# Patient Record
Sex: Male | Born: 1975
Health system: Southern US, Community
[De-identification: ages and names within clinical notes are randomized; demographics above are authoritative.]

## PROBLEM LIST (undated history)

## (undated) DIAGNOSIS — F1910 Other psychoactive substance abuse, uncomplicated: Secondary | ICD-10-CM

## (undated) DIAGNOSIS — I10 Essential (primary) hypertension: Secondary | ICD-10-CM

## (undated) DIAGNOSIS — H3320 Serous retinal detachment, unspecified eye: Secondary | ICD-10-CM

## (undated) DIAGNOSIS — Z9109 Other allergy status, other than to drugs and biological substances: Secondary | ICD-10-CM

## (undated) DIAGNOSIS — L0390 Cellulitis, unspecified: Secondary | ICD-10-CM

## (undated) DIAGNOSIS — E7800 Pure hypercholesterolemia, unspecified: Secondary | ICD-10-CM

## (undated) DIAGNOSIS — K449 Diaphragmatic hernia without obstruction or gangrene: Secondary | ICD-10-CM

## (undated) DIAGNOSIS — M7580 Other shoulder lesions, unspecified shoulder: Secondary | ICD-10-CM

## (undated) HISTORY — PX: EYE SURGERY: SHX253

## (undated) HISTORY — PX: HERNIA REPAIR: SHX51

---

## 2001-07-01 ENCOUNTER — Encounter: Admission: RE | Admit: 2001-07-01 | Discharge: 2001-07-01 | Payer: Self-pay | Admitting: *Deleted

## 2001-07-01 ENCOUNTER — Encounter: Payer: Self-pay | Admitting: *Deleted

## 2014-08-05 ENCOUNTER — Encounter (HOSPITAL_COMMUNITY): Payer: Self-pay | Admitting: *Deleted

## 2014-08-05 ENCOUNTER — Emergency Department (HOSPITAL_COMMUNITY)
Admission: EM | Admit: 2014-08-05 | Discharge: 2014-08-05 | Disposition: A | Discharge status: Home / Self Care | Payer: 59 | Attending: Emergency Medicine | Admitting: Emergency Medicine

## 2014-08-05 DIAGNOSIS — I10 Essential (primary) hypertension: Secondary | ICD-10-CM | POA: Diagnosis not present

## 2014-08-05 DIAGNOSIS — Z8739 Personal history of other diseases of the musculoskeletal system and connective tissue: Secondary | ICD-10-CM | POA: Diagnosis not present

## 2014-08-05 DIAGNOSIS — Z79899 Other long term (current) drug therapy: Secondary | ICD-10-CM | POA: Diagnosis not present

## 2014-08-05 DIAGNOSIS — R42 Dizziness and giddiness: Secondary | ICD-10-CM | POA: Diagnosis present

## 2014-08-05 DIAGNOSIS — E780 Pure hypercholesterolemia: Secondary | ICD-10-CM | POA: Diagnosis not present

## 2014-08-05 DIAGNOSIS — Z7951 Long term (current) use of inhaled steroids: Secondary | ICD-10-CM | POA: Insufficient documentation

## 2014-08-05 DIAGNOSIS — Z7982 Long term (current) use of aspirin: Secondary | ICD-10-CM | POA: Insufficient documentation

## 2014-08-05 DIAGNOSIS — Z8719 Personal history of other diseases of the digestive system: Secondary | ICD-10-CM | POA: Diagnosis not present

## 2014-08-05 HISTORY — DX: Other allergy status, other than to drugs and biological substances: Z91.09

## 2014-08-05 HISTORY — DX: Diaphragmatic hernia without obstruction or gangrene: K44.9

## 2014-08-05 HISTORY — DX: Essential (primary) hypertension: I10

## 2014-08-05 HISTORY — DX: Other shoulder lesions, unspecified shoulder: M75.80

## 2014-08-05 HISTORY — DX: Pure hypercholesterolemia, unspecified: E78.00

## 2014-08-05 LAB — BASIC METABOLIC PANEL
Anion gap: 9 (ref 5–15)
BUN: 13 mg/dL (ref 6–20)
CALCIUM: 9.4 mg/dL (ref 8.9–10.3)
CO2: 26 mmol/L (ref 22–32)
CREATININE: 0.85 mg/dL (ref 0.61–1.24)
Chloride: 104 mmol/L (ref 101–111)
GLUCOSE: 93 mg/dL (ref 65–99)
Potassium: 4 mmol/L (ref 3.5–5.1)
Sodium: 139 mmol/L (ref 135–145)

## 2014-08-05 LAB — CBC
HCT: 42.9 % (ref 39.0–52.0)
HEMOGLOBIN: 15.2 g/dL (ref 13.0–17.0)
MCH: 28.4 pg (ref 26.0–34.0)
MCHC: 35.4 g/dL (ref 30.0–36.0)
MCV: 80.2 fL (ref 78.0–100.0)
Platelets: 231 10*3/uL (ref 150–400)
RBC: 5.35 MIL/uL (ref 4.22–5.81)
RDW: 13.2 % (ref 11.5–15.5)
WBC: 7.6 10*3/uL (ref 4.0–10.5)

## 2014-08-05 LAB — I-STAT TROPONIN, ED: Troponin i, poc: 0 ng/mL (ref 0.00–0.08)

## 2014-08-05 NOTE — ED Notes (Signed)
Pt. Left with all belongings and refused wheelchair 

## 2014-08-05 NOTE — Discharge Instructions (Signed)
EKG here without any significant maladies. Patient's dizziness is improved significantly. Labs without any significant abnormalities. Recommend following up with your regular doctor for blood pressure checks. Return for any new or worse symptoms at all.

## 2014-08-05 NOTE — ED Provider Notes (Signed)
CSN: [id number]     Arrival date & time 08/05/14  2002 History   First MD Initiated Contact with Patient 08/05/14 2057     No chief complaint on file.    (Consider location/radiation/quality/duration/timing/severity/associated sxs/prior Treatment) The history is provided by the patient.   patient with history of hypertension. Strong cardiac history in the family. Patient today was experiencing some dizziness no vertigo. Currently denying your pain here. A little bit of pressure sensation in the face. Is suffering from seasonal allergies. Noted that his blood pressure was running high at home checked several times. Went to urgent care. They felt that the pressure in the face was maybe related allergies. He didn't EKG showed a flipped T-wave in lead 3 and patient was referred here for an abnormal EKG. Patient has no chest pain. Patient overall feels much better currently. Patient's blood pressure is improving. No chest or cardiac symptoms at all no shortness of breath.  Past Medical History  Diagnosis Date  . Hiatal hernia   . Hypertension   . Hypercholesteremia   . Environmental allergies   . AC (acromioclavicular) joint bone spurs    Past Surgical History  Procedure Laterality Date  . Hernia repair     No family history on file. History  Substance Use Topics  . Smoking status: Never Smoker   . Smokeless tobacco: Not on file  . Alcohol Use: Yes     Comment: once per week    Review of Systems  Constitutional: Negative for fever.  HENT: Positive for congestion.   Eyes: Negative for visual disturbance.  Respiratory: Negative for shortness of breath.   Cardiovascular: Negative for chest pain.  Gastrointestinal: Negative for nausea, vomiting, abdominal pain and diarrhea.  Genitourinary: Negative for dysuria.  Musculoskeletal: Negative for back pain and neck pain.  Skin: Negative for rash.  Neurological: Positive for dizziness. Negative for facial asymmetry, speech difficulty,  weakness, numbness and headaches.  Hematological: Does not bruise/bleed easily.  Psychiatric/Behavioral: Negative for confusion.      Allergies  Review of patient's allergies indicates no known allergies.  Home Medications   Prior to Admission medications   Medication Sig Start Date End Date Taking? Authorizing Provider  aspirin EC 81 MG tablet Take 81 mg by mouth daily.   Yes Historical Provider, MD  fexofenadine (ALLEGRA) 180 MG tablet Take 180 mg by mouth daily.   Yes Historical Provider, MD  fluticasone (FLONASE) 50 MCG/ACT nasal spray Place 1 spray into both nostrils 2 (two) times daily.   Yes Historical Provider, MD  Multiple Vitamin (MULTI-VITAMIN PO) Take 1 tablet by mouth daily.   Yes Historical Provider, MD  sertraline (ZOLOFT) 100 MG tablet Take 150 mg by mouth daily.   Yes Historical Provider, MD  simvastatin (ZOCOR) 20 MG tablet Take 20 mg by mouth daily.   Yes Historical Provider, MD  traMADol (ULTRAM-ER) 100 MG 24 hr tablet Take 50 mg by mouth 2 (two) times daily.   Yes Historical Provider, MD  valsartan (DIOVAN) 160 MG tablet Take 160 mg by mouth daily.   Yes Historical Provider, MD   BP 141/89 mmHg  Pulse 83  Temp(Src) 98.2 F (36.8 C) (Oral)  Resp 13  SpO2 99% Physical Exam  Constitutional: He is oriented to person, place, and time. He appears well-developed and well-nourished. No distress.  HENT:  Head: Normocephalic and atraumatic.  Mouth/Throat: Oropharynx is clear and moist.  Eyes: Conjunctivae and EOM are normal. Pupils are equal, round, and reactive to light.  Neck: Normal range of motion.  Cardiovascular: Normal rate, regular rhythm and normal heart sounds.   No murmur heard. Pulmonary/Chest: Effort normal and breath sounds normal. No respiratory distress.  Abdominal: Soft. Bowel sounds are normal. There is no tenderness.  Musculoskeletal: Normal range of motion. He exhibits no edema.  Neurological: He is alert and oriented to person, place, and time.  No cranial nerve deficit. He exhibits normal muscle tone. Coordination normal.  Skin: Skin is warm. No rash noted.  Nursing note and vitals reviewed.   ED Course  Procedures (including critical care time) Labs Review Labs Reviewed  CBC  BASIC METABOLIC PANEL  I-STAT TROPOININ, ED    Imaging Review No results found.   EKG Interpretation   Date/Time:  Wednesday August 05 2014 20:07:18 EDT Ventricular Rate:  79 PR Interval:  154 QRS Duration: 98 QT Interval:  380 QTC Calculation: 435 R Axis:   66 Text Interpretation:  Normal sinus rhythm Normal ECG No previous ECGs  available Confirmed by Nolawi Kanady  MD, Trevonte Ashkar [phone]([id number]) on 08/05/2014 9:10:19 PM      MDM   Final diagnoses:  Essential hypertension  Dizziness    [facility] for concerns on EKG. Patient has a known history of hypertension does have a significant family cardiac history. Patient's blood pressure was running higher today patient was checking it frequently. Most recent blood pressure here actually had a systolic below 140 and diastolic below 90. Patient without any vertigo symptoms. EKG here is normal. Troponin is normal labs are normal. Patient without any significant headache or chest pain. Earlier today patient had some dizziness without vertigo had A funny feeling in the face and had similar to when his blood pressure was high prior to being treated. Denies any chest pain. Patient can follow-up with his regular doctor. Patient given precautions and will return for any development of any new or worse symptoms including chest pain.    Vanetta MuldersScott Naif Alabi, MD 08/05/14 2158

## 2014-08-05 NOTE — ED Notes (Signed)
pt states he was at work and started experiencing dizzy, ear pain. States his BP was 133/96. Checked his BP again and it was 124/101. Pt went to UC. Was told the dizziness was allergy related and had fluid on his ears. UC did and EKG and noted some EKG changes and was sent to ED for further eval. Denies CP.pt states that he was exercising today and as his HR went up his head started hurting as well.

## 2015-01-27 ENCOUNTER — Other Ambulatory Visit: Payer: Self-pay | Admitting: Neurology

## 2015-01-27 MED ORDER — FLUTICASONE PROPIONATE 50 MCG/ACT NA SUSP: 1.0000 | Freq: Two times a day (BID) | NASAL | Status: DC

## 2015-09-24 ENCOUNTER — Other Ambulatory Visit: Payer: Self-pay | Admitting: Allergy and Immunology

## 2015-12-22 ENCOUNTER — Encounter (HOSPITAL_COMMUNITY): Payer: Self-pay | Admitting: Emergency Medicine

## 2015-12-22 DIAGNOSIS — Z7982 Long term (current) use of aspirin: Secondary | ICD-10-CM | POA: Diagnosis not present

## 2015-12-22 DIAGNOSIS — I10 Essential (primary) hypertension: Secondary | ICD-10-CM | POA: Diagnosis present

## 2015-12-22 NOTE — ED Triage Notes (Signed)
Pt states he has a history of htn and is on diavan for same. Pt states he woke up yesterday not feeling well and when he went to the doctor today he still felt bad with an elevated bp. Pt states his doctor upped his medicine and sent him home. Pt states he continued to check his bp throughout the day and his pressure is still elevated. Pt went to lay done when he rechecked his pressure and when it was elevated he called 9-1-1.

## 2015-12-23 ENCOUNTER — Emergency Department (HOSPITAL_COMMUNITY)
Admission: EM | Admit: 2015-12-23 | Discharge: 2015-12-23 | Disposition: A | Discharge status: Home / Self Care | Payer: 59 | Attending: Emergency Medicine | Admitting: Emergency Medicine

## 2015-12-23 ENCOUNTER — Emergency Department (HOSPITAL_COMMUNITY): Payer: 59

## 2015-12-23 DIAGNOSIS — I10 Essential (primary) hypertension: Secondary | ICD-10-CM

## 2015-12-23 LAB — CBC WITH DIFFERENTIAL/PLATELET
Basophils Absolute: 0.1 10*3/uL (ref 0.0–0.1)
Basophils Relative: 1 %
Eosinophils Absolute: 0.1 10*3/uL (ref 0.0–0.7)
Eosinophils Relative: 1 %
HEMATOCRIT: 44.7 % (ref 39.0–52.0)
HEMOGLOBIN: 15.4 g/dL (ref 13.0–17.0)
LYMPHS ABS: 2.2 10*3/uL (ref 0.7–4.0)
LYMPHS PCT: 29 %
MCH: 29.7 pg (ref 26.0–34.0)
MCHC: 34.5 g/dL (ref 30.0–36.0)
MCV: 86.3 fL (ref 78.0–100.0)
Monocytes Absolute: 0.9 10*3/uL (ref 0.1–1.0)
Monocytes Relative: 11 %
NEUTROS ABS: 4.4 10*3/uL (ref 1.7–7.7)
NEUTROS PCT: 58 %
Platelets: 236 10*3/uL (ref 150–400)
RBC: 5.18 MIL/uL (ref 4.22–5.81)
RDW: 14.6 % (ref 11.5–15.5)
WBC: 7.6 10*3/uL (ref 4.0–10.5)

## 2015-12-23 LAB — BASIC METABOLIC PANEL
Anion gap: 8 (ref 5–15)
BUN: 14 mg/dL (ref 6–20)
CHLORIDE: 104 mmol/L (ref 101–111)
CO2: 25 mmol/L (ref 22–32)
Calcium: 9.1 mg/dL (ref 8.9–10.3)
Creatinine, Ser: 0.87 mg/dL (ref 0.61–1.24)
GFR calc Af Amer: >60 mL/min (ref >60)
GFR calc non Af Amer: >60 mL/min (ref >60)
Glucose, Bld: 128 mg/dL — ABNORMAL HIGH (ref 65–99)
POTASSIUM: 3.4 mmol/L — AB (ref 3.5–5.1)
SODIUM: 137 mmol/L (ref 135–145)

## 2015-12-23 MED ORDER — SODIUM CHLORIDE 0.9 % IV BOLUS (SEPSIS)
1000.0000 mL | Freq: Once | INTRAVENOUS | Status: AC
  Administered 2015-12-23: 1000 mL via INTRAVENOUS

## 2015-12-23 NOTE — ED Provider Notes (Signed)
[facility] Provider Note   CSN: [id number] Arrival date & time: 12/22/15  2306  By signing my name below, I, [name], attest that this documentation has been prepared under the direction and in the presence of [name], MD . Electronically Signed: [name], Scribe. 12/23/2015. 3:54 AM.  History   Chief Complaint Chief Complaint  Patient presents with  . Hypertension    The history is provided by the patient. No language interpreter was used.     HPI Comments:  Logan Fisher is a 40 y.o. male who presents to the Emergency Department complaining of elevated BP x a few days. Pt has a h/o HTN that has been well controlled with Diovan for the last 6 years. His Diovan dose was increased recently due to this elevation but he notes he has not yet started taking the new dosage but has been compliant with the old dose. Pt  reports  2 instances of BP readings of 171/110 and several other readings over 155 systolic. Pt reports associated chest tightness when he feels anxious, lightheadedness, and nausea. He denies SOB. Pt has a FHx of MI- father age 52. He is also currently taking Adderall; denies recent change in dose.   Past Medical History:  Diagnosis Date  . AC (acromioclavicular) joint bone spurs   . Environmental allergies   . Hiatal hernia   . Hypercholesteremia   . Hypertension     There are no active problems to display for this patient.   Past Surgical History:  Procedure Laterality Date  . HERNIA REPAIR         Home Medications    Prior to Admission medications   Medication Sig Start Date End Date Taking? Authorizing Provider  aspirin EC 81 MG tablet Take 81 mg by mouth daily.    Historical Provider, MD  fexofenadine (ALLEGRA) 180 MG tablet Take 180 mg by mouth daily.    Historical Provider, MD  fluticasone (FLONASE) 50 MCG/ACT nasal spray Place 1 spray into both nostrils 2 (two) times daily. 01/27/15   Jessica Priest, MD  Multiple Vitamin  (MULTI-VITAMIN PO) Take 1 tablet by mouth daily.    Historical Provider, MD  sertraline (ZOLOFT) 100 MG tablet Take 150 mg by mouth daily.    Historical Provider, MD  simvastatin (ZOCOR) 20 MG tablet Take 20 mg by mouth daily.    Historical Provider, MD  traMADol (ULTRAM-ER) 100 MG 24 hr tablet Take 50 mg by mouth 2 (two) times daily.    Historical Provider, MD  valsartan (DIOVAN) 160 MG tablet Take 160 mg by mouth daily.    Historical Provider, MD    Family History No family history on file.  Social History Social History  Substance Use Topics  . Smoking status: Never Smoker  . Smokeless tobacco: Never Used  . Alcohol use 8.4 oz/week    14 Cans of beer per week     Allergies   Review of patient's allergies indicates no known allergies.   Review of Systems Review of Systems 10 systems reviewed and all are negative for acute change except as noted in the HPI.   Physical Exam Updated Vital Signs BP (!) 150/108 (BP Location: Left Arm)   Pulse 92   Temp 98 F (36.7 C) (Oral)   Resp 19   Ht 6\' 5"  (1.956 m)   Wt 221 lb (100.2 kg)   SpO2 96%   BMI 26.21 kg/m   Physical Exam  Constitutional: He is oriented  to person, place, and time. Vital signs are normal. He appears well-developed and well-nourished.  Non-toxic appearance. He does not appear ill. No distress.  HENT:  Head: Normocephalic and atraumatic.  Nose: Nose normal.  Mouth/Throat: Oropharynx is clear and moist. No oropharyngeal exudate.  Eyes: Conjunctivae and EOM are normal. Pupils are equal, round, and reactive to light. No scleral icterus.  Neck: Normal range of motion. Neck supple. No tracheal deviation, no edema, no erythema and normal range of motion present. No thyroid mass and no thyromegaly present.  Cardiovascular: Normal rate, regular rhythm, S1 normal, S2 normal, normal heart sounds, intact distal pulses and normal pulses.  Exam reveals no gallop and no friction rub.   No murmur heard. Pulmonary/Chest:  Effort normal and breath sounds normal. No respiratory distress. He has no wheezes. He has no rhonchi. He has no rales.  Abdominal: Soft. Normal appearance and bowel sounds are normal. He exhibits no distension, no ascites and no mass. There is no hepatosplenomegaly. There is no tenderness. There is no rebound, no guarding and no CVA tenderness.  Musculoskeletal: Normal range of motion. He exhibits no edema or tenderness.  Lymphadenopathy:    He has no cervical adenopathy.  Neurological: He is alert and oriented to person, place, and time. He has normal strength. No cranial nerve deficit or sensory deficit.  Skin: Skin is warm, dry and intact. No petechiae and no rash noted. He is not diaphoretic. No erythema. No pallor.  Nursing note and vitals reviewed.    ED Treatments / Results  DIAGNOSTIC STUDIES:  Oxygen Saturation is 96% on RA, normal by my interpretation.    COORDINATION OF CARE:  3:53 AM Discussed treatment plan with pt at bedside and pt agreed to plan.  Labs (all labs ordered are listed, but only abnormal results are displayed) Labs Reviewed - No data to display  EKG  EKG Interpretation None       Radiology No results found.  Procedures Procedures (including critical care time)  Medications Ordered in ED Medications - No data to display   Initial Impression / Assessment and Plan / ED Course  I have reviewed the triage vital signs and the nursing notes.  Pertinent labs & imaging results that were available during my care of the patient were reviewed by me and considered in my medical decision making (see chart for details).  Clinical Course    Patient presents to the ED for HTN.  He had a mild episode of CP as well, but history is not consistent with ACS.  EKG is unremarkable.  CXR normal as well.  Patient given IVF and advised to continue his increased dose of home medication.  He admits to poor diet and exercise, he also has gained weight over the years,  likely contributing to elevated BP.  He may need further medication control.  Advised to see PCP within 3 days for close follow up. He appears well and in NAD. VS remain within his normal limits and he is safe for DC.    Final Clinical Impressions(s) / ED Diagnoses   Final diagnoses:  None    New Prescriptions New Prescriptions   No medications on file     I personally performed the services described in this documentation, which was scribed in my presence. The recorded information has been reviewed and is accurate.      Tomasita CrumbleAdeleke Genesis Novosad, MD 12/23/15 [phone]

## 2015-12-23 NOTE — ED Notes (Signed)
Pt provided with d/c instructions at this time. Pt verbalizes understanding of d/c instructions as well as follow up procedure after d/c. No new RX at time of d/c.  Pt in no apparent distress at this time. Pt ambulatory at time of d/c.   

## 2015-12-23 NOTE — ED Notes (Signed)
Pt reports a hx of HTN that had been well controlled on diovan.  Pt reports that he has had elevated bp for the last several days. Pt states that his bp was 170/113 when he called EMS.

## 2016-08-05 ENCOUNTER — Emergency Department (HOSPITAL_COMMUNITY)
Admission: EM | Admit: 2016-08-05 | Discharge: 2016-08-05 | Disposition: A | Discharge status: Home / Self Care | Payer: 59 | Attending: Emergency Medicine | Admitting: Emergency Medicine

## 2016-08-05 ENCOUNTER — Encounter (HOSPITAL_COMMUNITY): Payer: Self-pay

## 2016-08-05 DIAGNOSIS — E7800 Pure hypercholesterolemia, unspecified: Secondary | ICD-10-CM | POA: Diagnosis not present

## 2016-08-05 DIAGNOSIS — Z79899 Other long term (current) drug therapy: Secondary | ICD-10-CM | POA: Insufficient documentation

## 2016-08-05 DIAGNOSIS — I10 Essential (primary) hypertension: Secondary | ICD-10-CM | POA: Insufficient documentation

## 2016-08-05 DIAGNOSIS — N481 Balanitis: Secondary | ICD-10-CM | POA: Diagnosis not present

## 2016-08-05 DIAGNOSIS — N489 Disorder of penis, unspecified: Secondary | ICD-10-CM | POA: Diagnosis present

## 2016-08-05 DIAGNOSIS — N4889 Other specified disorders of penis: Secondary | ICD-10-CM

## 2016-08-05 LAB — CBC WITH DIFFERENTIAL/PLATELET
Basophils Absolute: 0 10*3/uL (ref 0.0–0.1)
Basophils Relative: 0 %
EOS ABS: 0 10*3/uL (ref 0.0–0.7)
EOS PCT: 0 %
HCT: 41.4 % (ref 39.0–52.0)
HEMOGLOBIN: 14.1 g/dL (ref 13.0–17.0)
LYMPHS ABS: 1.2 10*3/uL (ref 0.7–4.0)
LYMPHS PCT: 7 %
MCH: 29.6 pg (ref 26.0–34.0)
MCHC: 34.1 g/dL (ref 30.0–36.0)
MCV: 86.8 fL (ref 78.0–100.0)
Monocytes Absolute: 1.5 10*3/uL — ABNORMAL HIGH (ref 0.1–1.0)
Monocytes Relative: 9 %
NEUTROS PCT: 84 %
Neutro Abs: 14.3 10*3/uL — ABNORMAL HIGH (ref 1.7–7.7)
Platelets: 237 10*3/uL (ref 150–400)
RBC: 4.77 MIL/uL (ref 4.22–5.81)
RDW: 13 % (ref 11.5–15.5)
WBC: 17.1 10*3/uL — ABNORMAL HIGH (ref 4.0–10.5)

## 2016-08-05 LAB — COMPREHENSIVE METABOLIC PANEL
ALK PHOS: 54 U/L (ref 38–126)
ALT: 33 U/L (ref 17–63)
AST: 24 U/L (ref 15–41)
Albumin: 3.6 g/dL (ref 3.5–5.0)
Anion gap: 10 (ref 5–15)
BUN: 19 mg/dL (ref 6–20)
CALCIUM: 9.3 mg/dL (ref 8.9–10.3)
CO2: 25 mmol/L (ref 22–32)
CREATININE: 0.93 mg/dL (ref 0.61–1.24)
Chloride: 100 mmol/L — ABNORMAL LOW (ref 101–111)
GFR calc Af Amer: >60 mL/min (ref >60)
GFR calc non Af Amer: >60 mL/min (ref >60)
Glucose, Bld: 147 mg/dL — ABNORMAL HIGH (ref 65–99)
Potassium: 3.5 mmol/L (ref 3.5–5.1)
Sodium: 135 mmol/L (ref 135–145)
Total Bilirubin: 0.8 mg/dL (ref 0.3–1.2)
Total Protein: 6.6 g/dL (ref 6.5–8.1)

## 2016-08-05 LAB — I-STAT CG4 LACTIC ACID, ED: Lactic Acid, Venous: 1.62 mmol/L (ref 0.5–1.9)

## 2016-08-05 MED ORDER — HYDROCODONE-ACETAMINOPHEN 5-325 MG PO TABS: 1.0000 | ORAL_TABLET | Freq: Four times a day (QID) | ORAL | 0 refills | Status: DC | PRN

## 2016-08-05 MED ORDER — SODIUM CHLORIDE 0.9 % IV BOLUS (SEPSIS)
1000.0000 mL | Freq: Once | INTRAVENOUS | Status: AC
  Administered 2016-08-05: 1000 mL via INTRAVENOUS

## 2016-08-05 MED ORDER — MORPHINE SULFATE (PF) 4 MG/ML IV SOLN
4.0000 mg | Freq: Once | INTRAVENOUS | Status: AC
  Administered 2016-08-05: 4 mg via INTRAVENOUS
  Filled 2016-08-05: qty 1

## 2016-08-05 MED ORDER — CLINDAMYCIN HCL 300 MG PO CAPS: 300.0000 mg | ORAL_CAPSULE | Freq: Four times a day (QID) | ORAL | 0 refills | Status: DC

## 2016-08-05 MED ORDER — CLINDAMYCIN PHOSPHATE 600 MG/50ML IV SOLN
600.0000 mg | Freq: Once | INTRAVENOUS | Status: AC
  Administered 2016-08-05: 600 mg via INTRAVENOUS
  Filled 2016-08-05: qty 50

## 2016-08-05 NOTE — ED Triage Notes (Signed)
Pt states she is coming to be checked for increased penile swelling; pt states he has been dx and treated but swelling is causing him not to be able to urinate; Pt states pain at 8/10 on arrival.

## 2016-08-05 NOTE — Discharge Instructions (Signed)
Take clindamycin as prescribed.   Take tylenol, motrin for pain.   Take vicodin for severe pain.   Apply ice to the penis to help with swelling.   See urologist and dermatologist  Return to ER if you are unable to urinate, severe pain, worse penile redness and swelling, fevers.

## 2016-08-05 NOTE — ED Notes (Signed)
Updated on wait for treatment room. 

## 2016-08-05 NOTE — ED Notes (Signed)
IV removed.

## 2016-08-05 NOTE — ED Provider Notes (Signed)
[facility] Provider Note   CSN: [id number] Arrival date & time: 08/05/16  0129     History   Chief Complaint Chief Complaint  Patient presents with  . Groin Swelling    HPI Logan Fisher is a 41 y.o. male history hypertension, high cholesterol, penile swelling for several months here presenting with worsening penile swelling. Patient states that he has been swelling for several months are ready and has seen urology and dermatology for this. About a week ago, he had a biopsy of part of his penis and had some bleeding and had stitches taken out. He states that since yesterday his swelling got much worse so he has to went back to see dermatology. He was then sent to his urologist who saw him yesterday and had an ultrasound in the office that showed possible orchitis. Patient was started on Levaquin but states that since then his swelling has gotten much worse. Patient states that he is able to urinate but has pain when he urinates. Denies any fevers or chills.   The history is provided by the patient.    Past Medical History:  Diagnosis Date  . AC (acromioclavicular) joint bone spurs   . Environmental allergies   . Hiatal hernia   . Hypercholesteremia   . Hypertension     There are no active problems to display for this patient.   Past Surgical History:  Procedure Laterality Date  . HERNIA REPAIR         Home Medications    Prior to Admission medications   Medication Sig Start Date End Date Taking? Authorizing Provider  amphetamine-dextroamphetamine (ADDERALL XR) 25 MG 24 hr capsule Take 25 mg by mouth 2 (two) times daily.   Yes [provider]  aspirin EC 81 MG tablet Take 81 mg by mouth daily.   Yes [provider]  fexofenadine (ALLEGRA) 180 MG tablet Take 180 mg by mouth daily.   Yes [provider]  levofloxacin (LEVAQUIN) 500 MG tablet Take 500 mg by mouth daily. 08/04/16  Yes [provider]  Multiple Vitamin  (MULTIVITAMIN WITH MINERALS) TABS tablet Take 1 tablet by mouth daily.   Yes [provider]  sertraline (ZOLOFT) 100 MG tablet Take 100 mg by mouth daily.    Yes [provider]  simvastatin (ZOCOR) 20 MG tablet Take 20 mg by mouth daily.   Yes [provider]  spironolactone (ALDACTONE) 25 MG tablet Take 25 mg by mouth daily.   Yes [provider]  valsartan (DIOVAN) 320 MG tablet Take 320 mg by mouth daily. 07/24/16  Yes [provider]  VIAGRA 100 MG tablet Take 100 mg by mouth as needed for erectile dysfunction.  06/09/16  Yes [provider]  clindamycin (CLEOCIN) 300 MG capsule Take 1 capsule (300 mg total) by mouth 4 (four) times daily. X 7 days 08/05/16   Charlynne PanderYao, David Hsienta, MD  fluticasone [facility](FLONASE) 50 MCG/ACT nasal spray Place 1 spray into both nostrils 2 (two) times daily. Patient not taking: Reported on 08/05/2016 01/27/15   Jessica PriestKozlow, Eric J, MD  HYDROcodone-acetaminophen (NORCO/VICODIN) 5-325 MG tablet Take 1-2 tablets by mouth every 6 (six) hours as needed. 08/05/16   Charlynne PanderYao, David Hsienta, MD    Family History No family history on file.  Social History Social History  Substance Use Topics  . Smoking status: Never Smoker  . Smokeless tobacco: Never Used  . Alcohol use 8.4 oz/week    14 Cans of beer per week  Allergies   Patient has no known allergies.   Review of Systems Review of Systems  Genitourinary: Positive for penile swelling.  All other systems reviewed and are negative.    Physical Exam Updated Vital Signs BP 133/84 (BP Location: Right Arm)   Pulse (!) 110   Temp 98 F (36.7 C) (Oral)   Resp 17   SpO2 98%   Physical Exam  Constitutional: He is oriented to person, place, and time.  Uncomfortable   HENT:  Head: Normocephalic.  Mouth/Throat: Oropharynx is clear and moist.  Eyes: Conjunctivae and EOM are normal. Pupils are equal, round, and reactive to light.  Neck: Normal range of motion. Neck supple.    Cardiovascular: Normal rate, regular rhythm and normal heart sounds.   Pulmonary/Chest: Effort normal and breath sounds normal. No respiratory distress. He has no wheezes. He has no rales.  Abdominal: Soft. Bowel sounds are normal. He exhibits no distension. There is no tenderness. There is no guarding.  Genitourinary:  Genitourinary Comments: Circumcised. Foreskin swollen and tenderness and slightly red. No scrotal swelling or tenderness.   Musculoskeletal: Normal range of motion.  Neurological: He is alert and oriented to person, place, and time.  Skin: Skin is warm.  Psychiatric: He has a normal mood and affect.  Nursing note and vitals reviewed.    ED Treatments / Results  Labs (all labs ordered are listed, but only abnormal results are displayed) Labs Reviewed  CBC WITH DIFFERENTIAL/PLATELET - Abnormal; Notable for the following:       Result Value   WBC 17.1 (*)    Neutro Abs 14.3 (*)    Monocytes Absolute 1.5 (*)    All other components within normal limits  COMPREHENSIVE METABOLIC PANEL - Abnormal; Notable for the following:    Chloride 100 (*)    Glucose, Bld 147 (*)    All other components within normal limits  CULTURE, BLOOD (ROUTINE X 2)  CULTURE, BLOOD (ROUTINE X 2)  URINALYSIS, ROUTINE W REFLEX MICROSCOPIC  I-STAT CG4 LACTIC ACID, ED    EKG  EKG Interpretation None       Radiology No results found.  Procedures Procedures (including critical care time)  Medications Ordered in ED Medications  morphine 4 MG/ML injection 4 mg (4 mg Intravenous Given 08/05/16 0616)  clindamycin (CLEOCIN) IVPB 600 mg (600 mg Intravenous New Bag/Given 08/05/16 0616)  sodium chloride 0.9 % bolus 1,000 mL (1,000 mLs Intravenous New Bag/Given 08/05/16 0620)     Initial Impression / Assessment and Plan / ED Course  I have reviewed the triage vital signs and the nursing notes.  Pertinent labs & imaging results that were available during my care of the patient were reviewed by me  and considered in my medical decision making (see chart for details).    Logan Fisher is a 41 y.o. male here with penile swelling. Patient circumcised and I don't think he has paraphimosis. The penile swelling is acute on chronic and he has seen urology and dermatology yesterday. I think likely bad ballanitis vs inflammation after biopsy. I doubt fournier's gangrene. He is tachycardic in the ED but not febrile. Will get labs, lactate. Will give IVF and abx.   6:40 am WBC 17. Lactate nl. Tachycardia improved. I called Dr. Sherryl Barters from urology, who knows him well. I agrees with switching to clindamycin. He states that patient is circumcised and doubt paraphimosis. He highly doubt infection as patient has seen urology multiple times in the past for this. He thinks  that it will get better and will see patient as follow up outpatient. Will dc home with clinda, vicodin, ice to penis. Gave strict return precautions.      Final Clinical Impressions(s) / ED Diagnoses   Final diagnoses:  Balanitis  Penile swelling    New Prescriptions New Prescriptions   CLINDAMYCIN (CLEOCIN) 300 MG CAPSULE    Take 1 capsule (300 mg total) by mouth 4 (four) times daily. X 7 days   HYDROCODONE-ACETAMINOPHEN (NORCO/VICODIN) 5-325 MG TABLET    Take 1-2 tablets by mouth every 6 (six) hours as needed.     Charlynne Pander, MD 08/05/16 [phone]

## 2016-08-10 LAB — CULTURE, BLOOD (ROUTINE X 2)
CULTURE: NO GROWTH
Culture: NO GROWTH
SPECIAL REQUESTS: ADEQUATE
Special Requests: ADEQUATE

## 2017-04-24 ENCOUNTER — Emergency Department (HOSPITAL_BASED_OUTPATIENT_CLINIC_OR_DEPARTMENT_OTHER)
Admission: EM | Admit: 2017-04-24 | Discharge: 2017-04-24 | Disposition: A | Discharge status: Home / Self Care | Payer: 59 | Attending: Emergency Medicine | Admitting: Emergency Medicine

## 2017-04-24 ENCOUNTER — Encounter (HOSPITAL_BASED_OUTPATIENT_CLINIC_OR_DEPARTMENT_OTHER): Payer: Self-pay | Admitting: Emergency Medicine

## 2017-04-24 ENCOUNTER — Other Ambulatory Visit: Payer: Self-pay

## 2017-04-24 DIAGNOSIS — M62838 Other muscle spasm: Secondary | ICD-10-CM | POA: Diagnosis not present

## 2017-04-24 DIAGNOSIS — Z79899 Other long term (current) drug therapy: Secondary | ICD-10-CM | POA: Insufficient documentation

## 2017-04-24 DIAGNOSIS — I10 Essential (primary) hypertension: Secondary | ICD-10-CM | POA: Diagnosis not present

## 2017-04-24 DIAGNOSIS — M25511 Pain in right shoulder: Secondary | ICD-10-CM | POA: Diagnosis not present

## 2017-04-24 DIAGNOSIS — M542 Cervicalgia: Secondary | ICD-10-CM | POA: Diagnosis present

## 2017-04-24 MED ORDER — METHOCARBAMOL 500 MG PO TABS
1000.0000 mg | ORAL_TABLET | Freq: Once | ORAL | Status: AC
  Administered 2017-04-24: 1000 mg via ORAL
  Filled 2017-04-24: qty 2

## 2017-04-24 MED ORDER — METHOCARBAMOL 500 MG PO TABS: 500.0000 mg | ORAL_TABLET | Freq: Two times a day (BID) | ORAL | 0 refills | Status: DC

## 2017-04-24 MED ORDER — IBUPROFEN 600 MG PO TABS: 600.0000 mg | ORAL_TABLET | Freq: Four times a day (QID) | ORAL | 0 refills | Status: DC | PRN

## 2017-04-24 NOTE — ED Triage Notes (Signed)
Right sided neck pain, woke up with muscle tension.  Pt relates he was in the car all day and felt stressed.  Tried massage but not improving.

## 2017-04-24 NOTE — ED Provider Notes (Signed)
[facility] EMERGENCY DEPARTMENT Provider Note   CSN: [id number] Arrival date & time: 04/24/17  [id number]     History   Chief Complaint Chief Complaint  Patient presents with  . Neck Pain    HPI Logan HomansJames O Maltos is a 42 y.o. male with history of hypertension, hypocholesterolemia who presents with a 1 day history of right shoulder and right-sided neck pain that began upon awakening yesterday.  The day prior, patient was in the car all day and stress.  He relates his symptoms to holding tension in his neck.  He has taken ibuprofen, use ice, heat, and massage without relief.  He has had some radiation of pain down his arm and intermittent tingling in his hand.  He has had some right-sided thoracic pain, but no midline back pain.  He denies any numbness or tingling in his legs or saddle anesthesia.  He denies any fever, known cancer, history of procedures to his back, history of IVDU.  He denies any other complaints.  HPI  Past Medical History:  Diagnosis Date  . AC (acromioclavicular) joint bone spurs   . Environmental allergies   . Hiatal hernia   . Hypercholesteremia   . Hypertension     There are no active problems to display for this patient.   Past Surgical History:  Procedure Laterality Date  . HERNIA REPAIR         Home Medications    Prior to Admission medications   Medication Sig Start Date End Date Taking? Authorizing Provider  ibuprofen (ADVIL,MOTRIN) 600 MG tablet Take 1 tablet (600 mg total) by mouth every 6 (six) hours as needed. 04/24/17   Yvana Samonte, Waylan BogaAlexandra M, PA-C  methocarbamol (ROBAXIN) 500 MG tablet Take 1 tablet (500 mg total) by mouth 2 (two) times daily. 04/24/17   Gazella Anglin, Waylan BogaAlexandra M, PA-C  VIAGRA 100 MG tablet Take 100 mg by mouth as needed for erectile dysfunction.  06/09/16   [provider]    Family History No family history on file.  Social History Social History   Tobacco Use  . Smoking status: Never Smoker  . Smokeless tobacco:  Never Used  Substance Use Topics  . Alcohol use: Yes    Alcohol/week: 8.4 oz    Types: 14 Cans of beer per week  . Drug use: No     Allergies   Patient has no known allergies.   Review of Systems Review of Systems  Constitutional: Negative for chills and fever.  HENT: Negative for facial swelling and sore throat.   Respiratory: Negative for shortness of breath.   Cardiovascular: Negative for chest pain.  Gastrointestinal: Negative for abdominal pain, nausea and vomiting.  Musculoskeletal: Positive for back pain and neck pain.  Skin: Negative for rash and wound.  Neurological: Positive for numbness (RUE paresthesia intermittent) and headaches (R side temporal).  Psychiatric/Behavioral: The patient is not nervous/anxious.      Physical Exam Updated Vital Signs BP (!) 156/111 (BP Location: Right Arm)   Pulse (!) 102   Temp 98.6 F (37 C) (Oral)   Resp 18   Ht 6\' 5"  (1.956 m)   Wt 91 kg (200 lb 9.9 oz)   SpO2 98%   BMI 23.79 kg/m   Physical Exam  Constitutional: He appears well-developed and well-nourished. No distress.  HENT:  Head: Normocephalic and atraumatic.  Mouth/Throat: Oropharynx is clear and moist. No oropharyngeal exudate.  Eyes: Conjunctivae are normal. Pupils are equal, round, and reactive to light. Right eye exhibits  no discharge. Left eye exhibits no discharge. No scleral icterus.  Neck: Normal range of motion. Neck supple. Muscular tenderness present. No spinous process tenderness present. No thyromegaly present.    Cardiovascular: Regular rhythm, normal heart sounds and intact distal pulses. Exam reveals no gallop and no friction rub.  No murmur heard. Pulmonary/Chest: Effort normal and breath sounds normal. No stridor. No respiratory distress. He has no wheezes. He has no rales.  Musculoskeletal: He exhibits no edema.       Cervical back: He exhibits tenderness and spasm (R upper trapezius). He exhibits no bony tenderness.       Thoracic back: He  exhibits tenderness. He exhibits no bony tenderness.       Back:  No midline cervical, thoracic, or lumbar tenderness 5/5 strength to bilateral upper extremities; normal sensation; equal bilateral grip strength  Lymphadenopathy:    He has no cervical adenopathy.  Neurological: He is alert. Coordination normal.  Skin: Skin is warm and dry. No rash noted. He is not diaphoretic. No pallor.  Psychiatric: He has a normal mood and affect.  Nursing note and vitals reviewed.    ED Treatments / Results  Labs (all labs ordered are listed, but only abnormal results are displayed) Labs Reviewed - No data to display  EKG  EKG Interpretation None       Radiology No results found.  Procedures Procedures (including critical care time)  Medications Ordered in ED Medications - No data to display   Initial Impression / Assessment and Plan / ED Course  I have reviewed the triage vital signs and the nursing notes.  Pertinent labs & imaging results that were available during my care of the patient were reviewed by me and considered in my medical decision making (see chart for details).     Patient with muscle spasm of right upper trapezius.  Patient is neurovascularly intact.  No midline spinal tenderness.  Will treat supportively with stretching, ice, heat, ibuprofen, and muscle relaxer.  Follow-up to PCP or sports medicine for further evaluation if symptoms are not improving.  Return precautions discussed.  Patient understands and agrees with plan.  Patient vitals stable and discharged in satisfactory condition.  Final Clinical Impressions(s) / ED Diagnoses   Final diagnoses:  Trapezius muscle spasm    ED Discharge Orders        Ordered    methocarbamol (ROBAXIN) 500 MG tablet  2 times daily     04/24/17 1137    ibuprofen (ADVIL,MOTRIN) 600 MG tablet  Every 6 hours PRN     04/24/17 [address] [place], PA-C 04/24/17 1138    Cathren Laine, MD 04/24/17 1237

## 2017-04-24 NOTE — Discharge Instructions (Signed)
Medications: Ibuprofen, Robaxin  Treatment: Take ibuprofen every 6 hours as prescribed for the next few days.  Take Robaxin twice daily as needed for muscle pain or spasms.  Do not drive or operate machinery while taking this medication.  Use ice and heat 3-4 times daily alternating 20 minutes on, 20 minutes off.  Attempt the stretches 3-4 times daily as tolerated.  Follow-up: Please follow-up with your doctor or the sports medicine doctor below if your symptoms are not improving over the next 1-2 weeks.  Please return to the emergency department if you develop any new or worsening symptoms.

## 2017-04-25 ENCOUNTER — Encounter (HOSPITAL_BASED_OUTPATIENT_CLINIC_OR_DEPARTMENT_OTHER): Payer: Self-pay

## 2017-04-25 ENCOUNTER — Emergency Department (HOSPITAL_BASED_OUTPATIENT_CLINIC_OR_DEPARTMENT_OTHER)
Admission: EM | Admit: 2017-04-25 | Discharge: 2017-04-25 | Disposition: A | Discharge status: Home / Self Care | Payer: 59 | Attending: Emergency Medicine | Admitting: Emergency Medicine

## 2017-04-25 ENCOUNTER — Other Ambulatory Visit: Payer: Self-pay

## 2017-04-25 DIAGNOSIS — M62838 Other muscle spasm: Secondary | ICD-10-CM | POA: Diagnosis not present

## 2017-04-25 DIAGNOSIS — Z79899 Other long term (current) drug therapy: Secondary | ICD-10-CM | POA: Insufficient documentation

## 2017-04-25 DIAGNOSIS — I10 Essential (primary) hypertension: Secondary | ICD-10-CM | POA: Insufficient documentation

## 2017-04-25 DIAGNOSIS — M25511 Pain in right shoulder: Secondary | ICD-10-CM | POA: Diagnosis present

## 2017-04-25 MED ORDER — DEXAMETHASONE SODIUM PHOSPHATE 10 MG/ML IJ SOLN
INTRAMUSCULAR | Status: AC
  Filled 2017-04-25: qty 1

## 2017-04-25 MED ORDER — DIAZEPAM 5 MG PO TABS
5.0000 mg | ORAL_TABLET | Freq: Once | ORAL | Status: AC
  Administered 2017-04-25: 5 mg via ORAL
  Filled 2017-04-25: qty 1

## 2017-04-25 MED ORDER — METHYLPREDNISOLONE ACETATE 40 MG/ML IJ SUSP
40.0000 mg | Freq: Once | INTRAMUSCULAR | Status: DC
  Filled 2017-04-25: qty 1

## 2017-04-25 MED ORDER — KETOROLAC TROMETHAMINE 60 MG/2ML IM SOLN
60.0000 mg | Freq: Once | INTRAMUSCULAR | Status: AC
  Administered 2017-04-25: 60 mg via INTRAMUSCULAR
  Filled 2017-04-25: qty 2

## 2017-04-25 MED ORDER — LIDOCAINE HCL 2 % IJ SOLN
20.0000 mL | Freq: Once | INTRAMUSCULAR | Status: DC
  Filled 2017-04-25: qty 20

## 2017-04-25 MED ORDER — DIAZEPAM 5 MG PO TABS: 5.0000 mg | ORAL_TABLET | Freq: Three times a day (TID) | ORAL | 0 refills | Status: DC | PRN

## 2017-04-25 MED ORDER — DEXAMETHASONE SODIUM PHOSPHATE 10 MG/ML IJ SOLN
10.0000 mg | Freq: Once | INTRAMUSCULAR | Status: AC
Start: 2017-04-25 — End: 2017-04-25
  Administered 2017-04-25: 02:00:00 via INTRAVENOUS

## 2017-04-25 NOTE — ED Triage Notes (Signed)
Pt was seen earlier today and states his pain has gotten worse, and his right pinky and right ring finger keep going numb

## 2017-04-25 NOTE — ED Provider Notes (Signed)
[facility] EMERGENCY DEPARTMENT Provider Note   CSN: [id number] Arrival date & time: 04/25/17  0049     History   Chief Complaint Chief Complaint  Patient presents with  . Shoulder Pain    HPI Logan Fisher is a 42 y.o. male.  Patient presents to the emergency department for evaluation of right-sided shoulder pain.  Patient reports that he had slow onset of pain in the right shoulder area couple of days ago while driving and tonight the pain became quite severe.  He was seen for this once before, prescribed a muscle relaxer but it has not helped.  Patient reports constant pain that significantly worsens if he raises his arm.  Tonight the pain started radiating down his arm and he had some tingling in his third and fourth fingers.  No weakness in the hand or upper extremity.  No injury.      Past Medical History:  Diagnosis Date  . AC (acromioclavicular) joint bone spurs   . Environmental allergies   . Hiatal hernia   . Hypercholesteremia   . Hypertension     There are no active problems to display for this patient.   Past Surgical History:  Procedure Laterality Date  . HERNIA REPAIR         Home Medications    Prior to Admission medications   Medication Sig Start Date End Date Taking? Authorizing Provider  diazepam (VALIUM) 5 MG tablet Take 1 tablet (5 mg total) by mouth every 8 (eight) hours as needed for muscle spasms. 04/25/17   Gilda Crease, MD  ibuprofen (ADVIL,MOTRIN) 600 MG tablet Take 1 tablet (600 mg total) by mouth every 6 (six) hours as needed. 04/24/17   Law, Waylan Boga, PA-C  methocarbamol (ROBAXIN) 500 MG tablet Take 1 tablet (500 mg total) by mouth 2 (two) times daily. 04/24/17   Law, Waylan Boga, PA-C  VIAGRA 100 MG tablet Take 100 mg by mouth as needed for erectile dysfunction.  06/09/16   [provider]    Family History No family history on file.  Social History Social History   Tobacco Use  . Smoking status:  Never Smoker  . Smokeless tobacco: Never Used  Substance Use Topics  . Alcohol use: Yes    Alcohol/week: 8.4 oz    Types: 14 Cans of beer per week  . Drug use: No     Allergies   Patient has no known allergies.   Review of Systems Review of Systems  Musculoskeletal: Positive for neck pain.  All other systems reviewed and are negative.    Physical Exam Updated Vital Signs BP (!) 150/111 (BP Location: Left Arm)   Pulse (!) 115   Temp 99.3 F (37.4 C) (Oral)   Resp 20   Ht 6\' 5"  (1.956 m)   Wt 90.7 kg (200 lb)   SpO2 100%   BMI 23.72 kg/m   Physical Exam  Constitutional: He is oriented to person, place, and time. He appears well-developed and well-nourished. No distress.  HENT:  Head: Normocephalic and atraumatic.  Right Ear: Hearing normal.  Left Ear: Hearing normal.  Nose: Nose normal.  Mouth/Throat: Oropharynx is clear and moist and mucous membranes are normal.  Eyes: Conjunctivae and EOM are normal. Pupils are equal, round, and reactive to light.  Neck: Normal range of motion. Neck supple.  Cardiovascular: Regular rhythm, S1 normal and S2 normal. Exam reveals no gallop and no friction rub.  No murmur heard. Pulmonary/Chest: Effort normal and  breath sounds normal. No respiratory distress. He exhibits no tenderness.  Abdominal: Soft. Normal appearance and bowel sounds are normal. There is no hepatosplenomegaly. There is no tenderness. There is no rebound, no guarding, no tenderness at McBurney's point and negative Murphy's sign. No hernia.  Musculoskeletal: Normal range of motion.       Right shoulder: He exhibits tenderness.       Arms: Significant spasm of the posterior aspect of trapezius muscle with point tenderness  Decreased range of motion of the shoulder secondary to painful inhibition  Normal strength and sensation of upper extremity  Neurological: He is alert and oriented to person, place, and time. He has normal strength. No cranial nerve deficit or  sensory deficit. Coordination normal. GCS eye subscore is 4. GCS verbal subscore is 5. GCS motor subscore is 6.  Skin: Skin is warm, dry and intact. No rash noted. No cyanosis.  Psychiatric: He has a normal mood and affect. His speech is normal and behavior is normal. Thought content normal.  Nursing note and vitals reviewed.    ED Treatments / Results  Labs (all labs ordered are listed, but only abnormal results are displayed) Labs Reviewed - No data to display  EKG  EKG Interpretation None       Radiology No results found.  Procedures (trigger point injection) ORTHOPEDIC INJURY TREATMENT Date/Time: 04/25/2017 2:11 AM Performed by: Gilda Crease, MD Authorized by: Gilda Crease, MD   Consent:    Consent obtained:  Verbal   Consent given by:  Patient   Risks discussed:  Stiffness Universal protocol:    Procedure explained and questions answered to patient or proxy's satisfaction: yes     Site/side marked: yes     Immediately prior to procedure a time out was called: yes     Patient identity confirmed:  Verbally with patientInjury location: shoulder Location details: right shoulder Injury type: soft tissue Pre-procedure neurovascular assessment: neurovascularly intact  Anesthesia: Local anesthesia used: yes (Trigger point area was identified.  10 mg of Decadron was mixed with 9 mL of 2% lidocaine.  Trigger point was injected with 2 mL.  4 spots surrounding the area of trigger point were also injected with 2 mL each.  Patient had relief.)  Patient sedated: NoPost-procedure neurovascular assessment: post-procedure neurovascularly intact    (including critical care time)  Medications Ordered in ED Medications  lidocaine (XYLOCAINE) 2 % (with pres) injection 400 mg (not administered)  dexamethasone (DECADRON) injection 10 mg (not administered)  ketorolac (TORADOL) injection 60 mg (not administered)  diazepam (VALIUM) tablet 5 mg (not administered)      Initial Impression / Assessment and Plan / ED Course  I have reviewed the triage vital signs and the nursing notes.  Pertinent labs & imaging results that were available during my care of the patient were reviewed by me and considered in my medical decision making (see chart for details).     Patient presents with persistent and worsening pain.  He now has a radicular component.  Pain, however, is maximal in the trapezius region, no midline posterior neck pain.  This does not appear to be a cervical radiculopathy, patient has significant spasm of the muscles over the brachial plexus area causing the radiculopathy.  There was some areas of point tenderness, these areas were injected with Decadron and lidocaine with improvement.  Patient will continue high-dose anti-inflammatory and was prescribed Valium.  Final Clinical Impressions(s) / ED Diagnoses   Final diagnoses:  Acute pain of  right shoulder  Muscle spasm    ED Discharge Orders        Ordered    diazepam (VALIUM) 5 MG tablet  Every 8 hours PRN     04/25/17 0213       Gilda CreasePollina, Chaselyn Nanney J, MD 04/25/17 [phone]

## 2017-12-12 ENCOUNTER — Other Ambulatory Visit: Payer: Self-pay

## 2017-12-12 ENCOUNTER — Emergency Department (HOSPITAL_BASED_OUTPATIENT_CLINIC_OR_DEPARTMENT_OTHER)
Admission: EM | Admit: 2017-12-12 | Discharge: 2017-12-12 | Disposition: A | Discharge status: Home / Self Care | Payer: 59 | Attending: Emergency Medicine | Admitting: Emergency Medicine

## 2017-12-12 ENCOUNTER — Encounter (HOSPITAL_BASED_OUTPATIENT_CLINIC_OR_DEPARTMENT_OTHER): Payer: Self-pay

## 2017-12-12 ENCOUNTER — Emergency Department (HOSPITAL_COMMUNITY): Payer: 59

## 2017-12-12 ENCOUNTER — Emergency Department (HOSPITAL_BASED_OUTPATIENT_CLINIC_OR_DEPARTMENT_OTHER): Payer: 59

## 2017-12-12 DIAGNOSIS — I10 Essential (primary) hypertension: Secondary | ICD-10-CM | POA: Insufficient documentation

## 2017-12-12 DIAGNOSIS — L0390 Cellulitis, unspecified: Secondary | ICD-10-CM

## 2017-12-12 DIAGNOSIS — L03115 Cellulitis of right lower limb: Secondary | ICD-10-CM | POA: Diagnosis not present

## 2017-12-12 DIAGNOSIS — Z79899 Other long term (current) drug therapy: Secondary | ICD-10-CM | POA: Insufficient documentation

## 2017-12-12 DIAGNOSIS — M79671 Pain in right foot: Secondary | ICD-10-CM | POA: Diagnosis present

## 2017-12-12 DIAGNOSIS — T1490XA Injury, unspecified, initial encounter: Secondary | ICD-10-CM

## 2017-12-12 LAB — CBC WITH DIFFERENTIAL/PLATELET
ABS IMMATURE GRANULOCYTES: 0.02 10*3/uL (ref 0.00–0.07)
BASOS PCT: 1 %
Basophils Absolute: 0.1 10*3/uL (ref 0.0–0.1)
EOS PCT: 1 %
Eosinophils Absolute: 0.1 10*3/uL (ref 0.0–0.5)
HCT: 40.4 % (ref 39.0–52.0)
HEMOGLOBIN: 13.4 g/dL (ref 13.0–17.0)
Immature Granulocytes: 0 %
LYMPHS PCT: 20 %
Lymphs Abs: 1.5 10*3/uL (ref 0.7–4.0)
MCH: 27.7 pg (ref 26.0–34.0)
MCHC: 33.2 g/dL (ref 30.0–36.0)
MCV: 83.5 fL (ref 80.0–100.0)
MONOS PCT: 14 %
Monocytes Absolute: 1.1 10*3/uL — ABNORMAL HIGH (ref 0.1–1.0)
NEUTROS ABS: 5.1 10*3/uL (ref 1.7–7.7)
Neutrophils Relative %: 64 %
Platelets: 284 10*3/uL (ref 150–400)
RBC: 4.84 MIL/uL (ref 4.22–5.81)
RDW: 13.4 % (ref 11.5–15.5)
WBC: 7.9 10*3/uL (ref 4.0–10.5)
nRBC: 0 % (ref 0.0–0.2)

## 2017-12-12 LAB — BASIC METABOLIC PANEL
Anion gap: 9 (ref 5–15)
BUN: 12 mg/dL (ref 6–20)
CHLORIDE: 104 mmol/L (ref 98–111)
CO2: 26 mmol/L (ref 22–32)
CREATININE: 0.95 mg/dL (ref 0.61–1.24)
Calcium: 8.8 mg/dL — ABNORMAL LOW (ref 8.9–10.3)
GFR calc Af Amer: >60 mL/min (ref >60)
GFR calc non Af Amer: >60 mL/min (ref >60)
Glucose, Bld: 128 mg/dL — ABNORMAL HIGH (ref 70–99)
POTASSIUM: 3.3 mmol/L — AB (ref 3.5–5.1)
Sodium: 139 mmol/L (ref 135–145)

## 2017-12-12 MED ORDER — CEPHALEXIN 500 MG PO CAPS: 500.0000 mg | ORAL_CAPSULE | Freq: Four times a day (QID) | ORAL | 0 refills | Status: DC

## 2017-12-12 MED ORDER — CEPHALEXIN 250 MG PO CAPS
500.0000 mg | ORAL_CAPSULE | Freq: Once | ORAL | Status: AC
  Administered 2017-12-12: 500 mg via ORAL
  Filled 2017-12-12: qty 2

## 2017-12-12 MED FILL — CEPHALEXIN 500 MG CAPSULE: 500 | 7 days supply | Qty: 28 | Fill #0

## 2017-12-12 NOTE — Discharge Instructions (Signed)
Please read and follow all provided instructions.  Your diagnoses today include:  1. Cellulitis of right foot   2. Cellulitis   3. Injury     Tests performed today include:  Vital signs. See below for your results today.   White blood cell count -was normal  Blood counts and electrolytes  X-ray of the foot -is normal  Medications prescribed:   Keflex (cephalexin) - antibiotic  You have been prescribed an antibiotic medicine: take the entire course of medicine even if you are feeling better. Stopping early can cause the antibiotic not to work.  Take any prescribed medications only as directed.   Home care instructions:  Follow any educational materials contained in this packet. Keep affected area above the level of your heart when possible. Wash area gently twice a day with warm soapy water. Do not apply alcohol or hydrogen peroxide. Cover the area if it draining or weeping.   Follow-up instructions: Return to the Emergency Department or see your doctor in 48 hours for a recheck.  Return instructions:  Return to the Emergency Department if you have:  Fever  Worsening symptoms  Worsening pain  Worsening swelling  Redness of the skin that moves away from the affected area, especially if it streaks away from the affected area   Any other emergent concerns  Your vital signs today were: BP (!) 133/95 (BP Location: Right Arm)    Pulse (!) 118    Temp 98.4 F (36.9 C) (Oral)    Resp 18    Ht 6\' 5"  (1.956 m)    Wt 86.2 kg    SpO2 97%    BMI 22.53 kg/m  If your blood pressure (BP) was elevated above 135/85 this visit, please have this repeated by your doctor within one month. --------------

## 2017-12-12 NOTE — ED Provider Notes (Signed)
[facility] EMERGENCY DEPARTMENT Provider Note   CSN: [id number] Arrival date & time: 12/12/17  1546     History   Chief Complaint Chief Complaint  Patient presents with  . Foot Pain    HPI Logan Fisher is a 42 y.o. male.  Patient presents to the emergency department today with complaint of acute onset of right foot redness and pain.  Patient states that he dropped a shampoo bottle on his foot yesterday.  When he woke up this morning he noted swelling and pain to the entire foot and into the ankle.  The skin is red as well.  It is very warm.  Patient denies any fevers, nausea or vomiting.  He has no history of diabetes or immunocompromise.  No treatments prior to arrival.  Patient is ambulatory but with some discomfort.  Pain is not made worse with bending.  No history of gout.     Past Medical History:  Diagnosis Date  . AC (acromioclavicular) joint bone spurs   . Environmental allergies   . Hiatal hernia   . Hypercholesteremia   . Hypertension     There are no active problems to display for this patient.   Past Surgical History:  Procedure Laterality Date  . HERNIA REPAIR          Home Medications    Prior to Admission medications   Medication Sig Start Date End Date Taking? Authorizing Provider  cephALEXin (KEFLEX) 500 MG capsule Take 1 capsule (500 mg total) by mouth 4 (four) times daily. 12/12/17   Renne Crigler, PA-C  diazepam (VALIUM) 5 MG tablet Take 1 tablet (5 mg total) by mouth every 8 (eight) hours as needed for muscle spasms. 04/25/17   Gilda Crease, MD  ibuprofen (ADVIL,MOTRIN) 600 MG tablet Take 1 tablet (600 mg total) by mouth every 6 (six) hours as needed. 04/24/17   Law, Waylan Boga, PA-C  methocarbamol (ROBAXIN) 500 MG tablet Take 1 tablet (500 mg total) by mouth 2 (two) times daily. 04/24/17   Law, Waylan Boga, PA-C  VIAGRA 100 MG tablet Take 100 mg by mouth as needed for erectile dysfunction.  06/09/16   [provider]    Family History No family history on file.  Social History Social History   Tobacco Use  . Smoking status: Never Smoker  . Smokeless tobacco: Never Used  Substance Use Topics  . Alcohol use: Yes    Comment: weekly  . Drug use: No     Allergies   Patient has no known allergies.   Review of Systems Review of Systems  Constitutional: Negative for activity change.  Musculoskeletal: Positive for myalgias. Negative for arthralgias, back pain, gait problem, joint swelling and neck pain.  Skin: Positive for color change. Negative for wound.  Neurological: Negative for weakness and numbness.     Physical Exam Updated Vital Signs BP (!) 133/95 (BP Location: Right Arm)   Pulse (!) 118   Temp 98.4 F (36.9 C) (Oral)   Resp 18   Ht 6\' 5"  (1.956 m)   Wt 86.2 kg   SpO2 97%   BMI 22.53 kg/m   Physical Exam  Constitutional: He appears well-developed and well-nourished.  HENT:  Head: Normocephalic and atraumatic.  Eyes: Conjunctivae are normal.  Neck: Normal range of motion. Neck supple.  Cardiovascular: Normal pulses. Exam reveals no decreased pulses.  Musculoskeletal: He exhibits tenderness. He exhibits no edema.       Right knee: Normal. He exhibits  normal range of motion and no swelling.       Right ankle: He exhibits swelling. He exhibits normal range of motion. No tenderness.       Right lower leg: He exhibits no tenderness, no bony tenderness and no swelling.       Legs: Neurological: He is alert. No sensory deficit.  Motor, sensation, and vascular distal to the injury is fully intact.   Skin: Skin is warm and dry.  Psychiatric: He has a normal mood and affect.  Nursing note and vitals reviewed.    ED Treatments / Results  Labs (all labs ordered are listed, but only abnormal results are displayed) Labs Reviewed  CBC WITH DIFFERENTIAL/PLATELET - Abnormal; Notable for the following components:      Result Value   Monocytes Absolute 1.1 (*)    All other  components within normal limits  BASIC METABOLIC PANEL - Abnormal; Notable for the following components:   Potassium 3.3 (*)    Glucose, Bld 128 (*)    Calcium 8.8 (*)    All other components within normal limits    EKG None  Radiology Dg Foot Complete Right  Result Date: 12/12/2017 CLINICAL DATA:  Patient dropped shampoo bottle on the right foot today. EXAM: RIGHT FOOT COMPLETE - 3+ VIEW COMPARISON:  None. FINDINGS: There is no evidence of fracture or dislocation. There is no evidence of arthropathy or other focal bone abnormality. Soft tissues are unremarkable. IMPRESSION: Negative. Electronically Signed   By: Sherian Rein M.D.   On: 12/12/2017 17:00    Procedures Procedures (including critical care time)  Medications Ordered in ED Medications  cephALEXin (KEFLEX) capsule 500 mg (500 mg Oral Given 12/12/17 1710)     Initial Impression / Assessment and Plan / ED Course  I have reviewed the triage vital signs and the nursing notes.  Pertinent labs & imaging results that were available during my care of the patient were reviewed by me and considered in my medical decision making (see chart for details).     Patient seen and examined. Work-up initiated. Medications ordered.   Vital signs reviewed and are as follows: BP (!) 133/95 (BP Location: Right Arm)   Pulse (!) 118   Temp 98.4 F (36.9 C) (Oral)   Resp 18   Ht 6\' 5"  (1.956 m)   Wt 86.2 kg   SpO2 97%   BMI 22.53 kg/m   Lab work-up and x-ray are reassuring.  Patient stable.  Heart rate improved.  He has received first dose of Keflex here in emergency department.  Patient is to follow-up with his primary care doctor or return in the next 48 hours for recheck.  We discussed signs and symptoms to return sooner including fever, worsening pain, streaking redness up the leg.  Patient seems reliable to return with worsening.  Final Clinical Impressions(s) / ED Diagnoses   Final diagnoses:  Cellulitis of right foot    Patient presents with findings suggestive of cellulitis of the right foot with mild extension onto the right ankle.  This does not appear to be gouty arthritis.  He has good range of motion I have low concern for joint infection.  Patient does not have signs of DVT.  He is low risk for more severe infection and does not have a history of diabetes or immunocompromise.  No leukocytosis, normal appearing imaging.  No signs of necrotizing fasciitis on exam or by x-ray.  Feel comfortable with discharged home with close follow-up.  Patient  started on antibiotics.  ED Discharge Orders         Ordered    cephALEXin (KEFLEX) 500 MG capsule  4 times daily     12/12/17 1733           Renne Crigler, PA-C 12/12/17 1746    Tilden Fossa, MD 12/13/17 [phone]

## 2017-12-12 NOTE — ED Triage Notes (Signed)
Pt states he droped shampoo bottle on right great toe yesterday-now having pain/redness to right foot/ankle-pt to triage in w/c-drove self to ED-NAD

## 2017-12-12 NOTE — ED Notes (Signed)
Pt family at bedside. No concerns at this time. Updated with plan of care.

## 2017-12-28 ENCOUNTER — Emergency Department (HOSPITAL_COMMUNITY)
Admission: EM | Admit: 2017-12-28 | Discharge: 2017-12-28 | Disposition: A | Discharge status: Home / Self Care | Payer: 59 | Attending: Emergency Medicine | Admitting: Emergency Medicine

## 2017-12-28 ENCOUNTER — Emergency Department (HOSPITAL_COMMUNITY): Payer: 59

## 2017-12-28 DIAGNOSIS — J181 Lobar pneumonia, unspecified organism: Secondary | ICD-10-CM | POA: Diagnosis not present

## 2017-12-28 DIAGNOSIS — Z79899 Other long term (current) drug therapy: Secondary | ICD-10-CM | POA: Insufficient documentation

## 2017-12-28 DIAGNOSIS — T401X1A Poisoning by heroin, accidental (unintentional), initial encounter: Secondary | ICD-10-CM | POA: Diagnosis not present

## 2017-12-28 DIAGNOSIS — I10 Essential (primary) hypertension: Secondary | ICD-10-CM | POA: Diagnosis not present

## 2017-12-28 DIAGNOSIS — J189 Pneumonia, unspecified organism: Secondary | ICD-10-CM

## 2017-12-28 DIAGNOSIS — R079 Chest pain, unspecified: Secondary | ICD-10-CM | POA: Insufficient documentation

## 2017-12-28 LAB — I-STAT TROPONIN, ED
TROPONIN I, POC: 0.01 ng/mL (ref 0.00–0.08)
TROPONIN I, POC: 0.02 ng/mL (ref 0.00–0.08)

## 2017-12-28 LAB — BASIC METABOLIC PANEL
Anion gap: 7 (ref 5–15)
BUN: 14 mg/dL (ref 6–20)
CHLORIDE: 103 mmol/L (ref 98–111)
CO2: 27 mmol/L (ref 22–32)
CREATININE: 1.08 mg/dL (ref 0.61–1.24)
Calcium: 9 mg/dL (ref 8.9–10.3)
Glucose, Bld: 135 mg/dL — ABNORMAL HIGH (ref 70–99)
Potassium: 3.8 mmol/L (ref 3.5–5.1)
SODIUM: 137 mmol/L (ref 135–145)

## 2017-12-28 LAB — CBC
HEMATOCRIT: 44.2 % (ref 39.0–52.0)
Hemoglobin: 14.5 g/dL (ref 13.0–17.0)
MCH: 27.1 pg (ref 26.0–34.0)
MCHC: 32.8 g/dL (ref 30.0–36.0)
MCV: 82.6 fL (ref 80.0–100.0)
NRBC: 0 % (ref 0.0–0.2)
PLATELETS: 286 10*3/uL (ref 150–400)
RBC: 5.35 MIL/uL (ref 4.22–5.81)
RDW: 13.2 % (ref 11.5–15.5)
WBC: 6.3 10*3/uL (ref 4.0–10.5)

## 2017-12-28 MED ORDER — AZITHROMYCIN 250 MG PO TABS
500.0000 mg | ORAL_TABLET | Freq: Once | ORAL | Status: AC
  Administered 2017-12-28: 500 mg via ORAL
  Filled 2017-12-28: qty 2

## 2017-12-28 MED ORDER — SODIUM CHLORIDE 0.9 % IV SOLN
1.0000 g | Freq: Once | INTRAVENOUS | Status: AC
  Administered 2017-12-28: 1 g via INTRAVENOUS
  Filled 2017-12-28: qty 10

## 2017-12-28 MED ORDER — AZITHROMYCIN 250 MG PO TABS: 250.0000 mg | ORAL_TABLET | Freq: Every day | ORAL | 0 refills | Status: DC

## 2017-12-28 NOTE — Discharge Instructions (Addendum)
Substance Abuse Treatment Programs ° °Intensive Outpatient Programs °[facility]     °[address]      °[place], [place]                   °[phone]      ° °[facility] °[address] °[place], [place] °[phone] ° °[facility]     °(Inpatient and outpatient)     °[address]           °[phone]   ° °[facility] °[phone] (Suboxone and Methadone) ° °[address]      °[place], [place] [address]      °[phone]      ° °[address] °[place], [place] °852-3033 ° °[facility] (Outpatient/Inpatient, Chemical)    °(insurance only) [phone]      °       °[facility] (Groups & Residential) °[place], [place] °[phone] ° °   °[facility]     °[address]     °[place], [place]      °[phone]      ° °[facility] (for caregivers and family) °[address] °[place], [place] °336-299-4655 ° ° ° ° ° °Residential Treatment Programs °[facility]      °[address], [place], [place] [address]  °[phone]      ° °[facility] °[address] [place], [place] [address] °[phone] ° °[facility]        °[phone]      ° °[facility] °[phone] ° °[facility]             °[address]                                         °[place], [place]                                                °[phone] or [phone]                              ° °[facility] [place] °[address] °[place], [address] °[phone] ° °[facility]    °[address]      °, [place]     °[phone]      ° °[facility] °[address] °, [place] °[phone] ° °[facility]   °[address]     °[place], [place] [address]     °[phone]      °Admissions: 8am-3pm M-F ° °[facility] °[address] °[place],  [place] °[phone] ° °[facility]: Residential Program (90 Days)   °[place], [place]      °[phone] or [phone]    ° °[facility] °[place], [place] °(Walk in Hours over the weekend or by referral) ° °[facility] °[address], [place], [place] [address] °([phone]) [phone] ° °Crisis Mobile: [facility]:  1-877-626-1772 (for crisis response 24 hours a day) °[facility] Hotline:      1-800-256-2452 °Outpatient Psychiatry and Counseling ° °[facility]: Mobile Crisis   Management 24 hours:  1-579-867-5796  [facility] of the [employer] sliding scale fee and walk in schedule: M-F 8am-12pm/1pm-3pm [address]  [place], [place] [address] [place], [place] [address] [phone]  [facility] ([facility])- new patient walk-in appointments available Monday - Friday 8am -3pm.          [address] [place], [place] [address] [phone] or crisis line- [facility]/ Intensive Outpatient Therapy Program [place], [place] [address] [place]      [phone] [place], [place] [address]                 [place]   [facility] [phone] [place], [place] [address]   [employer]          [address] Johnette Abraham  [place], [place] [address]       [phone]  [facility] [address] [place], [place] [address] [phone]  [facility]    [address] [place], [place] [address]     [place], [place] Joycelyn Man     [place] [address]     [phone]       [facility] [place] [address]  [facility]     [address], [place], [place], [place] [address] [place]     [address]     [place], [place] [address]     [phone]       [facility] [address] [place], [place] [address] [phone] Resources for Temporary Residential Assistance/Crisis [facility]) M-F 8am-3pm   [address] [place], [place] [address]   [phone] Services include: laundry, barbering, support groups, case management, phone  & computer access, showers, AA/NA mtgs, mental health/substance abuse nurse, job skills class, disability information, VA assistance, spiritual classes, etc.   [facility]   [address], [place]     [place]              [employer] (women and children)       [place]. [place], [place] [address] [phone] [email or web address] for application and process Application Required  [facility]   [address]    [place] [address]     [phone]                    [place],  [address] [phone](OILNZVJK application appt.) Application Required  [facility] (women only)    [address]     [place],  [address]     [phone]  Intake starts 6pm daily Need valid ID, SSC, & Police report [employer] [address] [place], [place] [phone] Application Required  [facility] (men only)     [address]      [place], [place]     [phone]       [facility] [place] (Pregnant women only) [address] [place], [place] [phone]  [facility]      [address]      [place], [place] [address]     [phone]             [facility] [address] [place], [place] [phone] 90 day commitment/SA/Application process  [facility](men only)     [address]     [place], [place]     [phone]       Check-in at [facility] [address] [place], [place] [address] [phone] Men/Women/Women and Children must be there by 7 pm  [facility] [place], [place] [phone]                Your testing has been normal except for the chest xray Avoid using heroin See the below information for substance abuse counseling ER for worsening symptoms. Your xray did show that you have a small pneumonia. Zithromax daily for 5 days

## 2017-12-28 NOTE — ED Notes (Signed)
MD notified  Of COWS score

## 2017-12-28 NOTE — ED Notes (Signed)
Patient verbalizes understanding of discharge instructions. Opportunity for questioning and answers were provided. Pt discharged from ED. 

## 2017-12-28 NOTE — ED Provider Notes (Signed)
[facility] EMERGENCY DEPARTMENT Provider Note   CSN: [id number] Arrival date & time: 12/28/17  0803   History   Chief Complaint Chief Complaint  Patient presents with  . Drug Overdose  . Chest Pain    HPI Logan Fisher is a 42 y.o. male.  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  This is caused him to lose weight.  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  The patient states "we have a ton of Narcan at home".  He reports that he is now back to his usual self, he had a very quick return of breathing after the Narcan was given, paramedics found the patient awake.  It was reported that he did get some brief CPR while paramedics were on the way.  He was having no chest pain prior to overdosing, when he came around he was having some chest discomfort which is worse with breathing, bilateral, no radiation, no associated shortness of breath coughing fever swelling of the legs or any other symptoms.  This does get worse with deep breathing.  He has no history of cardiac disease.  Past Medical History:  Diagnosis Date  . AC (acromioclavicular) joint bone spurs   . Environmental allergies   . Hiatal hernia   . Hypercholesteremia   . Hypertension     There are no active problems to display for this patient.   Past Surgical History:  Procedure Laterality Date  . HERNIA REPAIR          Home Medications    Prior to Admission medications   Medication Sig Start Date End Date Taking? Authorizing Provider  ibuprofen (ADVIL,MOTRIN) 600 MG tablet Take 1 tablet (600 mg total) by mouth every 6  (six) hours as needed. Patient taking differently: Take 400 mg by mouth every 6 (six) hours as needed for headache.  04/24/17  Yes Law, Waylan Boga, PA-C  azithromycin (ZITHROMAX Z-PAK) 250 MG tablet Take 1 tablet (250 mg total) by mouth daily. 500mg  PO day 1, then 250mg  PO days 205 12/28/17   Eber Hong, MD    Family History No family history on file.  Social History Social History   Tobacco Use  . Smoking status: Never Smoker  . Smokeless tobacco: Never Used  Substance Use Topics  . Alcohol use: Yes    Comment: weekly  . Drug use: No     Allergies   Patient has no known allergies.   Review of Systems Review of Systems  All other systems reviewed and are negative.    Physical Exam Updated Vital Signs BP (!) 141/104 (BP Location: Right Arm)   Pulse 84   Temp 98.6 F (37 C) (Oral)   Resp 16   SpO2 99%   Physical Exam  Constitutional: He appears well-developed and well-nourished. No distress.  HENT:  Head: Normocephalic and atraumatic.  Mouth/Throat: Oropharynx is clear and moist. No oropharyngeal exudate.  Eyes: Pupils are equal, round, and reactive to light. Conjunctivae and EOM are normal. Right eye exhibits no discharge. Left eye exhibits no discharge. No scleral icterus.  Neck: Normal range of motion. Neck supple. No JVD present. No thyromegaly  present.  Cardiovascular: Regular rhythm, normal heart sounds and intact distal pulses. Tachycardia present. Exam reveals no gallop and no friction rub.  No murmur heard. Pulmonary/Chest: Effort normal and breath sounds normal. No respiratory distress. He has no wheezes. He has no rales.  Abdominal: Soft. Bowel sounds are normal. He exhibits no distension and no mass. There is no tenderness.  Musculoskeletal: Normal range of motion. He exhibits no edema or tenderness.  Lymphadenopathy:    He has no cervical adenopathy.  Neurological: He is alert. Coordination normal.  Skin: Skin is warm and dry. No rash noted. No  erythema.  Psychiatric: He has a normal mood and affect. His behavior is normal.  Nursing note and vitals reviewed.   ED Treatments / Results  Labs (all labs ordered are listed, but only abnormal results are displayed) Labs Reviewed  BASIC METABOLIC PANEL - Abnormal; Notable for the following components:      Result Value   Glucose, Bld 135 (*)    All other components within normal limits  CBC  I-STAT TROPONIN, ED  I-STAT TROPONIN, ED    EKG EKG Interpretation  Date/Time:  Friday December 28 2017 08:05:31 EDT Ventricular Rate:  105 PR Interval:    QRS Duration: 97 QT Interval:  347 QTC Calculation: 459 R Axis:   87 Text Interpretation:  Sinus tachycardia Consider right atrial enlargement Since last tracing rate faster Confirmed by Eber Hong ([id number]) on 12/28/2017 8:36:59 AM   EKG Interpretation  Date/Time:  Friday December 28 2017 11:09:19 EDT Ventricular Rate:  92 PR Interval:    QRS Duration: 97 QT Interval:  374 QTC Calculation: 463 R Axis:   77 Text Interpretation:  Sinus rhythm Normal ECG Since last tracing rate slower Confirmed by Eber Hong ([id number]) on 12/28/2017 11:22:26 AM        Radiology Dg Chest 2 View  Result Date: 12/28/2017 CLINICAL DATA:  Chest pain.  Heroin overdose. EXAM: CHEST - 2 VIEW COMPARISON:  Chest x-ray dated March 23, 2016. FINDINGS: The heart size and mediastinal contours are within normal limits. Normal pulmonary vascularity. New patchy opacities in the right upper lobe. No pleural effusion or pneumothorax. No acute osseous abnormality. IMPRESSION: 1. Right upper lobe airspace disease which could reflect aspiration pneumonia given clinical history. Electronically Signed   By: Obie Dredge M.D.   On: 12/28/2017 09:31    Procedures Procedures (including critical care time)  Medications Ordered in ED Medications  cefTRIAXone (ROCEPHIN) 1 g in sodium chloride 0.9 % 100 mL IVPB (0 g Intravenous Stopped 12/28/17 1137)    azithromycin (ZITHROMAX) tablet 500 mg (500 mg Oral Given 12/28/17 1105)     Initial Impression / Assessment and Plan / ED Course  I have reviewed the triage vital signs and the nursing notes.  Pertinent labs & imaging results that were available during my care of the patient were reviewed by me and considered in my medical decision making (see chart for details).  Clinical Course as of Dec 29 1311  Fri Dec 28, 2017  1004 I personally looked at the two-view chest x-ray, my interpretation is that there is a right upper lobe infiltrate which could be consistent with pneumonia, thing for the patient has no leukocytosis, no fever, no tachycardia.  There may have been a slight aspiration event, otherwise the patient has no other symptoms.  Will treat as community acquired pneumonia.  Initial troponin negative, second troponin pending   [BM]  1006 Rocephin, Zithromax, second troponin pending   [  BM]  1307 Second troponin is negative, stable for discharge   [BM]    Clinical Course User Index [BM] Eber Hong, MD   Has cP after OD / CPR - he is well appearing but mildly tachycardic He is awake and alert and oxygenating well, he does not appear to have any sequela from the overdose at this time.  The chest pain may be related to chest wall injury from the CPR, he may be related to anxiety but also may be related to his increased risk of underlying cardiovascular disease secondary to his history of hypertension hypercholesterolemia and his use of stimulants.  Chest x-ray is unremarkable other than a mild tachycardia, troponin pending, we will keep him on a cardiac monitor and monitor for decline in his respiratory status though he does not have any long-acting opiates in his system according to his report.  In the past he had been through [facility] but left AGAINST MEDICAL ADVICE, he does not state that he wants help with that at this time.  The patient was informed of his results, 2  troponins are negative, 2 EKGs are normal, doubt cardiac cause of chest pain, will treat for community acquired pneumonia, patient agreeable  Final Clinical Impressions(s) / ED Diagnoses   Final diagnoses:  Chest pain at rest  Accidental overdose of heroin, initial encounter [facility])  Community acquired pneumonia of right lung, unspecified part of lung    ED Discharge Orders         Ordered    azithromycin (ZITHROMAX Z-PAK) 250 MG tablet  Daily     12/28/17 1312           Eber Hong, MD 12/28/17 1313

## 2017-12-28 NOTE — ED Triage Notes (Signed)
To ED via [facility] from home- called to house for heroin overdose- pt had injected "normal amount" of heroin and girlfriend found him "convulsing with eyes rolled back in head"  Pt received Narcan 4mg  intranasally per girlfriend - alert/oriented x 4 on EMS arrival--  C/o difficulty breathing at present. Chest tightness "I can't get a good breath"

## 2018-03-06 DIAGNOSIS — M869 Osteomyelitis, unspecified: Secondary | ICD-10-CM

## 2018-03-06 HISTORY — DX: Osteomyelitis, unspecified: M86.9

## 2018-03-19 ENCOUNTER — Emergency Department (HOSPITAL_COMMUNITY)
Admission: EM | Admit: 2018-03-19 | Discharge: 2018-03-20 | Disposition: A | Discharge status: Home / Self Care | Payer: 59 | Attending: Emergency Medicine | Admitting: Emergency Medicine

## 2018-03-19 ENCOUNTER — Encounter (HOSPITAL_COMMUNITY): Payer: Self-pay

## 2018-03-19 DIAGNOSIS — L03116 Cellulitis of left lower limb: Secondary | ICD-10-CM | POA: Insufficient documentation

## 2018-03-19 DIAGNOSIS — I10 Essential (primary) hypertension: Secondary | ICD-10-CM | POA: Insufficient documentation

## 2018-03-19 HISTORY — DX: Cellulitis, unspecified: L03.90

## 2018-03-19 LAB — CBC WITH DIFFERENTIAL/PLATELET
Abs Immature Granulocytes: 0 10*3/uL (ref 0.00–0.07)
BASOS ABS: 0.1 10*3/uL (ref 0.0–0.1)
Basophils Relative: 1 %
Eosinophils Absolute: 0.1 10*3/uL (ref 0.0–0.5)
Eosinophils Relative: 1 %
HCT: 43.8 % (ref 39.0–52.0)
Hemoglobin: 14.2 g/dL (ref 13.0–17.0)
IMMATURE GRANULOCYTES: 0 %
LYMPHS ABS: 2.2 10*3/uL (ref 0.7–4.0)
Lymphocytes Relative: 39 %
MCH: 26.4 pg (ref 26.0–34.0)
MCHC: 32.4 g/dL (ref 30.0–36.0)
MCV: 81.4 fL (ref 80.0–100.0)
Monocytes Absolute: 1.3 10*3/uL — ABNORMAL HIGH (ref 0.1–1.0)
Monocytes Relative: 23 %
NEUTROS PCT: 36 %
NRBC: 0 % (ref 0.0–0.2)
Neutro Abs: 2.1 10*3/uL (ref 1.7–7.7)
PLATELETS: 308 10*3/uL (ref 150–400)
RBC: 5.38 MIL/uL (ref 4.22–5.81)
RDW: 13 % (ref 11.5–15.5)
WBC: 5.7 10*3/uL (ref 4.0–10.5)

## 2018-03-19 LAB — COMPREHENSIVE METABOLIC PANEL
ALK PHOS: 78 U/L (ref 38–126)
ALT: 22 U/L (ref 0–44)
ANION GAP: 9 (ref 5–15)
AST: 25 U/L (ref 15–41)
Albumin: 3.6 g/dL (ref 3.5–5.0)
BUN: 11 mg/dL (ref 6–20)
CALCIUM: 8.9 mg/dL (ref 8.9–10.3)
CHLORIDE: 101 mmol/L (ref 98–111)
CO2: 28 mmol/L (ref 22–32)
Creatinine, Ser: 0.82 mg/dL (ref 0.61–1.24)
GFR calc non Af Amer: >60 mL/min (ref >60)
Glucose, Bld: 120 mg/dL — ABNORMAL HIGH (ref 70–99)
Potassium: 3.8 mmol/L (ref 3.5–5.1)
Sodium: 138 mmol/L (ref 135–145)
Total Bilirubin: 0.7 mg/dL (ref 0.3–1.2)
Total Protein: 7.4 g/dL (ref 6.5–8.1)

## 2018-03-19 LAB — I-STAT CG4 LACTIC ACID, ED: LACTIC ACID, VENOUS: 0.79 mmol/L (ref 0.5–1.9)

## 2018-03-19 MED ORDER — CEPHALEXIN 250 MG PO CAPS
500.0000 mg | ORAL_CAPSULE | Freq: Once | ORAL | Status: AC
  Administered 2018-03-20: 500 mg via ORAL
  Filled 2018-03-19: qty 2

## 2018-03-19 NOTE — ED Provider Notes (Signed)
[facility]  CSN: [id number] Arrival date & time: 03/19/18 2014  Chief Complaint(s) Cellulitis  HPI Logan HomansJames O Plunk is a 43 y.o. male with a history of IV drug use who presents to the emergency department with 3 days of gradually worsening left leg swelling and redness which has now become painful that is worse with touch.  Patient reports similar presentation several months ago due to cellulitis of the right foot.  Denies any fevers or chills.  Denies any trauma.  No prior history of DVT/PEs.  No personal history of cancer.  No clotting disorders.  He does have a reported history of psoriasis.   HPI  Past Medical History Past Medical History:  Diagnosis Date  . AC (acromioclavicular) joint bone spurs   . Cellulitis   . Environmental allergies   . Hiatal hernia   . Hypercholesteremia   . Hypertension    There are no active problems to display for this patient.  Home Medication(s) Prior to Admission medications   Medication Sig Start Date End Date Taking? Authorizing Provider  ibuprofen (ADVIL,MOTRIN) 200 MG tablet Take 400 mg by mouth every 6 (six) hours as needed (for pain or headaches).    Yes [provider]  azithromycin (ZITHROMAX Z-PAK) 250 MG tablet Take 1 tablet (250 mg total) by mouth daily. 500mg  PO day 1, then 250mg  PO days 205 Patient not taking: Reported on 03/19/2018 12/28/17   Eber HongMiller, Brian, MD  cephALEXin (KEFLEX) 500 MG capsule Take 1 capsule (500 mg total) by mouth 2 (two) times daily for 10 days. 03/20/18 03/30/18  Nira Connardama, Pedro Eduardo, MD  ibuprofen (ADVIL,MOTRIN) 600 MG tablet Take 1 tablet (600 mg total) by mouth every 6 (six) hours as needed. Patient not taking: Reported on 03/19/2018 04/24/17   Emi HolesLaw, Alexandra M, PA-C                                                                                                                                    Past Surgical History Past Surgical History:    Procedure Laterality Date  . HERNIA REPAIR     Family History No family history on file.  Social History Social History   Tobacco Use  . Smoking status: Never Smoker  . Smokeless tobacco: Never Used  Substance Use Topics  . Alcohol use: Yes    Comment: weekly  . Drug use: No   Allergies Patient has no known allergies.  Review of Systems Review of Systems All other systems are reviewed and are negative for acute change except as noted in the HPI  Physical Exam Vital Signs  I have reviewed the triage vital signs BP (!) 158/102   Pulse (!) 105   Temp 98.9 F (37.2 C) (Oral)   Resp 20   SpO2 96%   Physical Exam Vitals signs reviewed.  Constitutional:      General: He is not in  acute distress.    Appearance: He is well-developed. He is not diaphoretic.  HENT:     Head: Normocephalic and atraumatic.      Nose: Nose normal.  Eyes:     General: No scleral icterus.       Right eye: No discharge.        Left eye: No discharge.     Conjunctiva/sclera: Conjunctivae normal.     Pupils: Pupils are equal, round, and reactive to light.  Neck:     Musculoskeletal: Normal range of motion and neck supple.  Cardiovascular:     Rate and Rhythm: Normal rate and regular rhythm.     Pulses:          Posterior tibial pulses are 2+ on the right side and 2+ on the left side.     Heart sounds: No murmur. No friction rub. No gallop.   Pulmonary:     Effort: Pulmonary effort is normal. No respiratory distress.     Breath sounds: Normal breath sounds. No stridor. No rales.  Abdominal:     General: There is no distension.     Palpations: Abdomen is soft.     Tenderness: There is no abdominal tenderness.  Musculoskeletal:     Left lower leg: He exhibits tenderness and swelling.       Legs:  Skin:    General: Skin is warm and dry.     Findings: No erythema or rash.     Comments: No Janeway lesion or osler's nodes  Neurological:     Mental Status: He is alert and oriented to  person, place, and time.     ED Results and Treatments Labs (all labs ordered are listed, but only abnormal results are displayed) Labs Reviewed  COMPREHENSIVE METABOLIC PANEL - Abnormal; Notable for the following components:      Result Value   Glucose, Bld 120 (*)    All other components within normal limits  CBC WITH DIFFERENTIAL/PLATELET - Abnormal; Notable for the following components:   Monocytes Absolute 1.3 (*)    All other components within normal limits  I-STAT CG4 LACTIC ACID, ED  I-STAT CG4 LACTIC ACID, ED                                                                                                                         EKG  EKG Interpretation  Date/Time:    Ventricular Rate:    PR Interval:    QRS Duration:   QT Interval:    QTC Calculation:   R Axis:     Text Interpretation:        Radiology No results found. Pertinent labs & imaging results that were available during my care of the patient were reviewed by me and considered in my medical decision making (see chart for details).  Medications Ordered in ED Medications  cephALEXin (KEFLEX) capsule 500 mg (500 mg Oral Given 03/20/18 0008)  Procedures Procedures  (including critical care time)  Medical Decision Making / ED Course I have reviewed the nursing notes for this encounter and the patient's prior records (if available in EHR or on provided paperwork).    Left leg swelling and redness most suspicious for cellulitis.  Patient admitted to IV drug use and has a history of reported psoriasis putting him at risk for possible DVT though I feel this is less likely.  Will treat with oral antibiotics given the lack of evidence of severe infection with no leukocytosis or elevated lactic acid.  We will have patient return in the morning for ultrasound to rule out DVT.  Patient  was provided with outpatient resources for substance abuse rehab.  Instructed to follow-up with primary care provider.  Final Clinical Impression(s) / ED Diagnoses Final diagnoses:  Left leg cellulitis   Disposition: Discharge  Condition: Good  I have discussed the results, Dx and Tx plan with the patient who expressed understanding and agree(s) with the plan. Discharge instructions discussed at great length. The patient was given strict return precautions who verbalized understanding of the instructions. No further questions at time of discharge.    ED Discharge Orders         Ordered    cephALEXin (KEFLEX) 500 MG capsule  2 times daily     03/20/18 0012    [place] VENOUS     03/19/18 2345           Follow Up: Roger Kill, PA-C [address] [place] [address] [phone]  Schedule an appointment as soon as possible for a visit  in 5-7 days, For close follow up to assess for likely skin infection and to assist with rehab.      This chart was dictated using voice recognition software.  Despite best efforts to proofread,  errors can occur which can change the documentation meaning.   Nira Conn, MD 03/20/18 [phone]

## 2018-03-19 NOTE — ED Triage Notes (Signed)
Pt states that for the past two days he has been having redness and swelling to the L lower leg, no fevers.

## 2018-03-20 ENCOUNTER — Ambulatory Visit (HOSPITAL_COMMUNITY): Admission: RE | Admit: 2018-03-20 | Payer: Self-pay | Source: Ambulatory Visit

## 2018-03-20 MED ORDER — CEPHALEXIN 500 MG PO CAPS: 500.0000 mg | ORAL_CAPSULE | Freq: Two times a day (BID) | ORAL | 0 refills | Status: AC

## 2018-03-22 ENCOUNTER — Ambulatory Visit (HOSPITAL_COMMUNITY)
Admission: RE | Admit: 2018-03-22 | Discharge: 2018-03-22 | Disposition: A | Discharge status: Home / Self Care | Payer: Self-pay | Source: Ambulatory Visit | Attending: Emergency Medicine | Admitting: Emergency Medicine

## 2018-03-22 DIAGNOSIS — L0390 Cellulitis, unspecified: Secondary | ICD-10-CM

## 2018-03-22 DIAGNOSIS — R609 Edema, unspecified: Secondary | ICD-10-CM

## 2018-03-22 DIAGNOSIS — M7989 Other specified soft tissue disorders: Secondary | ICD-10-CM | POA: Insufficient documentation

## 2018-03-22 NOTE — Progress Notes (Signed)
Patient from ED 03/19/18 for LLE venous duplex. States could not make it in on the 15th for scheduled test. Preliminary results can be found under CV proc through chart review. Jeb Levering, BS, RDMS, RVT

## 2018-08-24 ENCOUNTER — Encounter (HOSPITAL_COMMUNITY): Payer: Self-pay | Admitting: Emergency Medicine

## 2018-08-24 ENCOUNTER — Other Ambulatory Visit: Payer: Self-pay

## 2018-08-24 ENCOUNTER — Emergency Department (HOSPITAL_COMMUNITY): Payer: Self-pay

## 2018-08-24 ENCOUNTER — Emergency Department (HOSPITAL_COMMUNITY)
Admission: EM | Admit: 2018-08-24 | Discharge: 2018-08-24 | Disposition: A | Discharge status: Home / Self Care | Payer: Self-pay | Attending: Emergency Medicine | Admitting: Emergency Medicine

## 2018-08-24 DIAGNOSIS — G5631 Lesion of radial nerve, right upper limb: Secondary | ICD-10-CM | POA: Insufficient documentation

## 2018-08-24 DIAGNOSIS — I10 Essential (primary) hypertension: Secondary | ICD-10-CM | POA: Insufficient documentation

## 2018-08-24 MED ORDER — PREDNISONE 10 MG PO TABS: 40.0000 mg | ORAL_TABLET | Freq: Every day | ORAL | 0 refills | Status: DC

## 2018-08-24 NOTE — Discharge Instructions (Signed)
Call hand surgery for follow-up on Monday.  Take the prednisone as directed.  Wear the splint is much as possible.

## 2018-08-24 NOTE — ED Provider Notes (Signed)
[facility] EMERGENCY DEPARTMENT Provider Note   CSN: [id number] Arrival date & time: 08/24/18  1324     History   Chief Complaint Chief Complaint  Patient presents with  . Wrist Pain    HPI Logan Fisher is a 43 y.o. male.     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  States that he is slept in his car last night.  He woke this morning with a right wrist drop.  Denies any pain in the arm does have some numbness to the posterior aspect of the fingertips.  States he can make a fist and sensation on his palm feels fine.  Patient is right-hand dominant.  Patient denies any fevers patient denies otherwise feeling bad.  Patient without any respiratory symptoms.     Past Medical History:  Diagnosis Date  . AC (acromioclavicular) joint bone spurs   . Cellulitis   . Environmental allergies   . Hiatal hernia   . Hypercholesteremia   . Hypertension     There are no active problems to display for this patient.   Past Surgical History:  Procedure Laterality Date  . HERNIA REPAIR          Home Medications    Prior to Admission medications   Medication Sig Start Date End Date Taking? Authorizing Provider  predniSONE (DELTASONE) 10 MG tablet Take 4 tablets (40 mg total) by mouth daily. 08/24/18   Vanetta MuldersZackowski, Artemis Loyal, MD    Family History No family history on file.  Social History Social History   Tobacco Use  . Smoking status: Never Smoker  . Smokeless tobacco: Never Used  Substance Use Topics  . Alcohol use: Yes    Comment: weekly  . Drug use: Yes    Types: Methamphetamines, IV    Comment: heroin and meth use 4 hours ago     Allergies   Patient has no known allergies.   Review of Systems Review of Systems  Constitutional: Negative for chills and fever.  HENT: Negative for rhinorrhea and sore throat.   Eyes: Negative for visual disturbance.  Respiratory: Negative for cough and shortness of breath.   Cardiovascular: Negative for chest pain and leg swelling.   Gastrointestinal: Negative for abdominal pain, diarrhea, nausea and vomiting.  Genitourinary: Negative for dysuria.  Musculoskeletal: Negative for back pain and neck pain.  Skin: Negative for rash.  Neurological: Positive for weakness and numbness. Negative for dizziness, light-headedness and headaches.  Hematological: Does not bruise/bleed easily.  Psychiatric/Behavioral: Negative for confusion.     Physical Exam Updated Vital Signs BP 134/76 (BP Location: Left Arm)   Pulse 99   Temp 98.2 F (36.8 C) (Oral)   Resp 14   Ht 1.956 m (6\' 5" )   Wt 86.2 kg   SpO2 98%   BMI 22.53 kg/m   Physical Exam Vitals signs and nursing note reviewed.  Constitutional:      Appearance: He is well-developed.  HENT:     Head: Normocephalic and atraumatic.  Eyes:     Extraocular Movements: Extraocular movements intact.     Conjunctiva/sclera: Conjunctivae normal.     Pupils: Pupils are equal, round, and reactive to light.  Neck:     Musculoskeletal: Neck supple. No neck rigidity.  Cardiovascular:     Rate and Rhythm: Normal rate and regular rhythm.     Heart sounds: No murmur.  Pulmonary:     Effort: Pulmonary effort is normal. No respiratory distress.     Breath sounds: Normal breath  sounds.  Abdominal:     Palpations: Abdomen is soft.     Tenderness: There is no abdominal tenderness.  Musculoskeletal:        General: No swelling, tenderness or deformity.     Comments: Patient's radial pulses are 2+ bilaterally.  Patient has right wrist drop.  Consistent with radial nerve palsy.  Has good grip strength.  No extension at the wrist.  Weakness extension of the fingers but they do go back to neutral position on their own.  Needle track marks in the right upper arm.  No evidence of infection.  No evidence of compartment syndrome.  No swelling or tightness to the muscle groups in the arm or forearm.  Good cap refill of the fingers.  Skin:    General: Skin is warm and dry.     Capillary Refill:  Capillary refill takes less than 2 seconds.  Neurological:     Mental Status: He is alert and oriented to person, place, and time.     Motor: Weakness present.      ED Treatments / Results  Labs (all labs ordered are listed, but only abnormal results are displayed) Labs Reviewed - No data to display  EKG    Radiology Dg Wrist Complete Right  Result Date: 08/24/2018 CLINICAL DATA:  Right wrist muscle weakness. EXAM: RIGHT WRIST - COMPLETE 3+ VIEW COMPARISON:  None. FINDINGS: There is no evidence of fracture or dislocation. There is no evidence of arthropathy or other focal bone abnormality. Soft tissues are unremarkable. IMPRESSION: Negative. Electronically Signed   By: Fidela Salisbury M.D.   On: 08/24/2018 15:06    Procedures Procedures (including critical care time)  Medications Ordered in ED Medications - No data to display   Initial Impression / Assessment and Plan / ED Course  I have reviewed the triage vital signs and the nursing notes.  Pertinent labs & imaging results that were available during my care of the patient were reviewed by me and considered in my medical decision making (see chart for details).      And is consistent with a right radial nerve palsy.  Probably secondary to compression.  We will have follow-up with hand.  We will put him in a Velcro splint.  And a short course of steroids.  No complicating factors at this time.  No evidence of compartment syndrome.  No evidence of secondary infection.    Final Clinical Impressions(s) / ED Diagnoses   Final diagnoses:  Acute radial nerve palsy of right upper extremity    ED Discharge Orders         Ordered    predniSONE (DELTASONE) 10 MG tablet  Daily     08/24/18 1557           Fredia Sorrow, MD 08/24/18 1727

## 2018-08-24 NOTE — ED Notes (Signed)
PCXR done

## 2018-08-24 NOTE — ED Triage Notes (Addendum)
Pt c/o LT pain and weakness that began this morning when he woke up. Denies injury. States that muscles around wrist feel flacid. States sometimes he will have shooting pains down hand. Pt admits to using meth and heroin about 4 hours ago.

## 2018-09-08 ENCOUNTER — Encounter (HOSPITAL_COMMUNITY): Payer: Self-pay | Admitting: Emergency Medicine

## 2018-09-08 ENCOUNTER — Emergency Department (HOSPITAL_COMMUNITY)
Admission: EM | Admit: 2018-09-08 | Discharge: 2018-09-08 | Disposition: A | Discharge status: Home / Self Care | Payer: Self-pay | Attending: Emergency Medicine | Admitting: Emergency Medicine

## 2018-09-08 ENCOUNTER — Other Ambulatory Visit: Payer: Self-pay

## 2018-09-08 DIAGNOSIS — I10 Essential (primary) hypertension: Secondary | ICD-10-CM | POA: Insufficient documentation

## 2018-09-08 DIAGNOSIS — F1910 Other psychoactive substance abuse, uncomplicated: Secondary | ICD-10-CM | POA: Insufficient documentation

## 2018-09-08 DIAGNOSIS — T8029XA Infection following other infusion, transfusion and therapeutic injection, initial encounter: Secondary | ICD-10-CM | POA: Insufficient documentation

## 2018-09-08 DIAGNOSIS — L0291 Cutaneous abscess, unspecified: Secondary | ICD-10-CM

## 2018-09-08 DIAGNOSIS — Y69 Unspecified misadventure during surgical and medical care: Secondary | ICD-10-CM | POA: Insufficient documentation

## 2018-09-08 DIAGNOSIS — L02413 Cutaneous abscess of right upper limb: Secondary | ICD-10-CM | POA: Insufficient documentation

## 2018-09-08 DIAGNOSIS — F1510 Other stimulant abuse, uncomplicated: Secondary | ICD-10-CM | POA: Insufficient documentation

## 2018-09-08 MED ORDER — LIDOCAINE-EPINEPHRINE (PF) 2 %-1:200000 IJ SOLN
10.0000 mL | Freq: Once | INTRAMUSCULAR | Status: AC
  Administered 2018-09-08: 10 mL via INTRADERMAL
  Filled 2018-09-08: qty 20

## 2018-09-08 MED ORDER — CLINDAMYCIN HCL 150 MG PO CAPS: 150.0000 mg | ORAL_CAPSULE | Freq: Three times a day (TID) | ORAL | 0 refills | Status: AC

## 2018-09-08 NOTE — ED Notes (Signed)
Have pt call friend, jeff, when possible

## 2018-09-08 NOTE — ED Triage Notes (Signed)
Pt presents with abscess to RUE from shooting up x 4 days.  Pt states he has taken small amount of various antibiotics he had at home, pt also reports he punctured abscess and injected peroxide into abscess. Pt denies fever, area to R bicep swollen and red.  Pt also would like to be seen for ?radial compression injury x 2 weeks, pt seen at [facility] for same but is not happy with plan of care.

## 2018-09-08 NOTE — ED Provider Notes (Signed)
I have personally seen and examined the patient. I have reviewed the documentation on PMH/FH/Soc Hx. I have discussed the plan of care with the resident and patient.  I have reviewed and agree with the resident's documentation. Please see associated encounter note. Briefly, the patient is a 43 y.o. male here with abscess to right upper arm.  Patient is IV drug user.  No signs to suggest sepsis.  Ultrasound was performed by my resident that confirmed abscess.  I&D was performed.  Patient to get IV antibiotics.  Patient had good pulses.  Had radial nerve palsy that appears to be improved.  This is chronic and he hasfollowed up with hand already.  Educated about drug abuse and given resources.  Discharged in good condition.  Given return precautions.  This chart was dictated using voice recognition software.  Despite best efforts to proofread,  errors can occur which can change the documentation meaning.     EKG Interpretation None        Lennice Sites, DO 09/08/18 2317

## 2018-09-08 NOTE — ED Notes (Signed)
Abscess to right arm. Arm red and swollen

## 2018-09-08 NOTE — ED Provider Notes (Signed)
[facility] EMERGENCY DEPARTMENT Provider Note   CSN: [id number] Arrival date & time: 09/08/18  1902    History   Chief Complaint Chief Complaint  Patient presents with  . Abscess    HPI Logan Fisher is a 43 y.o. male.     The history is provided by the patient and medical records.  Abscess Location:  Shoulder/arm Shoulder/arm abscess location:  R arm Size:  5x5cm Abscess quality: fluctuance, induration and painful   Red streaking: yes   Duration:  5 days Progression:  Worsening Pain details:    Quality:  Throbbing and sharp   Severity:  Moderate   Timing:  Constant   Progression:  Worsening Chronicity:  New Context: injected drug use   Relieved by:  Nothing Ineffective treatments: Pt and his gf have been trying to drain it and washing with peroxide. Associated symptoms: no fever and no vomiting     Past Medical History:  Diagnosis Date  . AC (acromioclavicular) joint bone spurs   . Cellulitis   . Environmental allergies   . Hiatal hernia   . Hypercholesteremia   . Hypertension     There are no active problems to display for this patient.   Past Surgical History:  Procedure Laterality Date  . HERNIA REPAIR          Home Medications    Prior to Admission medications   Medication Sig Start Date End Date Taking? Authorizing Provider  clindamycin (CLEOCIN) 150 MG capsule Take 1 capsule (150 mg total) by mouth 3 (three) times daily for 10 days. 09/08/18 09/18/18  Jefm Petty, MD  predniSONE (DELTASONE) 10 MG tablet Take 4 tablets (40 mg total) by mouth daily. 08/24/18   Fredia Sorrow, MD    Family History No family history on file.  Social History Social History   Tobacco Use  . Smoking status: Never Smoker  . Smokeless tobacco: Never Used  Substance Use Topics  . Alcohol use: Yes    Comment: weekly  . Drug use: Yes    Types: Methamphetamines, IV    Comment: heroin and meth      Allergies   Patient has no known  allergies.   Review of Systems Review of Systems  Constitutional: Negative for chills and fever.  HENT: Negative for ear pain and sore throat.   Eyes: Negative for pain and visual disturbance.  Respiratory: Negative for cough and shortness of breath.   Cardiovascular: Negative for chest pain and palpitations.  Gastrointestinal: Negative for abdominal pain and vomiting.  Genitourinary: Negative for dysuria and hematuria.  Musculoskeletal: Negative for back pain and neck pain.  Skin: Positive for wound. Negative for color change and rash.  Neurological: Negative for seizures and syncope.  All other systems reviewed and are negative.    Physical Exam Updated Vital Signs BP (!) 134/91 (BP Location: Left Arm)   Pulse 89   Temp 98.6 F (37 C) (Oral)   Resp 18   Ht 6\' 5"  (1.956 m)   Wt 86 kg   SpO2 98%   BMI 22.48 kg/m   Physical Exam Vitals signs and nursing note reviewed.  Constitutional:      Appearance: He is well-developed. He is not ill-appearing.  HENT:     Head: Normocephalic and atraumatic.     Right Ear: External ear normal.     Left Ear: External ear normal.     Nose: Nose normal.     Mouth/Throat:  Mouth: Mucous membranes are moist.  Eyes:     Conjunctiva/sclera: Conjunctivae normal.     Pupils: Pupils are equal, round, and reactive to light.  Neck:     Musculoskeletal: Neck supple.  Cardiovascular:     Rate and Rhythm: Normal rate and regular rhythm.     Pulses: Normal pulses.     Heart sounds: No murmur.  Pulmonary:     Effort: Pulmonary effort is normal. No respiratory distress.     Breath sounds: Normal breath sounds.  Abdominal:     General: Abdomen is flat.     Palpations: Abdomen is soft.     Tenderness: There is no abdominal tenderness.  Skin:    General: Skin is warm and dry.     Findings: Abscess present.          Comments: 4x4 cm indurated, fluctuant abscess with surrounding erythema  Neurological:     Mental Status: He is alert.       ED Treatments / Results  Labs (all labs ordered are listed, but only abnormal results are displayed) Labs Reviewed - No data to display  EKG None  Radiology No results found.  Procedures .Marland Kitchen.Incision and Drainage  Date/Time: 09/08/2018 10:30 PM Performed by: Saverio DankerWilliams, Eddy Liszewski A, MD Authorized by: Virgina Norfolkuratolo, Adam, DO   Consent:    Consent obtained:  Verbal   Consent given by:  Patient   Risks discussed:  Bleeding, incomplete drainage, pain, damage to other organs and infection   Alternatives discussed:  No treatment, delayed treatment, alternative treatment and observation Location:    Type:  Abscess   Size:  4x4 cm   Location:  Upper extremity Pre-procedure details:    Skin preparation:  Betadine and Chloraprep Anesthesia (see MAR for exact dosages):    Anesthesia method:  Local infiltration   Local anesthetic:  Lidocaine 2% WITH epi Procedure type:    Complexity:  Complex Procedure details:    Needle aspiration: no     Incision types:  Single straight   Incision depth:  Submucosal   Scalpel blade:  11   Wound management:  Probed and deloculated, irrigated with saline and extensive cleaning   Drainage:  Purulent   Drainage amount:  Copious   Wound treatment:  Wound left open   Packing materials:  1/4 in iodoform gauze   Amount 1/4" iodoform:  18 inches Post-procedure details:    Patient tolerance of procedure:  Tolerated well, no immediate complications   (including critical care time)  Medications Ordered in ED Medications  lidocaine-EPINEPHrine (XYLOCAINE W/EPI) 2 %-1:200000 (PF) injection 10 mL (10 mLs Intradermal Given by Other 09/08/18 2226)   EMERGENCY DEPARTMENT US SOFT TISSUE INTERPRETATION "Study: Limited Soft Tissue Ultrasound"  INDICATIONS: Soft tissue infection Multiple views of the body part were obtained in real-time with a multi-frequency linear probe  PERFORMED BY: Myself IMAGES ARCHIVED?: Yes SIDE:Right  BODY PART:Upper extremity  INTERPRETATION:  Abcess present and Cellulitis present    Initial Impression / Assessment and Plan / ED Course  I have reviewed the triage vital signs and the nursing notes.  Pertinent labs & imaging results that were available during my care of the patient were reviewed by me and considered in my medical decision making (see chart for details).  Penni HomansJames O Bogden 43 y.o. male  with significant PMHx of polysubstance abuse including IVDU who presented to ED with concern for an abscess to his RUE.  Incision and drainage performed. Procedure note above.   Patient discharged with  antibiotic therapy. Strict return precautions given. Patient to follow-up with PCP in 2 days. Patient given appropriate wound care instructions.   The plan for this patient was discussed with Dr. Virgina NorfolkAdam Curatolo, who voiced agreement and who oversaw evaluation and treatment of this patient.    Final Clinical Impressions(s) / ED Diagnoses   Final diagnoses:  Abscess  Injection site abscess, initial encounter  Polysubstance abuse [facility](HCC)    ED Discharge Orders         Ordered    clindamycin (CLEOCIN) 150 MG capsule  3 times daily     09/08/18 2307           Saverio DankerWilliams, Roben Schliep A, MD 09/09/18 [id number]    Virgina Norfolkuratolo, Adam, DO 09/09/18 1239

## 2018-09-08 NOTE — ED Notes (Signed)
Blood was drawn but not needed per NT Bryson Ha

## 2018-09-08 NOTE — ED Notes (Signed)
ED Provider at bedside. 

## 2018-09-08 NOTE — Discharge Instructions (Signed)
Substance Abuse Treatment Programs ° °Intensive Outpatient Programs °[facility]     °[address]      °[place], [place]                   °[phone]      ° °[facility] °[address] °[place], [place] °[phone] ° °[facility]     °(Inpatient and outpatient)     °[address]           °[phone]   ° °[facility] °[phone] (Suboxone and Methadone) ° °[address]      °[place], [place] [address]      °[phone]      ° °[address] °[place], [place] °[phone] ° °[facility] (Outpatient/Inpatient, Chemical)    °(insurance only) [phone]      °       °[facility] (Groups & Residential) °[place], [place] °[phone] ° °   °[facility]     °[address]     °[place], [place]      °[phone]      ° °[facility] (for caregivers and family) °[address] °[place], [place] °336-299-4655 ° ° ° ° ° °Residential Treatment Programs °[facility]      °[address], [place], [place] [address]  °[phone]      ° °[facility] °[address] [place], [place] [address] °[phone] ° °[facility]        °[phone]      ° °[facility] °[phone] ° °[facility]             °[address]                                         °[place], [place]                                                °[phone] or 336-784-9470                              ° °[facility] [place] °[address] °[place], [address] °[phone] ° °[facility]    °[address]      °[place], [place]     °[phone]      ° °[facility] °[address] °[place], [place] °336-285-9073 ° °[facility]   °[address]     °[place], [place] [address]     °[phone]      °Admissions: 8am-3pm M-F ° °[facility] °[address] °[place] °336-227-7417 ° °[facility]: Residential Program (90 Days)   °[place], [place]      °336-725-8389 or 800-758-6077    ° °[facility] °[place], [place] °(Walk in Hours over the weekend or by referral) ° °[facility] °[address], [place], [place] [address] °([phone] ° °Crisis Mobile: [facility]:  1-877-626-1772 (for crisis response 24 hours a day) °[facility] Hotline:      1-800-256-2452 °Outpatient Psychiatry and Counseling ° °[facility]: Mobile Crisis   Management 24 hours:  1-877-626-1772 ° °[facility] sliding scale fee and walk in schedule: M-F 8am-12pm/1pm-3pm °[address]  °[place], [place] [address] °[phone] ° °[facility] °[address] °[place], [place] [address] °336-703-9650 ° °[facility] ([facility])- new patient walk-in appointments available Monday - Friday 8am -3pm.          °[address] °[place], [place] [address] °336-676-6840 or crisis line- 336-676-6905 ° °[facility] Intensive Outpatient Therapy Program °[address] °[place], [place] [address] °336-832-9804 ° °[facility]                  °Crisis Services      °[phone]      °[address]     °[place], [place] [address]                ° °[facility]   °[facility] °[phone] °[address] °[place], [place] [address] ° ° °[facility]          °[address]  °[place], [place] [address]       °([phone] ° °[facility] °[address] °[place], [place] [address] °336-292-1510 ° °[facility]    °[address]    °[place], [place] [address]     °[phone]      ° °Parish McKinney, MD     °[address]     °[place] [address]     °336-282-1251     °  °[facility] °[address] °[place] [address] ° °[facility]     °[address]     °[place], [place]      °336-542-2076      ° °[facility] °Shamsher Ahluwalia, MD °[address] °[place], [place] [address] °336-420-9558 ° °[facility]     °[address]     °[place], [place] [address]     °336-274-1237      ° °[facility] °[address] °[place], [place] [address] °336-854-4450 °Resources for Temporary Residential Assistance/Crisis Centers ° °DAY CENTERS °[facility] °M-F 8am-3pm   °[address] [place], [place] [address]   336-332-0824 °Services include: laundry, barbering, support groups, case management, phone  & computer access, showers, AA/NA mtgs, mental health/substance abuse nurse, job skills class, disability information, VA assistance, spiritual classes, etc.  ° °HOMELESS SHELTERS ° °[facility]     °[facility]   °[address], [place]     °336.271.5959       °       °[facility] (women and children)       °[address] °[place], [place] [address] °336-275-0820 °[email or web address] for application and process °Application Required ° °[facility]   °[address]    °[place] [address]     °[phone]       °             °[facility] °[address] °, [place] [address] °336.273.5572 °336-235-0363(schedule application appt.) °Application Required ° °[facility] (women only)    °[address]     °[place], [place] [address]     °336-884-1039      °  Intake starts 6pm daily °Need valid ID, SSC, & Police report °[facility] [place] °[address] °[place], [place] °336-881-5420 °Application Required ° °[facility] (men only)     °[address]      °[place], [place]     °[phone]      ° °[facility] °(Pregnant women only) °[address] °[place], [place] °336-275-0206 ° °[facility]      °[address]      °[place], [place] [address]     °[phone]      °       °[facility] °[address] °[place], [place] °336-723-1848 °90 day commitment/SA/Application process ° °[facility](men only)     °[address]     °[place], [place]     °[phone]       °Check-in at 7pm     °       °[facility] °[address] °[place], [place] [address] °336-248-6684 °Men/Women/Women and Children must be there by 7 pm ° °[facility] °[place], [place] °336-722-8721                ° °

## 2018-11-07 ENCOUNTER — Emergency Department (HOSPITAL_COMMUNITY)
Admission: EM | Admit: 2018-11-07 | Discharge: 2018-11-07 | Disposition: A | Discharge status: Home / Self Care | Payer: Self-pay | Attending: Emergency Medicine | Admitting: Emergency Medicine

## 2018-11-07 ENCOUNTER — Encounter (HOSPITAL_COMMUNITY): Payer: Self-pay | Admitting: Emergency Medicine

## 2018-11-07 ENCOUNTER — Other Ambulatory Visit: Payer: Self-pay

## 2018-11-07 DIAGNOSIS — T401X1A Poisoning by heroin, accidental (unintentional), initial encounter: Secondary | ICD-10-CM | POA: Insufficient documentation

## 2018-11-07 DIAGNOSIS — I10 Essential (primary) hypertension: Secondary | ICD-10-CM | POA: Insufficient documentation

## 2018-11-07 DIAGNOSIS — R110 Nausea: Secondary | ICD-10-CM | POA: Insufficient documentation

## 2018-11-07 DIAGNOSIS — R002 Palpitations: Secondary | ICD-10-CM | POA: Insufficient documentation

## 2018-11-07 LAB — CBC WITH DIFFERENTIAL/PLATELET
Abs Immature Granulocytes: 0.02 10*3/uL (ref 0.00–0.07)
Basophils Absolute: 0.1 10*3/uL (ref 0.0–0.1)
Basophils Relative: 1 %
Eosinophils Absolute: 0.1 10*3/uL (ref 0.0–0.5)
Eosinophils Relative: 1 %
HCT: 43.5 % (ref 39.0–52.0)
Hemoglobin: 14.5 g/dL (ref 13.0–17.0)
Immature Granulocytes: 0 %
Lymphocytes Relative: 28 %
Lymphs Abs: 1.9 10*3/uL (ref 0.7–4.0)
MCH: 27.5 pg (ref 26.0–34.0)
MCHC: 33.3 g/dL (ref 30.0–36.0)
MCV: 82.4 fL (ref 80.0–100.0)
Monocytes Absolute: 0.9 10*3/uL (ref 0.1–1.0)
Monocytes Relative: 13 %
Neutro Abs: 4 10*3/uL (ref 1.7–7.7)
Neutrophils Relative %: 57 %
Platelets: 314 10*3/uL (ref 150–400)
RBC: 5.28 MIL/uL (ref 4.22–5.81)
RDW: 13.3 % (ref 11.5–15.5)
WBC: 7 10*3/uL (ref 4.0–10.5)
nRBC: 0 % (ref 0.0–0.2)

## 2018-11-07 LAB — COMPREHENSIVE METABOLIC PANEL
ALT: 14 U/L (ref 0–44)
AST: 15 U/L (ref 15–41)
Albumin: 3.8 g/dL (ref 3.5–5.0)
Alkaline Phosphatase: 71 U/L (ref 38–126)
Anion gap: 12 (ref 5–15)
BUN: 12 mg/dL (ref 6–20)
CO2: 24 mmol/L (ref 22–32)
Calcium: 9 mg/dL (ref 8.9–10.3)
Chloride: 103 mmol/L (ref 98–111)
Creatinine, Ser: 0.89 mg/dL (ref 0.61–1.24)
GFR calc Af Amer: >60 mL/min (ref >60)
GFR calc non Af Amer: >60 mL/min (ref >60)
Glucose, Bld: 114 mg/dL — ABNORMAL HIGH (ref 70–99)
Potassium: 3.5 mmol/L (ref 3.5–5.1)
Sodium: 139 mmol/L (ref 135–145)
Total Bilirubin: 0.6 mg/dL (ref 0.3–1.2)
Total Protein: 7.3 g/dL (ref 6.5–8.1)

## 2018-11-07 LAB — SALICYLATE LEVEL: Salicylate Lvl: <7 mg/dL (ref 2.8–30.0)

## 2018-11-07 LAB — ETHANOL: Alcohol, Ethyl (B): <10 mg/dL (ref <10)

## 2018-11-07 LAB — ACETAMINOPHEN LEVEL: Acetaminophen (Tylenol), Serum: <10 ug/mL — ABNORMAL LOW (ref 10–30)

## 2018-11-07 MED ORDER — DICYCLOMINE HCL 20 MG PO TABS: 20.0000 mg | ORAL_TABLET | Freq: Two times a day (BID) | ORAL | 0 refills | Status: DC | PRN

## 2018-11-07 MED ORDER — SODIUM CHLORIDE 0.9 % IV BOLUS
1000.0000 mL | Freq: Once | INTRAVENOUS | Status: AC
  Administered 2018-11-07: 04:00:00 1000 mL via INTRAVENOUS

## 2018-11-07 MED ORDER — ONDANSETRON HCL 4 MG/2ML IJ SOLN
4.0000 mg | Freq: Once | INTRAMUSCULAR | Status: AC
  Administered 2018-11-07: 4 mg via INTRAVENOUS
  Filled 2018-11-07: qty 2

## 2018-11-07 MED ORDER — ONDANSETRON 4 MG PO TBDP: 4.0000 mg | ORAL_TABLET | Freq: Three times a day (TID) | ORAL | 0 refills | Status: DC | PRN

## 2018-11-07 NOTE — ED Notes (Signed)
The pt reports that he has been using iv heroin for a long time

## 2018-11-07 NOTE — ED Notes (Addendum)
The pt is alert and oriented skin warm and dry  No distress  The pt shot up heroine  Earlier  He has track marks on both arms and hands

## 2018-11-07 NOTE — ED Provider Notes (Signed)
[facility] Provider Note   CSN: [id number] Arrival date & time: 11/07/18  0335     History   Chief Complaint Chief Complaint  Patient presents with  . Drug Overdose    HPI Penni HomansJames O Devine is a 43 y.o. male.     The history is provided by the patient and medical records. No language interpreter was used.  Drug Overdose Pertinent negatives include no chest pain and no abdominal pain.   Penni HomansJames O Geiman is a 43 y.o. male  with a PMH of IV drug use who presents to the Emergency Department for evaluation after accidental heroin overdose just prior to arrival.  Patient states that he did more heroin than he intended on accident and overdose.  He was with friends who gave him Narcan.  He states that they gave him 6 sprays of Narcan intranasally.  Upon EMS arrival, he was A&O.  Patient states that he currently feels nauseous and as if his heart is racing very fast.  Denies any chest pain or shortness of breath.  No abdominal pain.  No vomiting.  He is requesting information about rehab and would like medical clearance to go to rehab that is something that we can do in the ER tonight.  Denies SI.  Denies alcohol use today.  States that he did meth earlier in the day.  No other drug use.  Past Medical History:  Diagnosis Date  . AC (acromioclavicular) joint bone spurs   . Cellulitis   . Environmental allergies   . Hiatal hernia   . Hypercholesteremia   . Hypertension     There are no active problems to display for this patient.   Past Surgical History:  Procedure Laterality Date  . HERNIA REPAIR          Home Medications    Prior to Admission medications   Medication Sig Start Date End Date Taking? Authorizing Provider  dicyclomine (BENTYL) 20 MG tablet Take 1 tablet (20 mg total) by mouth 2 (two) times daily as needed (Abdominal cramping). 11/07/18   Ward, Chase PicketJaime Pilcher, PA-C  ondansetron (ZOFRAN ODT) 4 MG disintegrating tablet Take 1  tablet (4 mg total) by mouth every 8 (eight) hours as needed for nausea or vomiting. 11/07/18   Ward, Chase PicketJaime Pilcher, PA-C  predniSONE (DELTASONE) 10 MG tablet Take 4 tablets (40 mg total) by mouth daily. Patient not taking: Reported on 11/07/2018 08/24/18   Vanetta MuldersZackowski, Scott, MD    Family History History reviewed. No pertinent family history.  Social History Social History   Tobacco Use  . Smoking status: Never Smoker  . Smokeless tobacco: Never Used  Substance Use Topics  . Alcohol use: Yes    Comment: weekly  . Drug use: Yes    Types: Methamphetamines, IV    Comment: heroin and meth      Allergies   Patient has no known allergies.   Review of Systems Review of Systems  Cardiovascular: Positive for palpitations. Negative for chest pain and leg swelling.  Gastrointestinal: Positive for nausea. Negative for abdominal pain, diarrhea and vomiting.  All other systems reviewed and are negative.    Physical Exam Updated Vital Signs BP (!) 137/105   Pulse (!) 101   Temp 98.3 F (36.8 C) (Oral)   Resp 17   SpO2 94%   Physical Exam Vitals signs and nursing note reviewed.  Constitutional:      General: He is not in acute distress.  Appearance: He is well-developed.  HENT:     Head: Normocephalic and atraumatic.  Neck:     Musculoskeletal: Neck supple.  Cardiovascular:     Rate and Rhythm: Regular rhythm.     Heart sounds: Normal heart sounds. No murmur.     Comments: Tachycardic, but regular. Pulmonary:     Effort: Pulmonary effort is normal. No respiratory distress.     Breath sounds: Normal breath sounds.  Abdominal:     General: There is no distension.     Palpations: Abdomen is soft.     Comments: No abdominal tenderness.  Skin:    General: Skin is warm and dry.  Neurological:     Mental Status: He is alert.     Comments: A&Ox4.  Psychiatric:        Mood and Affect: Mood normal.      ED Treatments / Results  Labs (all labs ordered are listed, but  only abnormal results are displayed) Labs Reviewed  COMPREHENSIVE METABOLIC PANEL - Abnormal; Notable for the following components:      Result Value   Glucose, Bld 114 (*)    All other components within normal limits  ACETAMINOPHEN LEVEL - Abnormal; Notable for the following components:   Acetaminophen (Tylenol), Serum <10 (*)    All other components within normal limits  CBC WITH DIFFERENTIAL/PLATELET  ETHANOL  SALICYLATE LEVEL  RAPID URINE DRUG SCREEN, HOSP PERFORMED    EKG None  Radiology No results found.  Procedures Procedures (including critical care time)  Medications Ordered in ED Medications  sodium chloride 0.9 % bolus 1,000 mL (1,000 mLs Intravenous New Bag/Given 11/07/18 0420)  ondansetron (ZOFRAN) injection 4 mg (4 mg Intravenous Given 11/07/18 0420)     Initial Impression / Assessment and Plan / ED Course  I have reviewed the triage vital signs and the nursing notes.  Pertinent labs & imaging results that were available during my care of the patient were reviewed by me and considered in my medical decision making (see chart for details).       HONORIO DEVOL is a 43 y.o. male who presents to ED for evaluation after accidental heroin overdose just prior to arrival.  He was given Narcan by his friends who witnessed the event.  Upon arrival to the ER, he was A&O.  Complaining of nausea as well as feeling as if his heart is racing.  He is tachycardic in the 110's. Will give fluid bolus and continue to monitor patient.   Labs reviewed and reassuring.  Patient re-evaluated. HR in the 90's.  Evaluation does not show pathology that would require ongoing emergent intervention or inpatient treatment.  Discharged home with resources for substance abuse as well as zofran and bentyl for symptomatic management. Reasons to return to the ER were discussed and all questions answered.   Final Clinical Impressions(s) / ED Diagnoses   Final diagnoses:  Accidental overdose of  heroin, initial encounter [facility])    ED Discharge Orders         Ordered    ondansetron (ZOFRAN ODT) 4 MG disintegrating tablet  Every 8 hours PRN     11/07/18 0540    dicyclomine (BENTYL) 20 MG tablet  2 times daily PRN     11/07/18 0540           Ward, Ozella Almond, PA-C 11/07/18 0550    Mesner, Corene Cornea, MD 11/07/18 0710

## 2018-11-07 NOTE — Discharge Instructions (Signed)
It was my pleasure taking care of you today!   Zofran as needed for nausea.  Bentyl as needed for abdominal cramping.  Imodium can be used if needed for diarrhea.  Stay hydrated. Drink plenty of fluids.   Follow up with your primary care doctor.   Return to ER for new or worsening symptoms, any additional concerns.

## 2018-11-07 NOTE — ED Triage Notes (Signed)
Patient shot up a larger amount of heroin than normal.  Patient having some heart racing.  No shortness of breath, no LOC.  HR of 110 ST on monitor.  CAOx4.

## 2018-11-25 ENCOUNTER — Encounter (HOSPITAL_COMMUNITY): Payer: Self-pay | Admitting: Emergency Medicine

## 2018-11-25 ENCOUNTER — Emergency Department (HOSPITAL_COMMUNITY)
Admission: EM | Admit: 2018-11-25 | Discharge: 2018-11-25 | Disposition: A | Discharge status: Home / Self Care | Payer: Self-pay | Attending: Emergency Medicine | Admitting: Emergency Medicine

## 2018-11-25 ENCOUNTER — Other Ambulatory Visit: Payer: Self-pay

## 2018-11-25 DIAGNOSIS — L03012 Cellulitis of left finger: Secondary | ICD-10-CM | POA: Insufficient documentation

## 2018-11-25 DIAGNOSIS — I10 Essential (primary) hypertension: Secondary | ICD-10-CM | POA: Insufficient documentation

## 2018-11-25 LAB — BASIC METABOLIC PANEL
Anion gap: 7 (ref 5–15)
BUN: 7 mg/dL (ref 6–20)
CO2: 29 mmol/L (ref 22–32)
Calcium: 8.9 mg/dL (ref 8.9–10.3)
Chloride: 102 mmol/L (ref 98–111)
Creatinine, Ser: 0.67 mg/dL (ref 0.61–1.24)
GFR calc Af Amer: >60 mL/min (ref >60)
GFR calc non Af Amer: >60 mL/min (ref >60)
Glucose, Bld: 88 mg/dL (ref 70–99)
Potassium: 3.8 mmol/L (ref 3.5–5.1)
Sodium: 138 mmol/L (ref 135–145)

## 2018-11-25 LAB — CBC WITH DIFFERENTIAL/PLATELET
Abs Immature Granulocytes: 0.03 10*3/uL (ref 0.00–0.07)
Basophils Absolute: 0.1 10*3/uL (ref 0.0–0.1)
Basophils Relative: 1 %
Eosinophils Absolute: 0.1 10*3/uL (ref 0.0–0.5)
Eosinophils Relative: 2 %
HCT: 40.6 % (ref 39.0–52.0)
Hemoglobin: 13.9 g/dL (ref 13.0–17.0)
Immature Granulocytes: 0 %
Lymphocytes Relative: 26 %
Lymphs Abs: 2 10*3/uL (ref 0.7–4.0)
MCH: 28.3 pg (ref 26.0–34.0)
MCHC: 34.2 g/dL (ref 30.0–36.0)
MCV: 82.5 fL (ref 80.0–100.0)
Monocytes Absolute: 0.9 10*3/uL (ref 0.1–1.0)
Monocytes Relative: 12 %
Neutro Abs: 4.8 10*3/uL (ref 1.7–7.7)
Neutrophils Relative %: 59 %
Platelets: 346 10*3/uL (ref 150–400)
RBC: 4.92 MIL/uL (ref 4.22–5.81)
RDW: 12.9 % (ref 11.5–15.5)
WBC: 8 10*3/uL (ref 4.0–10.5)
nRBC: 0 % (ref 0.0–0.2)

## 2018-11-25 MED ORDER — DOXYCYCLINE HYCLATE 100 MG PO CAPS: 100.0000 mg | ORAL_CAPSULE | Freq: Two times a day (BID) | ORAL | 0 refills | Status: DC

## 2018-11-25 MED ORDER — DOXYCYCLINE HYCLATE 100 MG PO TABS
100.0000 mg | ORAL_TABLET | Freq: Once | ORAL | Status: AC
  Administered 2018-11-25: 100 mg via ORAL
  Filled 2018-11-25: qty 1

## 2018-11-25 NOTE — Discharge Instructions (Addendum)
Please read attached information. If you experience any new or worsening signs or symptoms please return to the emergency room for evaluation. Please follow-up with your primary care provider or specialist as discussed. Please use medication prescribed only as directed and discontinue taking if you have any concerning signs or symptoms.   °

## 2018-11-25 NOTE — ED Triage Notes (Signed)
Pt. Stated, I started having left hand pain and not a good grip and it goes up into my left shoulder. And over all feeling really run down .

## 2018-11-25 NOTE — ED Provider Notes (Signed)
[facility] EMERGENCY DEPARTMENT Provider Note   CSN: [id number] Arrival date & time: 11/25/18  1551     History   Chief Complaint Chief Complaint  Patient presents with  . Hand Pain  . Shoulder Pain    HPI Logan Fisher is a 43 y.o. male.     HPI   43 year old male presents today with complaints of hand pain.  Patient notes 3-day history of pain in his left pointer finger.  He notes associated swelling, denies any change in range of motion or pain with range of motion at the finger hand.  He also notes some tenderness along the dorsal aspect of the left hand.  He notes pain in his left shoulder but denies any decreased range of motion or tenderness to palpation.  He also notes he feels like he is getting sick with generalized body aches, denies any fever.  Past Medical History:  Diagnosis Date  . AC (acromioclavicular) joint bone spurs   . Cellulitis   . Environmental allergies   . Hiatal hernia   . Hypercholesteremia   . Hypertension     There are no active problems to display for this patient.   Past Surgical History:  Procedure Laterality Date  . HERNIA REPAIR          Home Medications    Prior to Admission medications   Medication Sig Start Date End Date Taking? Authorizing Provider  dicyclomine (BENTYL) 20 MG tablet Take 1 tablet (20 mg total) by mouth 2 (two) times daily as needed (Abdominal cramping). 11/07/18   Ward, Ozella Almond, PA-C  doxycycline (VIBRAMYCIN) 100 MG capsule Take 1 capsule (100 mg total) by mouth 2 (two) times daily. 11/25/18   Djon Tith, Dellis Filbert, PA-C  ondansetron (ZOFRAN ODT) 4 MG disintegrating tablet Take 1 tablet (4 mg total) by mouth every 8 (eight) hours as needed for nausea or vomiting. 11/07/18   Ward, Ozella Almond, PA-C  predniSONE (DELTASONE) 10 MG tablet Take 4 tablets (40 mg total) by mouth daily. Patient not taking: Reported on 11/07/2018 08/24/18   Fredia Sorrow, MD    Family History No family history on  file.  Social History Social History   Tobacco Use  . Smoking status: Never Smoker  . Smokeless tobacco: Never Used  Substance Use Topics  . Alcohol use: Yes    Comment: weekly  . Drug use: Yes    Types: Methamphetamines, IV    Comment: heroin and meth      Allergies   Patient has no known allergies.   Review of Systems Review of Systems  All other systems reviewed and are negative.    Physical Exam Updated Vital Signs BP (!) 143/105   Pulse 80   Temp 98.1 F (36.7 C) (Oral)   Resp 16   SpO2 100%   Physical Exam Vitals signs and nursing note reviewed.  Constitutional:      Appearance: He is well-developed.  HENT:     Head: Normocephalic and atraumatic.  Eyes:     General: No scleral icterus.       Right eye: No discharge.        Left eye: No discharge.     Conjunctiva/sclera: Conjunctivae normal.     Pupils: Pupils are equal, round, and reactive to light.  Neck:     Musculoskeletal: Normal range of motion.     Vascular: No JVD.     Trachea: No tracheal deviation.  Pulmonary:     Effort: Pulmonary  effort is normal.     Breath sounds: No stridor.  Musculoskeletal:     Comments: Erythema noted to the left second digit, full active range of motion of the DIP PIP and MCP, tenderness to the dorsal ulnar hand no surrounding redness  Skin:    Comments: Track marks noted on the bilateral arms  Neurological:     Mental Status: He is alert and oriented to person, place, and time.     Coordination: Coordination normal.  Psychiatric:        Behavior: Behavior normal.        Thought Content: Thought content normal.        Judgment: Judgment normal.      ED Treatments / Results  Labs (all labs ordered are listed, but only abnormal results are displayed) Labs Reviewed  CBC WITH DIFFERENTIAL/PLATELET  BASIC METABOLIC PANEL    EKG None  Radiology No results found.  Procedures Procedures (including critical care time)  Medications Ordered in ED  Medications  doxycycline (VIBRA-TABS) tablet 100 mg (has no administration in time range)     Initial Impression / Assessment and Plan / ED Course  I have reviewed the triage vital signs and the nursing notes.  Pertinent labs & imaging results that were available during my care of the patient were reviewed by me and considered in my medical decision making (see chart for details).        Labs: CBC, BMP  Imaging:  Consults:  Therapeutics: Doxycycline  Discharge Meds: Doxycycline  Assessment/Plan: 43 year old male presents today with what appears to be cellulitis.  He has no signs of abscess or localized purulence.  Patient has no signs of systemic illness here.  He will be given a dose of doxycycline discharged home on doxycycline given strict return precautions.  He verbalized understanding and agreement to today's plan had no further questions or concerns at the time of discharge.    Final Clinical Impressions(s) / ED Diagnoses   Final diagnoses:  Cellulitis of finger of left hand    ED Discharge Orders         Ordered    doxycycline (VIBRAMYCIN) 100 MG capsule  2 times daily     11/25/18 1846           HedgesTinnie Gens, PA-C 11/25/18 1847    Geoffery Lyons, MD 11/25/18 2258

## 2018-11-25 NOTE — ED Triage Notes (Signed)
Pt. Stated, Im an IV drug user so Im not sure if that has anything to do with it, maybe cellulitis.

## 2018-12-25 ENCOUNTER — Emergency Department (HOSPITAL_COMMUNITY): Payer: Self-pay

## 2018-12-25 ENCOUNTER — Encounter (HOSPITAL_COMMUNITY): Payer: Self-pay | Admitting: *Deleted

## 2018-12-25 ENCOUNTER — Inpatient Hospital Stay (HOSPITAL_COMMUNITY)
Admission: EM | Admit: 2018-12-25 | Discharge: 2018-12-27 | DRG: 872 | Discharge status: Against Medical Advice | Payer: Self-pay | Attending: Internal Medicine | Admitting: Internal Medicine

## 2018-12-25 ENCOUNTER — Other Ambulatory Visit: Payer: Self-pay

## 2018-12-25 DIAGNOSIS — F1910 Other psychoactive substance abuse, uncomplicated: Secondary | ICD-10-CM | POA: Diagnosis present

## 2018-12-25 DIAGNOSIS — R030 Elevated blood-pressure reading, without diagnosis of hypertension: Secondary | ICD-10-CM | POA: Diagnosis present

## 2018-12-25 DIAGNOSIS — A419 Sepsis, unspecified organism: Principal | ICD-10-CM | POA: Diagnosis present

## 2018-12-25 DIAGNOSIS — F1110 Opioid abuse, uncomplicated: Secondary | ICD-10-CM | POA: Diagnosis present

## 2018-12-25 DIAGNOSIS — Z5329 Procedure and treatment not carried out because of patient's decision for other reasons: Secondary | ICD-10-CM | POA: Diagnosis present

## 2018-12-25 DIAGNOSIS — F1510 Other stimulant abuse, uncomplicated: Secondary | ICD-10-CM | POA: Diagnosis present

## 2018-12-25 DIAGNOSIS — E876 Hypokalemia: Secondary | ICD-10-CM | POA: Diagnosis present

## 2018-12-25 DIAGNOSIS — G8929 Other chronic pain: Secondary | ICD-10-CM | POA: Diagnosis present

## 2018-12-25 DIAGNOSIS — R7982 Elevated C-reactive protein (CRP): Secondary | ICD-10-CM | POA: Diagnosis present

## 2018-12-25 DIAGNOSIS — Z20828 Contact with and (suspected) exposure to other viral communicable diseases: Secondary | ICD-10-CM | POA: Diagnosis present

## 2018-12-25 DIAGNOSIS — M869 Osteomyelitis, unspecified: Secondary | ICD-10-CM | POA: Diagnosis present

## 2018-12-25 DIAGNOSIS — Y33XXXS Other specified events, undetermined intent, sequela: Secondary | ICD-10-CM | POA: Diagnosis present

## 2018-12-25 DIAGNOSIS — M009 Pyogenic arthritis, unspecified: Secondary | ICD-10-CM | POA: Diagnosis present

## 2018-12-25 DIAGNOSIS — M25512 Pain in left shoulder: Secondary | ICD-10-CM | POA: Diagnosis present

## 2018-12-25 HISTORY — DX: Other psychoactive substance abuse, uncomplicated: F19.10

## 2018-12-25 LAB — CBC WITH DIFFERENTIAL/PLATELET
Abs Immature Granulocytes: 0.05 10*3/uL (ref 0.00–0.07)
Basophils Absolute: 0.1 10*3/uL (ref 0.0–0.1)
Basophils Relative: 0 %
Eosinophils Absolute: 0 10*3/uL (ref 0.0–0.5)
Eosinophils Relative: 0 %
HCT: 39 % (ref 39.0–52.0)
Hemoglobin: 13 g/dL (ref 13.0–17.0)
Immature Granulocytes: 0 %
Lymphocytes Relative: 17 %
Lymphs Abs: 2.6 10*3/uL (ref 0.7–4.0)
MCH: 27.3 pg (ref 26.0–34.0)
MCHC: 33.3 g/dL (ref 30.0–36.0)
MCV: 81.9 fL (ref 80.0–100.0)
Monocytes Absolute: 1.9 10*3/uL — ABNORMAL HIGH (ref 0.1–1.0)
Monocytes Relative: 12 %
Neutro Abs: 10.8 10*3/uL — ABNORMAL HIGH (ref 1.7–7.7)
Neutrophils Relative %: 71 %
Platelets: 364 10*3/uL (ref 150–400)
RBC: 4.76 MIL/uL (ref 4.22–5.81)
RDW: 12.8 % (ref 11.5–15.5)
WBC: 15.4 10*3/uL — ABNORMAL HIGH (ref 4.0–10.5)
nRBC: 0 % (ref 0.0–0.2)

## 2018-12-25 LAB — BASIC METABOLIC PANEL
Anion gap: 13 (ref 5–15)
BUN: 12 mg/dL (ref 6–20)
CO2: 24 mmol/L (ref 22–32)
Calcium: 9 mg/dL (ref 8.9–10.3)
Chloride: 98 mmol/L (ref 98–111)
Creatinine, Ser: 0.51 mg/dL — ABNORMAL LOW (ref 0.61–1.24)
GFR calc Af Amer: >60 mL/min (ref >60)
GFR calc non Af Amer: >60 mL/min (ref >60)
Glucose, Bld: 154 mg/dL — ABNORMAL HIGH (ref 70–99)
Potassium: 3.1 mmol/L — ABNORMAL LOW (ref 3.5–5.1)
Sodium: 135 mmol/L (ref 135–145)

## 2018-12-25 LAB — TROPONIN I (HIGH SENSITIVITY)
Troponin I (High Sensitivity): 12 ng/L (ref <18)
Troponin I (High Sensitivity): 13 ng/L (ref <18)

## 2018-12-25 LAB — CBC
HCT: 44 % (ref 39.0–52.0)
Hemoglobin: 14.4 g/dL (ref 13.0–17.0)
MCH: 27.2 pg (ref 26.0–34.0)
MCHC: 32.7 g/dL (ref 30.0–36.0)
MCV: 83 fL (ref 80.0–100.0)
Platelets: 356 10*3/uL (ref 150–400)
RBC: 5.3 MIL/uL (ref 4.22–5.81)
RDW: 13 % (ref 11.5–15.5)
WBC: 13.8 10*3/uL — ABNORMAL HIGH (ref 4.0–10.5)
nRBC: 0 % (ref 0.0–0.2)

## 2018-12-25 LAB — URINALYSIS, ROUTINE W REFLEX MICROSCOPIC
Bilirubin Urine: NEGATIVE
Glucose, UA: NEGATIVE mg/dL
Hgb urine dipstick: NEGATIVE
Ketones, ur: NEGATIVE mg/dL
Leukocytes,Ua: NEGATIVE
Nitrite: NEGATIVE
Protein, ur: NEGATIVE mg/dL
Specific Gravity, Urine: 1.021 (ref 1.005–1.030)
pH: 7 (ref 5.0–8.0)

## 2018-12-25 LAB — HEPATIC FUNCTION PANEL
ALT: 14 U/L (ref 0–44)
AST: 13 U/L — ABNORMAL LOW (ref 15–41)
Albumin: 3 g/dL — ABNORMAL LOW (ref 3.5–5.0)
Alkaline Phosphatase: 72 U/L (ref 38–126)
Bilirubin, Direct: <0.1 mg/dL (ref 0.0–0.2)
Total Bilirubin: 0.1 mg/dL — ABNORMAL LOW (ref 0.3–1.2)
Total Protein: 7.1 g/dL (ref 6.5–8.1)

## 2018-12-25 LAB — RAPID URINE DRUG SCREEN, HOSP PERFORMED
Amphetamines: POSITIVE — AB
Barbiturates: NOT DETECTED
Benzodiazepines: NOT DETECTED
Cocaine: NOT DETECTED
Opiates: POSITIVE — AB
Tetrahydrocannabinol: NOT DETECTED

## 2018-12-25 LAB — SEDIMENTATION RATE: Sed Rate: 55 mm/hr — ABNORMAL HIGH (ref 0–16)

## 2018-12-25 LAB — GLUCOSE, CAPILLARY: Glucose-Capillary: 135 mg/dL — ABNORMAL HIGH (ref 70–99)

## 2018-12-25 LAB — CREATININE, SERUM
Creatinine, Ser: 0.64 mg/dL (ref 0.61–1.24)
GFR calc Af Amer: >60 mL/min (ref >60)
GFR calc non Af Amer: >60 mL/min (ref >60)

## 2018-12-25 LAB — SARS CORONAVIRUS 2 (TAT 6-24 HRS): SARS Coronavirus 2: NEGATIVE

## 2018-12-25 LAB — C-REACTIVE PROTEIN: CRP: 12.3 mg/dL — ABNORMAL HIGH (ref <1.0)

## 2018-12-25 LAB — HIV ANTIBODY (ROUTINE TESTING W REFLEX): HIV Screen 4th Generation wRfx: NONREACTIVE

## 2018-12-25 MED ORDER — LORAZEPAM 2 MG/ML IJ SOLN
1.0000 mg | Freq: Once | INTRAMUSCULAR | Status: AC
  Administered 2018-12-25: 10:00:00 1 mg via INTRAVENOUS
  Filled 2018-12-25: qty 1

## 2018-12-25 MED ORDER — ONDANSETRON HCL 4 MG PO TABS: 4.0000 mg | ORAL_TABLET | Freq: Four times a day (QID) | ORAL | Status: DC | PRN

## 2018-12-25 MED ORDER — IOHEXOL 350 MG/ML SOLN
75.0000 mL | Freq: Once | INTRAVENOUS | Status: AC | PRN
  Administered 2018-12-25: 75 mL via INTRAVENOUS

## 2018-12-25 MED ORDER — SODIUM CHLORIDE 0.9% FLUSH: 3.0000 mL | Freq: Once | INTRAVENOUS | Status: DC

## 2018-12-25 MED ORDER — ACETAMINOPHEN 325 MG PO TABS
650.0000 mg | ORAL_TABLET | Freq: Four times a day (QID) | ORAL | Status: DC | PRN
  Administered 2018-12-25 – 2018-12-26 (×3): 650 mg via ORAL
  Filled 2018-12-25 (×3): qty 2

## 2018-12-25 MED ORDER — SODIUM CHLORIDE 0.9 % IV BOLUS
1000.0000 mL | Freq: Once | INTRAVENOUS | Status: AC
  Administered 2018-12-25: 1000 mL via INTRAVENOUS

## 2018-12-25 MED ORDER — ENOXAPARIN SODIUM 40 MG/0.4ML ~~LOC~~ SOLN
40.0000 mg | SUBCUTANEOUS | Status: DC
  Administered 2018-12-25 – 2018-12-26 (×2): 40 mg via SUBCUTANEOUS
  Filled 2018-12-25 (×2): qty 0.4

## 2018-12-25 MED ORDER — ACETAMINOPHEN 650 MG RE SUPP: 650.0000 mg | Freq: Four times a day (QID) | RECTAL | Status: DC | PRN

## 2018-12-25 MED ORDER — POTASSIUM CHLORIDE IN NACL 20-0.9 MEQ/L-% IV SOLN
INTRAVENOUS | Status: DC
  Administered 2018-12-25 – 2018-12-27 (×4): via INTRAVENOUS
  Filled 2018-12-25 (×4): qty 1000

## 2018-12-25 MED ORDER — ONDANSETRON HCL 4 MG/2ML IJ SOLN: 4.0000 mg | Freq: Four times a day (QID) | INTRAMUSCULAR | Status: DC | PRN

## 2018-12-25 MED ORDER — MORPHINE SULFATE (PF) 2 MG/ML IV SOLN: 2.0000 mg | INTRAVENOUS | Status: DC | PRN

## 2018-12-25 NOTE — ED Triage Notes (Signed)
Pt states tx at [facility] for L shoulder pain with neg xray.  States pain increased greatly in last 12 hours.  Swelling noted to L collar bone.  Pt is sob with hr 128.  Pt is IV drug user.

## 2018-12-25 NOTE — Progress Notes (Signed)
Pt was sleep in the room and came to give Pt his food since the diet order was changed. Called the Pt name 3 times and no answer. Found needle on the bed with red fluid in the inside. Pt finally got up and stated he attempted to give him self a shot but he decided not to and passed out. Pt stated he passed out because he hasn't slept in 3 days. Paged [facility] awaiting response. IV was unhooked as well. PT stated the IV was already unhooked because he was going down for MRI.

## 2018-12-25 NOTE — ED Notes (Signed)
Patient transported to CT 

## 2018-12-25 NOTE — H&P (Addendum)
History and Physical    Logan Fisher KDT:[id number] DOB: 01-Apr-1975 DOA: 12/25/2018  PCP: Heywood Bene, PA-C  Patient coming from: Home I have personally briefly reviewed patient's old medical records in [place]  Chief Complaint: Chest pain  HPI: Logan Fisher is a 43 y.o. male with medical history significant of IV drug abuse presents to emergency department due to sudden onset of central chest pain and back pain since 3 to 4 days.  Patient reports worsening of symptoms this morning and associated with swelling of anterior chest.  He was seen at an urgent care recently and was given oral prednisone.  No history of fever, chills, nausea, vomiting, numbness weakness tingling sensation in upper or lower extremities.  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  No history of endocarditis.  Upon my evaluation: Patient received IV Ativan in the ED.  Patient was very sleepy-arousable  But goes back to sleep.  ED Course: Upon arrival: Patient was tachycardic, elevated blood pressure, troponin: WNL x2, sed rate 55, CRP: 12.3.  CBC shows leukocytosis of 15.4.  CMP: Shows potassium of 3.1.  Blood culture was obtained and is pending.  EDP consulted cardiothoracic surgery for possible joint drainage.  Review of Systems: As per HPI otherwise negative.    Past Medical History:  Diagnosis Date  . AC (acromioclavicular) joint bone spurs   . Cellulitis   . Drug abuse ([place])   . Environmental allergies   . Hiatal hernia   . Hypercholesteremia   . Hypertension     Past Surgical History:  Procedure Laterality Date  . HERNIA REPAIR       reports that he has never smoked. He has never used smokeless tobacco. He reports current alcohol use. He reports current drug use. Drugs: Methamphetamines, IV, and Cocaine.  No Known Allergies  No family history on file.  Prior to Admission medications   Medication Sig Start Date End Date Taking? Authorizing Provider   cyclobenzaprine (FLEXERIL) 10 MG tablet Take 10 mg by mouth at bedtime as needed for muscle spasms. 12/23/18 01/02/19 Yes [provider]  predniSONE (DELTASONE) 20 MG tablet Take 40 mg by mouth daily. For 3-5 days   Yes [provider]    Physical Exam: Vitals:   12/25/18 1000 12/25/18 1015 12/25/18 1030 12/25/18 1100  BP: (!) 140/97 (!) 144/107 (!) 160/103 (!) 158/106  Pulse: (!) 112 (!) 117 (!) 109   Resp: 17 20 19 17   Temp:      TempSrc:      SpO2: 97% 98% 98%   Weight:      Height:        Constitutional: NAD, calm, comfortable Vitals:   12/25/18 1000 12/25/18 1015 12/25/18 1030 12/25/18 1100  BP: (!) 140/97 (!) 144/107 (!) 160/103 (!) 158/106  Pulse: (!) 112 (!) 117 (!) 109   Resp: 17 20 19 17   Temp:      TempSrc:      SpO2: 97% 98% 98%   Weight:      Height:       Constitutional: Sleepy and drowsy -arousable but not able to talk.  He does not seems to be in acute distress.  He is maintaining oxygen saturation on room air.   Eyes: PERRL, lids and conjunctivae normal ENMT: Mucous membranes are dry. Posterior pharynx clear of any exudate or lesions.Normal dentition.  Neck: normal, supple, no masses, no thyromegaly Respiratory: clear to auscultation bilaterally, no wheezing, no  crackles. Normal respiratory effort. No accessory muscle use.  Cardiovascular: Tachycardic, no murmurs / rubs / gallops.  Anterior chest pain noted on palpation.  Anterior chest swelling noted.  Nonerythematous.  No extremity edema. 2+ pedal pulses. No carotid bruits.  Abdomen: no tenderness, no masses palpated. No hepatosplenomegaly. Bowel sounds positive.  Musculoskeletal: no clubbing / cyanosis. No joint deformity upper and lower extremities. Good ROM, no contractures. Normal muscle tone.  Skin: no rashes, lesions, ulcers. No induration Neurologic: Unable to perform neurological exam as patient was drowsy Labs on Admission: I have personally reviewed following labs and imaging  studies  CBC: Recent Labs  Lab 12/25/18 0924  WBC 15.4*  NEUTROABS 10.8*  HGB 13.0  HCT 39.0  MCV 81.9  PLT 563   Basic Metabolic Panel: Recent Labs  Lab 12/25/18 0924  NA 135  K 3.1*  CL 98  CO2 24  GLUCOSE 154*  BUN 12  CREATININE 0.51*  CALCIUM 9.0   GFR: Estimated Creatinine Clearance: 137.7 mL/min (A) (by C-G formula based on SCr of 0.51 mg/dL (L)). Liver Function Tests: Recent Labs  Lab 12/25/18 0924  AST 13*  ALT 14  ALKPHOS 72  BILITOT 0.1*  PROT 7.1  ALBUMIN 3.0*   No results for input(s): LIPASE, AMYLASE in the last 168 hours. No results for input(s): AMMONIA in the last 168 hours. Coagulation Profile: No results for input(s): INR, PROTIME in the last 168 hours. Cardiac Enzymes: No results for input(s): CKTOTAL, CKMB, CKMBINDEX, TROPONINI in the last 168 hours. BNP (last 3 results) No results for input(s): PROBNP in the last 8760 hours. HbA1C: No results for input(s): HGBA1C in the last 72 hours. CBG: No results for input(s): GLUCAP in the last 168 hours. Lipid Profile: No results for input(s): CHOL, HDL, LDLCALC, TRIG, CHOLHDL, LDLDIRECT in the last 72 hours. Thyroid Function Tests: No results for input(s): TSH, T4TOTAL, FREET4, T3FREE, THYROIDAB in the last 72 hours. Anemia Panel: No results for input(s): VITAMINB12, FOLATE, FERRITIN, TIBC, IRON, RETICCTPCT in the last 72 hours. Urine analysis: No results found for: COLORURINE, APPEARANCEUR, [place], [place], GLUCOSEU, HGBUR, BILIRUBINUR, KETONESUR, PROTEINUR, UROBILINOGEN, NITRITE, LEUKOCYTESUR  Radiological Exams on Admission: Ct Chest W Contrast  Result Date: 12/25/2018 CLINICAL DATA:  Chest pain. EXAM: CT CHEST WITH CONTRAST TECHNIQUE: Multidetector CT imaging of the chest was performed during intravenous contrast administration. CONTRAST:  78m OMNIPAQUE IOHEXOL 350 MG/ML SOLN COMPARISON:  Same day. FINDINGS: Cardiovascular: No significant vascular findings. Normal heart size. No  pericardial effusion. Mediastinum/Nodes: Mildly enlarged left supraclavicular lymph nodes are noted with the largest measuring 12 mm. These most likely are inflammatory in etiology. Thyroid gland, trachea, and esophagus demonstrate no significant findings. Lungs/Pleura: No pneumothorax or pleural effusion is noted. Minimal bilateral posterior basilar subsegmental atelectasis is noted. Upper Abdomen: No acute abnormality. Musculoskeletal: There is noted abnormal ill-defined soft tissue density anterior and posterior to the left first costosternal joint. This is concerning for septic arthritis with surrounding inflammation. No definite fluid collection or abscess is noted. There does appear to be some lytic destruction involving the manubrial surface of the joint suggesting osteomyelitis. IMPRESSION: Abnormal ill-defined soft tissue density is noted anterior and posterior to the left first costosternal joint with possible lytic destruction involving the manubrial surface of the joint, suggesting septic arthritis. MRI is recommended for further evaluation. Mildly enlarged left supraclavicular lymph nodes are noted which most likely are inflammatory in etiology given the above findings. Minimal bilateral posterior basilar subsegmental atelectasis. Electronically Signed   By: JJeneen Rinks  Murlean Caller M.D.   On: 12/25/2018 11:54   Dg Chest Portable 1 View  Result Date: 12/25/2018 CLINICAL DATA:  43 year old male with pain and swelling in the left shoulder and left collarbone. EXAM: PORTABLE CHEST 1 VIEW COMPARISON:  Chest radiograph dated 12/28/2017 FINDINGS: There is an area of opacity in the left upper lobe medially and abutting the mediastinum which may represent atelectasis or infiltrate. A mass, fluid collection, or hematoma is not excluded. There is slight shift of the trachea to the right of the midline. This can be better evaluated with CT if clinically indicated. There is mild eventration of the left hemidiaphragm.  The right lung is clear. There is no pleural effusion or pneumothorax. The cardiac silhouette is within normal limits. No acute osseous pathology. IMPRESSION: Ill-defined area of opacity in the left upper lobe medially may represent atelectasis or infiltrate. A mass, fluid collection, or hematoma is not excluded. CT may provide better evaluation if clinically indicated. Electronically Signed   By: Anner Crete M.D.   On: 12/25/2018 09:19    EKG: Sinus tachycardia, T wave inversion noted in lead II, III and aVF.  Assessment/Plan Active Problems:   Septic arthritis (HCC)   IV drug abuse (HCC)   Hypokalemia   Septic arthritis: -Of left first costosternal joint -CT chest with contrast as above.  Patient has history of IV drug abuse. -We will admit patient at stepdown unit for close monitoring. -On telemetry.  Troponin negative x2.  Started on IV fluids. - Blood culture is obtained and is pending.  CRP elevated at 12.3, ESR: 55. -EDP consulted cardiothoracic surgery for possible joint draining? -I talked to cardiothoracic surgery PA-she mentioned that no surgical intervention will be done by cardiothoracic surgery.  -I discussed patient with ID-Dr. Windy Kalata recommended MRI of the chest and hold antibiotics until MRI is back. Will consult IR for possible fluid drainage for appropriate antibiotic management. -Start on morphine as needed only for severe pain. -Monitor his vitals closely.  Hypokalemia: -Replenished.  Repeat BMP tomorrow a.m.  IV drug abuse: -History of IV heroin and methamphetamine. -Shin drowsy in ED likely due to Ativan.  We will keep him n.p.o. until he passes the bedside swallow evaluation. -Check urine drug screen.  DVT prophylaxis: TED/SCD/Lovenox  code Status: Full code  family Communication: None present at bedside.  Plan of care discussed with patient in length and he verbalized understanding and agreed with it. Disposition Plan: TBD Consults called:  Cardiothoracic surgery by EDP Admission status: Inpatient at the stepdown unit.    Mckinley Jewel MD [facility] Pager [phone]  If 7PM-7AM, please contact night-coverage [email or web address] Password [facility]  12/25/2018, 12:49 PM

## 2018-12-25 NOTE — ED Provider Notes (Signed)
[facility] EMERGENCY DEPARTMENT Provider Note   CSN: [id number] Arrival date & time: 12/25/18  0844     History   Chief Complaint Chief Complaint  Patient presents with   Shoulder Pain    HPI ADRAIN NESBIT is a 43 y.o. male.     HPI Patient presents with pain in his left shoulder/chest area.  Began around 4 days ago.  Began after sleeping in a hotel room.  States initially started on his trapezius on the left side and now works down.  States he got seen at an urgent care and was given prednisone and had an x-ray.  States that now he has more pain on his anterior chest and there is swelling.  No fevers.  No numbness or weakness.  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  Last used around 5 in the morning.  States he is very nervous now has had skin infections before not been treated for endocarditis.  States he also feels that he cannot catch his breath. Past Medical History:  Diagnosis Date   AC (acromioclavicular) joint bone spurs    Cellulitis    Drug abuse ([place])    Environmental allergies    Hiatal hernia    Hypercholesteremia    Hypertension     There are no active problems to display for this patient.   Past Surgical History:  Procedure Laterality Date   HERNIA REPAIR          Home Medications    Prior to Admission medications   Medication Sig Start Date End Date Taking? Authorizing Provider  cyclobenzaprine (FLEXERIL) 10 MG tablet Take 10 mg by mouth at bedtime as needed for muscle spasms. 12/23/18 01/02/19 Yes [provider]  predniSONE (DELTASONE) 20 MG tablet Take 40 mg by mouth daily. For 3-5 days   Yes [provider]    Family History No family history on file.  Social History Social History   Tobacco Use   Smoking status: Never Smoker   Smokeless tobacco: Never Used  Substance Use Topics   Alcohol use: Yes    Comment: weekly   Drug use: Yes    Types: Methamphetamines,  IV, Cocaine    Comment: heroin and meth      Allergies   Patient has no known allergies.   Review of Systems Review of Systems  Constitutional: Positive for fatigue. Negative for appetite change and fever.  HENT: Negative for congestion.   Respiratory: Positive for shortness of breath.   Cardiovascular: Positive for chest pain.  Gastrointestinal: Negative for abdominal pain.  Musculoskeletal: Negative for neck pain.       Left shoulder/upper back pain.  Skin: Positive for wound.  Neurological: Negative for weakness.  Psychiatric/Behavioral: Negative for confusion.     Physical Exam Updated Vital Signs BP (!) 158/106    Pulse (!) 109    Temp 98.8 F (37.1 C) (Oral)    Resp 17    Ht 6\' 5"  (1.956 m)    Wt 83.5 kg    SpO2 98%    BMI 21.82 kg/m   Physical Exam Vitals signs and nursing note reviewed.  HENT:     Head: Normocephalic.  Eyes:     Extraocular Movements: Extraocular movements intact.  Cardiovascular:     Rate and Rhythm: Tachycardia present.     Heart sounds: No murmur.  Pulmonary:     Effort: Pulmonary effort is normal.  Chest:  Chest wall: Tenderness present.  Abdominal:     Tenderness: There is no abdominal tenderness.  Musculoskeletal:     Comments: Good range of motion left shoulder.  Mild tenderness over left trapezius but is much more tender over left sternoclavicular joint with some swelling in the area.  No erythema.  Skin:    Capillary Refill: Capillary refill takes less than 2 seconds.     Comments: Track marks along anterior upper arms bilaterally.  No lesions seen in fingernails or on palms.  Neurological:     Mental Status: He is alert and oriented to person, place, and time.  Psychiatric:     Comments: Patient appears somewhat nervous      ED Treatments / Results  Labs (all labs ordered are listed, but only abnormal results are displayed) Labs Reviewed  BASIC METABOLIC PANEL - Abnormal; Notable for the following components:       Result Value   Potassium 3.1 (*)    Glucose, Bld 154 (*)    Creatinine, Ser 0.51 (*)    All other components within normal limits  HEPATIC FUNCTION PANEL - Abnormal; Notable for the following components:   Albumin 3.0 (*)    AST 13 (*)    Total Bilirubin 0.1 (*)    All other components within normal limits  SEDIMENTATION RATE - Abnormal; Notable for the following components:   Sed Rate 55 (*)    All other components within normal limits  C-REACTIVE PROTEIN - Abnormal; Notable for the following components:   CRP 12.3 (*)    All other components within normal limits  CBC WITH DIFFERENTIAL/PLATELET - Abnormal; Notable for the following components:   WBC 15.4 (*)    Neutro Abs 10.8 (*)    Monocytes Absolute 1.9 (*)    All other components within normal limits  CULTURE, BLOOD (ROUTINE X 2)  CULTURE, BLOOD (ROUTINE X 2)  SARS CORONAVIRUS 2 (TAT 6-24 HRS)  TROPONIN I (HIGH SENSITIVITY)  TROPONIN I (HIGH SENSITIVITY)    EKG EKG Interpretation  Date/Time:  Wednesday December 25 2018 08:51:49 EDT Ventricular Rate:  130 PR Interval:  124 QRS Duration: 96 QT Interval:  308 QTC Calculation: 453 R Axis:   25 Text Interpretation:  Sinus tachycardia ST & T wave abnormality, consider inferior ischemia Abnormal ECG Confirmed by Benjiman Core [phone]) on 12/25/2018 10:41:27 AM   Radiology Ct Chest W Contrast  Result Date: 12/25/2018 CLINICAL DATA:  Chest pain. EXAM: CT CHEST WITH CONTRAST TECHNIQUE: Multidetector CT imaging of the chest was performed during intravenous contrast administration. CONTRAST:  60mL OMNIPAQUE IOHEXOL 350 MG/ML SOLN COMPARISON:  Same day. FINDINGS: Cardiovascular: No significant vascular findings. Normal heart size. No pericardial effusion. Mediastinum/Nodes: Mildly enlarged left supraclavicular lymph nodes are noted with the largest measuring 12 mm. These most likely are inflammatory in etiology. Thyroid gland, trachea, and esophagus demonstrate no significant  findings. Lungs/Pleura: No pneumothorax or pleural effusion is noted. Minimal bilateral posterior basilar subsegmental atelectasis is noted. Upper Abdomen: No acute abnormality. Musculoskeletal: There is noted abnormal ill-defined soft tissue density anterior and posterior to the left first costosternal joint. This is concerning for septic arthritis with surrounding inflammation. No definite fluid collection or abscess is noted. There does appear to be some lytic destruction involving the manubrial surface of the joint suggesting osteomyelitis. IMPRESSION: Abnormal ill-defined soft tissue density is noted anterior and posterior to the left first costosternal joint with possible lytic destruction involving the manubrial surface of the joint, suggesting septic arthritis. MRI  is recommended for further evaluation. Mildly enlarged left supraclavicular lymph nodes are noted which most likely are inflammatory in etiology given the above findings. Minimal bilateral posterior basilar subsegmental atelectasis. Electronically Signed   By: Lupita RaiderJames  Green Jr M.D.   On: 12/25/2018 11:54   Dg Chest Portable 1 View  Result Date: 12/25/2018 CLINICAL DATA:  43 year old male with pain and swelling in the left shoulder and left collarbone. EXAM: PORTABLE CHEST 1 VIEW COMPARISON:  Chest radiograph dated 12/28/2017 FINDINGS: There is an area of opacity in the left upper lobe medially and abutting the mediastinum which may represent atelectasis or infiltrate. A mass, fluid collection, or hematoma is not excluded. There is slight shift of the trachea to the right of the midline. This can be better evaluated with CT if clinically indicated. There is mild eventration of the left hemidiaphragm. The right lung is clear. There is no pleural effusion or pneumothorax. The cardiac silhouette is within normal limits. No acute osseous pathology. IMPRESSION: Ill-defined area of opacity in the left upper lobe medially may represent atelectasis or  infiltrate. A mass, fluid collection, or hematoma is not excluded. CT may provide better evaluation if clinically indicated. Electronically Signed   By: Elgie CollardArash  Radparvar M.D.   On: 12/25/2018 09:19    Procedures Procedures (including critical care time)  Medications Ordered in ED Medications  sodium chloride flush (NS) 0.9 % injection 3 mL (3 mLs Intravenous Not Given 12/25/18 0956)  sodium chloride 0.9 % bolus 1,000 mL (0 mLs Intravenous Stopped 12/25/18 1113)  LORazepam (ATIVAN) injection 1 mg (1 mg Intravenous Given 12/25/18 0931)  iohexol (OMNIPAQUE) 350 MG/ML injection 75 mL (75 mLs Intravenous Contrast Given 12/25/18 1139)     Initial Impression / Assessment and Plan / ED Course  I have reviewed the triage vital signs and the nursing notes.  Pertinent labs & imaging results that were available during my care of the patient were reviewed by me and considered in my medical decision making (see chart for details).        Patient presents with pain in his chest.  IV drug user.  I think likely septic arthritis.  X-ray showed soft tissue abnormality and CT scan shows likely septic arthritis at the first rib and sternum joint.  No fever here.  Sed rate and CRP are elevated.  Do not think he is septic at this point.  Patient's primary care with [facility].  Will discuss with hospitalist about admission.  Final Clinical Impressions(s) / ED Diagnoses   Final diagnoses:  Septic arthritis, due to unspecified organism, septic arthritis of unspecified location [facility](HCC)  IV drug abuse [facility](HCC)    ED Discharge Orders    None       Benjiman CorePickering, Leilanie Rauda, MD 12/25/18 1202

## 2018-12-25 NOTE — Progress Notes (Signed)
Callback from [place] and stated if the patient was to not wake again that we can give him Narcan. Security came to search Pt belongings and found multiple pills in his name, multiple syringes, and other drug use equipment. Security took all of the drugs plus the equipment. Pt is alert and aware. AC and Charge are also aware of situation. Safety zone completed.

## 2018-12-25 NOTE — Plan of Care (Signed)
Progressing towards goals

## 2018-12-26 ENCOUNTER — Inpatient Hospital Stay (HOSPITAL_COMMUNITY): Payer: Self-pay

## 2018-12-26 DIAGNOSIS — E876 Hypokalemia: Secondary | ICD-10-CM

## 2018-12-26 DIAGNOSIS — F1910 Other psychoactive substance abuse, uncomplicated: Secondary | ICD-10-CM

## 2018-12-26 LAB — CBC
HCT: 41.8 % (ref 39.0–52.0)
Hemoglobin: 13.5 g/dL (ref 13.0–17.0)
MCH: 26.5 pg (ref 26.0–34.0)
MCHC: 32.3 g/dL (ref 30.0–36.0)
MCV: 82 fL (ref 80.0–100.0)
Platelets: 364 10*3/uL (ref 150–400)
RBC: 5.1 MIL/uL (ref 4.22–5.81)
RDW: 12.9 % (ref 11.5–15.5)
WBC: 14.2 10*3/uL — ABNORMAL HIGH (ref 4.0–10.5)
nRBC: 0 % (ref 0.0–0.2)

## 2018-12-26 LAB — BASIC METABOLIC PANEL
Anion gap: 10 (ref 5–15)
BUN: 6 mg/dL (ref 6–20)
CO2: 25 mmol/L (ref 22–32)
Calcium: 8.7 mg/dL — ABNORMAL LOW (ref 8.9–10.3)
Chloride: 103 mmol/L (ref 98–111)
Creatinine, Ser: 0.69 mg/dL (ref 0.61–1.24)
GFR calc Af Amer: >60 mL/min (ref >60)
GFR calc non Af Amer: >60 mL/min (ref >60)
Glucose, Bld: 108 mg/dL — ABNORMAL HIGH (ref 70–99)
Potassium: 4.1 mmol/L (ref 3.5–5.1)
Sodium: 138 mmol/L (ref 135–145)

## 2018-12-26 MED ORDER — MORPHINE SULFATE (PF) 2 MG/ML IV SOLN
2.0000 mg | INTRAVENOUS | Status: DC | PRN
  Administered 2018-12-26 – 2018-12-27 (×6): 2 mg via INTRAVENOUS
  Filled 2018-12-26 (×6): qty 1

## 2018-12-26 MED ORDER — VANCOMYCIN HCL 10 G IV SOLR
2000.0000 mg | Freq: Once | INTRAVENOUS | Status: AC
  Administered 2018-12-26: 2000 mg via INTRAVENOUS
  Filled 2018-12-26: qty 2000

## 2018-12-26 MED ORDER — ENOXAPARIN SODIUM 40 MG/0.4ML ~~LOC~~ SOLN: 40.0000 mg | SUBCUTANEOUS | Status: DC

## 2018-12-26 MED ORDER — SENNOSIDES-DOCUSATE SODIUM 8.6-50 MG PO TABS
2.0000 | ORAL_TABLET | Freq: Two times a day (BID) | ORAL | Status: DC
  Administered 2018-12-26 (×2): 2 via ORAL
  Filled 2018-12-26 (×3): qty 2

## 2018-12-26 MED ORDER — PIPERACILLIN-TAZOBACTAM 3.375 G IVPB
3.3750 g | Freq: Three times a day (TID) | INTRAVENOUS | Status: DC
  Administered 2018-12-27 (×2): 3.375 g via INTRAVENOUS
  Filled 2018-12-26 (×4): qty 50

## 2018-12-26 MED ORDER — INFLUENZA VAC SPLIT QUAD 0.5 ML IM SUSY: 0.5000 mL | PREFILLED_SYRINGE | INTRAMUSCULAR | Status: DC

## 2018-12-26 MED ORDER — PIPERACILLIN-TAZOBACTAM 3.375 G IVPB
3.3750 g | Freq: Three times a day (TID) | INTRAVENOUS | Status: DC
  Administered 2018-12-26 (×2): 3.375 g via INTRAVENOUS
  Filled 2018-12-26 (×4): qty 50

## 2018-12-26 MED ORDER — GADOBUTROL 1 MMOL/ML IV SOLN
8.0000 mL | Freq: Once | INTRAVENOUS | Status: AC | PRN
  Administered 2018-12-26: 8 mL via INTRAVENOUS

## 2018-12-26 MED ORDER — VANCOMYCIN HCL IN DEXTROSE 1-5 GM/200ML-% IV SOLN
1000.0000 mg | Freq: Three times a day (TID) | INTRAVENOUS | Status: DC
  Administered 2018-12-26 – 2018-12-27 (×4): 1000 mg via INTRAVENOUS
  Filled 2018-12-26 (×6): qty 200

## 2018-12-26 NOTE — Progress Notes (Signed)
Pt medications sent to pharmacy: Prednisone 20mg , Doxycycline 100mg , Sildenafil 100mg , Cyclobenzaprine 10 mg, and Albuterol Sulfate HFA.

## 2018-12-26 NOTE — Progress Notes (Signed)
PROGRESS NOTE  CANTON [name]:[id number] DOB: 12/21/1975 DOA: 12/25/2018 PCP: Heywood Bene, PA-C  HPI/Recap of past 24 hours: : Logan Fisher is a 43 y.o. male with medical history significant of IV drug abuse presents to emergency department due to sudden onset of central chest pain and back pain since 3 to 4 days.  Patient reports worsening of symptoms this morning and associated with swelling of anterior chest.  He was seen at an urgent care recently and was given oral prednisone.  No history of fever, chills, nausea, vomiting, numbness weakness tingling sensation in upper or lower extremities.  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  No history of endocarditis.  Upon my evaluation: Patient received IV Ativan in the ED.  Patient was very sleepy-arousable  But goes back to sleep.  ED Course: Upon arrival: Patient was tachycardic, elevated blood pressure, troponin: WNL x2, sed rate 55, CRP: 12.3.  CBC shows leukocytosis of 15.4.  CMP: Shows potassium of 3.1.  Blood culture was obtained and is pending.  EDP consulted cardiothoracic surgery for possible joint drainage.  Review of Systems: As per HPI otherwise negative.   12/26/18: Patient was seen and examined at his bedside this morning.  He reports significant pain on the left side of his upper chest and left shoulder.  Reports injury on his left shoulder in 2019.  States he was lifting boxes.   Assessment/Plan: Active Problems:   Septic arthritis (HCC)   IV drug abuse ([place])   Hypokalemia  Sepsis secondary to presumed left first costosternal joint septic arthritis Presented with leukocytosis, tachycardia and abnormal finding on CT chest with contrast CT chest done on admission showed  Abnormal ill-defined soft tissue density is noted anterior and posterior to the left first costosternal joint with possible lytic destruction involving the manubrial surface of the joint, suggesting septic arthritis.  Start IV antibiotics empirically IV vancomycin and IV Zosyn Obtain MRSA screening Blood cultures x2 peripherally in process IR consult for possible joint aspiration, will send for analysis Monitor fever curve and WBC Independently reviewed CT chest with contrast done on admission showing a lesion in the left first costosternal joint area. Obtain MRI chest with and without contrast to further assess lesion  IV drug abuse with heroin and methamphetamine UDS positive for above on 12/25/2018 States he has been using for a year less than 1/4 g/day Receptive to sobriety and starting methadone if no surgery planned Polysubstance abuse cessation counseling done at bedside Obtain HIV screening and acute hepatitis screening  Transient elevated blood pressure Possibly related to pain Not on antihypertensives at home Home IV morphine 2 mg every 3 hours as needed for severe pain  Chronic left shoulder pain post injury in 2019 Symptomatic management    DVT prophylaxis: TED/SCD/Lovenox subcu daily code Status: Full code  family Communication: None present at bedside.  Plan of care discussed with patient in length and he verbalized understanding and agreed with it.   Disposition Plan:  Patient is currently not appropriate for discharge at this time due to persistent symptomatology with ongoing work-up.  Patient will require at least 2 midnights for further evaluation and treatment of present condition.   Consults called: Cardiothoracic surgery by EDP     Objective: Vitals:   12/25/18 2312 12/26/18 0344 12/26/18 0723 12/26/18 0943  BP: (!) 155/82 (!) 159/103 (!) 143/106 (!) 135/109  Pulse: (!) 106 90 95 (!) 110  Resp: 16 18 18  Temp: 98.3 F (36.8 C) 98 F (36.7 C) (!) 97.5 F (36.4 C) 98 F (36.7 C)  TempSrc: Oral Oral Oral Oral  SpO2: 97% 99% 100% 100%  Weight:      Height:        Intake/Output Summary (Last 24 hours) at 12/26/2018 1235 Last data filed at 12/26/2018 1015  Gross per 24 hour  Intake 2310.04 ml  Output 950 ml  Net 1360.04 ml   Filed Weights   12/25/18 0850  Weight: 83.5 kg    Exam:  . General: 43 y.o. year-old male well developed well nourished in no acute distress.  Alert and oriented x4. . Cardiovascular: Regular rate and rhythm with no rubs or gallops.  No thyromegaly or JVD noted.  Mild edema involving left upper chest with tenderness on palpation. Marland Kitchen Respiratory: Clear to auscultation with no wheezes or rales. Good inspiratory effort. . Abdomen: Soft nontender nondistended with normal bowel sounds x4 quadrants. . Musculoskeletal: No lower extremity edema. 2/4 pulses in all 4 extremities. Marland Kitchen Psychiatry: Mood is appropriate for condition and setting   Data Reviewed: CBC: Recent Labs  Lab 12/25/18 0924 12/25/18 1515 12/26/18 0357  WBC 15.4* 13.8* 14.2*  NEUTROABS 10.8*  --   --   HGB 13.0 14.4 13.5  HCT 39.0 44.0 41.8  MCV 81.9 83.0 82.0  PLT 364 356 364   Basic Metabolic Panel: Recent Labs  Lab 12/25/18 0924 12/25/18 1515 12/26/18 0357  NA 135  --  138  K 3.1*  --  4.1  CL 98  --  103  CO2 24  --  25  GLUCOSE 154*  --  108*  BUN 12  --  6  CREATININE 0.51* 0.64 0.69  CALCIUM 9.0  --  8.7*   GFR: Estimated Creatinine Clearance: 137.7 mL/min (by C-G formula based on SCr of 0.69 mg/dL). Liver Function Tests: Recent Labs  Lab 12/25/18 0924  AST 13*  ALT 14  ALKPHOS 72  BILITOT 0.1*  PROT 7.1  ALBUMIN 3.0*   No results for input(s): LIPASE, AMYLASE in the last 168 hours. No results for input(s): AMMONIA in the last 168 hours. Coagulation Profile: No results for input(s): INR, PROTIME in the last 168 hours. Cardiac Enzymes: No results for input(s): CKTOTAL, CKMB, CKMBINDEX, TROPONINI in the last 168 hours. BNP (last 3 results) No results for input(s): PROBNP in the last 8760 hours. HbA1C: No results for input(s): HGBA1C in the last 72 hours. CBG: Recent Labs  Lab 12/25/18 1950  GLUCAP 135*   Lipid  Profile: No results for input(s): CHOL, HDL, LDLCALC, TRIG, CHOLHDL, LDLDIRECT in the last 72 hours. Thyroid Function Tests: No results for input(s): TSH, T4TOTAL, FREET4, T3FREE, THYROIDAB in the last 72 hours. Anemia Panel: No results for input(s): VITAMINB12, FOLATE, FERRITIN, TIBC, IRON, RETICCTPCT in the last 72 hours. Urine analysis:    Component Value Date/Time   COLORURINE YELLOW 12/25/2018 1548   APPEARANCEUR CLEAR 12/25/2018 1548   LABSPEC 1.021 12/25/2018 1548   PHURINE 7.0 12/25/2018 1548   GLUCOSEU NEGATIVE 12/25/2018 1548   HGBUR NEGATIVE 12/25/2018 1548   BILIRUBINUR NEGATIVE 12/25/2018 1548   KETONESUR NEGATIVE 12/25/2018 1548   PROTEINUR NEGATIVE 12/25/2018 1548   NITRITE NEGATIVE 12/25/2018 1548   LEUKOCYTESUR NEGATIVE 12/25/2018 1548   Sepsis Labs: @LABRCNTIP (procalcitonin:4,lacticidven:4)  ) Recent Results (from the past 240 hour(s))  SARS CORONAVIRUS 2 (TAT 6-24 HRS) Nasopharyngeal Nasopharyngeal Swab     Status: None   Collection Time: 12/25/18  9:06 AM  Specimen: Nasopharyngeal Swab  Result Value Ref Range Status   SARS Coronavirus 2 NEGATIVE NEGATIVE Final    Comment: (NOTE) SARS-CoV-2 target nucleic acids are NOT DETECTED. The SARS-CoV-2 RNA is generally detectable in upper and lower respiratory specimens during the acute phase of infection. Negative results do not preclude SARS-CoV-2 infection, do not rule out co-infections with other pathogens, and should not be used as the sole basis for treatment or other patient management decisions. Negative results must be combined with clinical observations, patient history, and epidemiological information. The expected result is Negative. Fact Sheet for Patients: HairSlick.nohttps://www.fda.gov/media/138098/download Fact Sheet for Healthcare Providers: quierodirigir.comhttps://www.fda.gov/media/138095/download This test is not yet approved or cleared by the [place] FDA and  has been authorized for detection and/or diagnosis  of SARS-CoV-2 by FDA under an Emergency Use Authorization (EUA). This EUA will remain  in effect (meaning this test can be used) for the duration of the COVID-19 declaration under Section 56 4(b)(1) of the Act, 21 U.S.C. section 360bbb-3(b)(1), unless the authorization is terminated or revoked sooner. Performed at [facility], [address] [place], [place] [address]   Culture, blood (routine x 2)     Status: None (Preliminary result)   Collection Time: 12/25/18  9:28 AM   Specimen: BLOOD  Result Value Ref Range Status   Specimen Description BLOOD RIGHT ANTECUBITAL  Final   Special Requests   Final    BOTTLES DRAWN AEROBIC AND ANAEROBIC Blood Culture results may not be optimal due to an excessive volume of blood received in culture bottles   Culture   Final    NO GROWTH < 24 HOURS Performed at [facility], [address] [place], [place] [address]    Report Status PENDING  Incomplete  Culture, blood (routine x 2)     Status: None (Preliminary result)   Collection Time: 12/25/18  9:28 AM   Specimen: BLOOD RIGHT HAND  Result Value Ref Range Status   Specimen Description BLOOD RIGHT HAND  Final   Special Requests   Final    BOTTLES DRAWN AEROBIC AND ANAEROBIC Blood Culture adequate volume   Culture   Final    NO GROWTH < 24 HOURS Performed at [facility], [address] [place], [place] [address]    Report Status PENDING  Incomplete      Studies: Mr Chest W Wo Contrast  Result Date: 12/26/2018 CLINICAL DATA:  IV drug abuser with acute onset chest and back pain 3-4 days ago. EXAM: MRI CHEST WITHOUT AND WITH CONTRAST TECHNIQUE: Multiplanar, multisequence MR imaging of the chest was performed before and after IV contrast administration. CONTRAST:  8 mL GADAVIST IV COMPARISON:  CT chest 12/25/2018. FINDINGS: Motion degrades the study. Marrow edema and enhancement are seen in the medial 3 cm of the manubrium of the sternum extending approximately 4.5 cm  craniocaudal. Bony destructive change on CT is not visible on this exam. Marrow edema and enhancement are also identified in the medial 3.5 cm of the left clavicle. Although difficult to see due to motion, there is abnormal signal in the anterior arc of the left first rib consistent with osteomyelitis and likely abnormal signal in the medial 3-4 cm of the left second rib consistent with osteomyelitis. Extensive soft tissue edema and enhancement are seen about the left sternoclavicular joint and costochondral junctions of the left first and second ribs. No abscess is identified. There is edema and enhancement in the medial 11 cm of the left pectoral  musculature consistent with myositis but no intramuscular fluid collection. There is cellulitis in the anterior left chest wall and left axilla. Small left pleural effusion is seen. Supraclavicular lymphadenopathy on the left is most consistent with reactive change. No evidence of discitis or osteomyelitis in the visualized cervical or thoracic spine is identified. 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 No abscess. Myositis of the left pectoral musculature without intramuscular abscess. Cellulitis over the anterior chest wall is worse on the left extends into the left axilla. Small left pleural effusion. Electronically Signed   By: Drusilla Kanner M.D.   On: 12/26/2018 10:25    Scheduled Meds: . enoxaparin (LOVENOX) injection  40 mg Subcutaneous Q24H  . senna-docusate  2 tablet Oral BID  . sodium chloride flush  3 mL Intravenous Once    Continuous Infusions: . 0.9 % NaCl with KCl 20 mEq / L 125 mL/hr at 12/26/18 0401  . piperacillin-tazobactam (ZOSYN)  IV    . vancomycin       LOS: 1 day     Darlin Drop, MD [facility] Pager [phone]  If 7PM-7AM, please contact night-coverage  [email or web address] Password [facility] 12/26/2018, 12:35 PM

## 2018-12-26 NOTE — Consult Note (Signed)
Chief Complaint: Patient was seen in consultation today for left first costosternal joint aspiration.  Referring Physician(s): Dr. Doristine Bosworth  Supervising Physician: Jacqulynn Cadet  Patient Status: [facility] - In-pt  History of Present Illness: Logan Fisher is a 43 y.o. male with a past medical history significant for HTN,HLD and IVDU who presented to [facility] ED yesterday with complaints of suddenly worsened chest and back pain. He began to have pain in his chest and back approximately 4 days prior associated with swelling. He was previously seen in urgent care for the same pain and given oral prednisone without relief. Initial workup significant for leukocytosis (15.4), hypokalemia (3.1), CRP 12.3 and sed rate 55. CT chest w/contrast showed abnormal ill-defined soft tissue density is noted anterior and posterior to the left first costosternal joint with possible lytic destruction involving the manubrial surface of the joint suggesting septic arthritis. A follow up MRI chest with and without contrast showed findings consistent with osteomyelitis in the manubrium of the sternum, medial left clavicle and anterior left first and second ribs with extensive surrounding inflammatory change; no abscess, myositis of the left pectoral musculature without intramuscular abscess, cellulitis over the anterior chest wall is worse on the left and extends into the left axilla, small left pleural effusion. IR has been consulted for a possible biopsy of the first costosternal joint due to concern for septic arthritis.  Logan Fisher is seen in his room today, he is sitting up in bed eating lunch and watching TV. He states his pain is still present but is somewhat better with pain medications. He reports the pain began in left trapezius muscle and then moved to his left chest near his clavicle. He reports swelling to his left chest as well. He is unsure if he has had any fevers at home. He states understanding of the requested  procedure and is agreeable to proceed.   Past Medical History:  Diagnosis Date   AC (acromioclavicular) joint bone spurs    Cellulitis    Drug abuse (Greasy)    Environmental allergies    Hiatal hernia    Hypercholesteremia    Hypertension     Past Surgical History:  Procedure Laterality Date   HERNIA REPAIR      Allergies: Patient has no known allergies.  Medications: Prior to Admission medications   Medication Sig Start Date End Date Taking? Authorizing Provider  cyclobenzaprine (FLEXERIL) 10 MG tablet Take 10 mg by mouth at bedtime as needed for muscle spasms. 12/23/18 01/02/19 Yes [provider]  predniSONE (DELTASONE) 20 MG tablet Take 40 mg by mouth daily. For 3-5 days   Yes [provider]     No family history on file.  Social History   Socioeconomic History   Marital status: Married    Spouse name: Not on file   Number of children: Not on file   Years of education: Not on file   Highest education level: Not on file  Occupational History   Not on file  Social Needs   Financial resource strain: Not on file   Food insecurity    Worry: Not on file    Inability: Not on file   Transportation needs    Medical: Not on file    Non-medical: Not on file  Tobacco Use   Smoking status: Never Smoker   Smokeless tobacco: Never Used  Substance and Sexual Activity   Alcohol use: Yes    Comment: weekly   Drug use: Yes  Types: Methamphetamines, IV, Cocaine    Comment: heroin and meth    Sexual activity: Not on file  Lifestyle   Physical activity    Days per week: Not on file    Minutes per session: Not on file   Stress: Not on file  Relationships   Social connections    Talks on phone: Not on file    Gets together: Not on file    Attends religious service: Not on file    Active member of club or organization: Not on file    Attends meetings of clubs or organizations: Not on file    Relationship status: Not on file    Other Topics Concern   Not on file  Social History Narrative   Not on file     Review of Systems: A 12 point ROS discussed and pertinent positives are indicated in the HPI above.  All other systems are negative.  Review of Systems  Constitutional: Negative for chills and fever.  Respiratory: Negative for cough and shortness of breath.   Cardiovascular: Positive for chest pain (left chest wall pain near clavicle).  Gastrointestinal: Negative for abdominal pain, nausea and vomiting.  Musculoskeletal: Positive for back pain.  Skin: Negative for rash and wound.       (+) swelling of skin over left chest  Neurological: Negative for dizziness and headaches.    Vital Signs: BP (!) 131/94 (BP Location: Right Arm)    Pulse (!) 105    Temp 97.9 F (36.6 C) (Oral)    Resp 18    Ht 6\' 5"  (1.956 m)    Wt 184 lb (83.5 kg)    SpO2 99%    BMI 21.82 kg/m   Physical Exam Vitals signs reviewed.  Constitutional:      General: He is not in acute distress. HENT:     Head: Normocephalic.     Mouth/Throat:     Mouth: Mucous membranes are moist.     Pharynx: Oropharynx is clear. No oropharyngeal exudate or posterior oropharyngeal erythema.     Comments: Poor dentition Cardiovascular:     Rate and Rhythm: Tachycardia present.     Comments: Pain to minimal palpation of left chest from sternum to shoulder; no obvious swelling or erythema on my exam today. No rashes or open wounds. Pulmonary:     Effort: Pulmonary effort is normal.     Breath sounds: Normal breath sounds.  Abdominal:     General: Bowel sounds are normal. There is no distension.     Palpations: Abdomen is soft.     Tenderness: There is no abdominal tenderness.  Skin:    General: Skin is warm and dry.     Comments: Long scars on bilateral biceps  Neurological:     Mental Status: He is alert and oriented to person, place, and time.  Psychiatric:        Mood and Affect: Mood normal.        Behavior: Behavior normal.         Thought Content: Thought content normal.        Judgment: Judgment normal.      MD Evaluation Airway: WNL Heart: WNL Abdomen: WNL Chest/ Lungs: WNL ASA  Classification: 2 Mallampati/Airway Score: Two   Imaging: Ct Chest W Contrast  Result Date: 12/25/2018 CLINICAL DATA:  Chest pain. EXAM: CT CHEST WITH CONTRAST TECHNIQUE: Multidetector CT imaging of the chest was performed during intravenous contrast administration. CONTRAST:  75mL OMNIPAQUE IOHEXOL 350 MG/ML  SOLN COMPARISON:  Same day. FINDINGS: Cardiovascular: No significant vascular findings. Normal heart size. No pericardial effusion. Mediastinum/Nodes: Mildly enlarged left supraclavicular lymph nodes are noted with the largest measuring 12 mm. These most likely are inflammatory in etiology. Thyroid gland, trachea, and esophagus demonstrate no significant findings. Lungs/Pleura: No pneumothorax or pleural effusion is noted. Minimal bilateral posterior basilar subsegmental atelectasis is noted. Upper Abdomen: No acute abnormality. Musculoskeletal: There is noted abnormal ill-defined soft tissue density anterior and posterior to the left first costosternal joint. This is concerning for septic arthritis with surrounding inflammation. No definite fluid collection or abscess is noted. There does appear to be some lytic destruction involving the manubrial surface of the joint suggesting osteomyelitis. IMPRESSION: Abnormal ill-defined soft tissue density is noted anterior and posterior to the left first costosternal joint with possible lytic destruction involving the manubrial surface of the joint, suggesting septic arthritis. MRI is recommended for further evaluation. Mildly enlarged left supraclavicular lymph nodes are noted which most likely are inflammatory in etiology given the above findings. Minimal bilateral posterior basilar subsegmental atelectasis. Electronically Signed   By: Lupita Raider M.D.   On: 12/25/2018 11:54   Mr Chest W Wo  Contrast  Result Date: 12/26/2018 CLINICAL DATA:  IV drug abuser with acute onset chest and back pain 3-4 days ago. EXAM: MRI CHEST WITHOUT AND WITH CONTRAST TECHNIQUE: Multiplanar, multisequence MR imaging of the chest was performed before and after IV contrast administration. CONTRAST:  8 mL GADAVIST IV COMPARISON:  CT chest 12/25/2018. FINDINGS: Motion degrades the study. Marrow edema and enhancement are seen in the medial 3 cm of the manubrium of the sternum extending approximately 4.5 cm craniocaudal. Bony destructive change on CT is not visible on this exam. Marrow edema and enhancement are also identified in the medial 3.5 cm of the left clavicle. Although difficult to see due to motion, there is abnormal signal in the anterior arc of the left first rib consistent with osteomyelitis and likely abnormal signal in the medial 3-4 cm of the left second rib consistent with osteomyelitis. Extensive soft tissue edema and enhancement are seen about the left sternoclavicular joint and costochondral junctions of the left first and second ribs. No abscess is identified. There is edema and enhancement in the medial 11 cm of the left pectoral musculature consistent with myositis but no intramuscular fluid collection. There is cellulitis in the anterior left chest wall and left axilla. Small left pleural effusion is seen. Supraclavicular lymphadenopathy on the left is most consistent with reactive change. No evidence of discitis or osteomyelitis in the visualized cervical or thoracic spine is identified. 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 No abscess. Myositis of the left pectoral musculature without intramuscular abscess. Cellulitis over the anterior chest wall is worse on the left extends into the left axilla. Small left pleural effusion. Electronically Signed    By: Drusilla Kanner M.D.   On: 12/26/2018 10:25   Dg Chest Portable 1 View  Result Date: 12/25/2018 CLINICAL DATA:  43 year old male with pain and swelling in the left shoulder and left collarbone. EXAM: PORTABLE CHEST 1 VIEW COMPARISON:  Chest radiograph dated 12/28/2017 FINDINGS: There is an area of opacity in the left upper lobe medially and abutting the mediastinum which may represent atelectasis or infiltrate. A mass, fluid collection, or hematoma is not excluded. There is slight shift of the trachea to the right  of the midline. This can be better evaluated with CT if clinically indicated. There is mild eventration of the left hemidiaphragm. The right lung is clear. There is no pleural effusion or pneumothorax. The cardiac silhouette is within normal limits. No acute osseous pathology. IMPRESSION: Ill-defined area of opacity in the left upper lobe medially may represent atelectasis or infiltrate. A mass, fluid collection, or hematoma is not excluded. CT may provide better evaluation if clinically indicated. Electronically Signed   By: Elgie Collard M.D.   On: 12/25/2018 09:19    Labs:  CBC: Recent Labs    11/25/18 1743 12/25/18 0924 12/25/18 1515 12/26/18 0357  WBC 8.0 15.4* 13.8* 14.2*  HGB 13.9 13.0 14.4 13.5  HCT 40.6 39.0 44.0 41.8  PLT 346 364 356 364    COAGS: No results for input(s): INR, APTT in the last 8760 hours.  BMP: Recent Labs    11/07/18 0400 11/25/18 1743 12/25/18 0924 12/25/18 1515 12/26/18 0357  NA 139 138 135  --  138  K 3.5 3.8 3.1*  --  4.1  CL 103 102 98  --  103  CO2 24 29 24   --  25  GLUCOSE 114* 88 154*  --  108*  BUN 12 7 12   --  6  CALCIUM 9.0 8.9 9.0  --  8.7*  CREATININE 0.89 0.67 0.51* 0.64 0.69  GFRNONAA >60 >60 >60 >60 >60  GFRAA >60 >60 >60 >60 >60    LIVER FUNCTION TESTS: Recent Labs    03/19/18 2033 11/07/18 0400 12/25/18 0924  BILITOT 0.7 0.6 0.1*  AST 25 15 13*  ALT 22 14 14   ALKPHOS 78 71 72  PROT 7.4 7.3 7.1    ALBUMIN 3.6 3.8 3.0*    TUMOR MARKERS: No results for input(s): AFPTM, CEA, CA199, CHROMGRNA in the last 8760 hours.  Assessment and Plan:  43 y/o M with history of IVDU who presented to [facility] ED yesterday with complaints of left sided upper back and chest pain. Imaging concerning for possible septic arthritis of the first costosternal joint - IR has been consulted for possibly aspiration to further guide management. Imaging has been reviewed by Dr. who agrees to attempt procedure however given the location this may be not be possible.  Plan for CT guided aspiration tomorrow (10/23) in IR with moderate sedation. Patient to be NPO after midnight, no heparin/lovenox until post procedure, AM labs ordered, IR will call for patient when ready.   Risks and benefits of costosternal joint aspiration was discussed with the patient and/or patient's family including, but not limited to bleeding, infection, damage to adjacent structures or low yield requiring additional tests.  All of the questions were answered and there is agreement to proceed.  Consent signed and in chart.  Thank you for this interesting consult.  I greatly enjoyed meeting Logan Fisher and look forward to participating in their care.  A copy of this report was sent to the requesting provider on this date.  Electronically Signed: Miles Costain, PA-C 12/26/2018, 1:56 PM   I spent a total of 20 Minutes  in face to face in clinical consultation, greater than 50% of which was counseling/coordinating care for costosternal joint aspiration.

## 2018-12-26 NOTE — Progress Notes (Signed)
Pharmacy Antibiotic Note  Logan Fisher is a 43 y.o. male admitted on 12/25/2018 with CP, now with concern for sepsis.  Hx of IV drug use, septic arthritis.  Pharmacy has been consulted for vancomycin and Zosyn dosing.  Plan: Vancomycin 2000mg  IV x1, then 1000mg  IV every 8 hours Goal AUC 400-550. Expected AUC: 420 SCr used: 0.8 Zosyn 3.375g IV every 8 hours (extended infusion) Monitor renal function, Cx and clinical progression to narrow Vancomycin levels as needed   Height: 6\' 5"  (195.6 cm) Weight: 184 lb (83.5 kg) IBW/kg (Calculated) : 89.1  Temp (24hrs), Avg:98.5 F (36.9 C), Min:98 F (36.7 C), Max:98.8 F (37.1 C)  Recent Labs  Lab 12/25/18 0924 12/25/18 1515 12/26/18 0357  WBC 15.4* 13.8* 14.2*  CREATININE 0.51* 0.64 0.69    Estimated Creatinine Clearance: 137.7 mL/min (by C-G formula based on SCr of 0.69 mg/dL).    No Known Allergies  Bertis Ruddy, PharmD Clinical Pharmacist Please check AMION for all [place] numbers 12/26/2018 7:49 AM

## 2018-12-26 NOTE — Plan of Care (Signed)
Patient is progressing towards plan of care goals. 

## 2018-12-27 ENCOUNTER — Inpatient Hospital Stay (HOSPITAL_COMMUNITY): Payer: Self-pay

## 2018-12-27 LAB — CBC
HCT: 38.9 % — ABNORMAL LOW (ref 39.0–52.0)
Hemoglobin: 12.9 g/dL — ABNORMAL LOW (ref 13.0–17.0)
MCH: 27.3 pg (ref 26.0–34.0)
MCHC: 33.2 g/dL (ref 30.0–36.0)
MCV: 82.4 fL (ref 80.0–100.0)
Platelets: 400 10*3/uL (ref 150–400)
RBC: 4.72 MIL/uL (ref 4.22–5.81)
RDW: 12.9 % (ref 11.5–15.5)
WBC: 12.6 10*3/uL — ABNORMAL HIGH (ref 4.0–10.5)
nRBC: 0 % (ref 0.0–0.2)

## 2018-12-27 LAB — MRSA PCR SCREENING: MRSA by PCR: NEGATIVE

## 2018-12-27 LAB — PROTIME-INR
INR: 1 (ref 0.8–1.2)
Prothrombin Time: 13.5 seconds (ref 11.4–15.2)

## 2018-12-27 MED ORDER — FENTANYL CITRATE (PF) 100 MCG/2ML IJ SOLN
INTRAMUSCULAR | Status: AC
  Filled 2018-12-27: qty 2

## 2018-12-27 MED ORDER — FENTANYL CITRATE (PF) 100 MCG/2ML IJ SOLN
INTRAMUSCULAR | Status: AC | PRN
  Administered 2018-12-27 (×2): 50 ug via INTRAVENOUS

## 2018-12-27 MED ORDER — HYDROMORPHONE HCL 1 MG/ML IJ SOLN
1.0000 mg | INTRAMUSCULAR | Status: DC | PRN
  Administered 2018-12-27 (×2): 1 mg via INTRAVENOUS
  Filled 2018-12-27 (×2): qty 1

## 2018-12-27 MED ORDER — MIDAZOLAM HCL 2 MG/2ML IJ SOLN
INTRAMUSCULAR | Status: AC | PRN
  Administered 2018-12-27: 0.5 mg via INTRAVENOUS
  Administered 2018-12-27: 1 mg via INTRAVENOUS

## 2018-12-27 MED ORDER — MIDAZOLAM HCL 2 MG/2ML IJ SOLN
INTRAMUSCULAR | Status: AC
  Filled 2018-12-27: qty 2

## 2018-12-27 MED ORDER — OXYCODONE HCL 5 MG PO TABS
10.0000 mg | ORAL_TABLET | Freq: Four times a day (QID) | ORAL | Status: DC | PRN
  Administered 2018-12-27: 10 mg via ORAL
  Filled 2018-12-27: qty 2

## 2018-12-27 MED ORDER — LIDOCAINE-EPINEPHRINE 1 %-1:100000 IJ SOLN
INTRAMUSCULAR | Status: AC
  Filled 2018-12-27: qty 1

## 2018-12-27 NOTE — Progress Notes (Signed)
Patient seen leaving unit with belongings and visitor. Nurse followed patient and visitor to main entrance of hospital. Nurse advised patient not to leave, patient refused to stay. Patient Alert and Oriented x4 at this time. Patient refused to sign AMA form. Nurse removed IV. MD paged. Gwendolyn Grant, RN

## 2018-12-27 NOTE — Progress Notes (Signed)
PROGRESS NOTE  Logan Fisher DOB: 10-Jun-1975 DOA: 12/25/2018 PCP: Roger Kill, PA-C  HPI/Recap of past 24 hours: : Logan Fisher is a 43 y.o. male with medical history significant of IV drug abuse presents to emergency department due to sudden onset of central chest pain and back pain since 3 to 4 days.  Patient reports worsening of symptoms this morning and associated with swelling of anterior chest.  He was seen at an urgent care recently and was given oral prednisone.  No history of fever, chills, nausea, vomiting, numbness weakness tingling sensation in upper or lower extremities.  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  No history of endocarditis.  Upon my evaluation: Patient received IV Ativan in the ED.  Patient was very sleepy-arousable  But goes back to sleep.  ED Course: Upon arrival: Patient was tachycardic, elevated blood pressure, troponin: WNL x2, sed rate 55, CRP: 12.3.  CBC shows leukocytosis of 15.4.  CMP: Shows potassium of 3.1.  Blood culture was obtained and is pending.  EDP consulted cardiothoracic surgery for possible joint drainage.  Review of Systems: As per HPI otherwise negative.   12/27/18: Patient was seen and examined at his bedside this morning.  Reports significant pain at his left upper chest at level of First costosternal joint.  Pain Management adjusted.  Plan for IR aspirate on 12/27/18.   Assessment/Plan: Active Problems:   Septic arthritis (HCC)   IV drug abuse (HCC)   Hypokalemia  Resolving sepsis secondary to presumed left first costosternal joint septic arthritis Presented with leukocytosis, tachycardia and abnormal finding on CT chest with contrast CT chest done on admission showed Abnormal ill-defined soft tissue density is noted anterior and posterior to the left first costosternal joint with possible lytic destruction involving the manubrial surface of the joint, suggesting septic arthritis. WBC  trending down C/w IV abx MRSA screening (-) Blood cultures x2 peripherally no growth x2 days. IR consult for possible joint aspiration, completed on 12/27/18.  Presumed left first costosternal joint septic arthritis Management as per above Pain management in place with IV Dilaudid as needed for severe pain Oxycodone as needed for moderate pain. Bowel regimen in place to avoid opiate-induced constipation  IV drug abuse with heroin and methamphetamine UDS positive for above on 12/25/2018 States he has been using for a year less than 1/4 g/day Receptive to sobriety and starting methadone if no surgery planned Polysubstance abuse cessation counseling done at bedside Obtain HIV screening and acute hepatitis screening  Transient elevated blood pressure Possibly related to pain Not on antihypertensives at home Home IV morphine 2 mg every 3 hours as needed for severe pain  Chronic left shoulder pain post injury in 2019 Symptomatic management  Transient tachycardia Asymptomatic Continue to monitor    DVT prophylaxis: TED/SCD/Lovenox subcu daily code Status: Full code  family Communication: None present at bedside.  Plan of care discussed with patient in length and he verbalized understanding and agreed with it.   Disposition Plan:  Patient is currently not appropriate for discharge at this time due to persistent symptomatology with ongoing work-up.  Patient will require at least 2 midnights for further evaluation and treatment of present condition.   Consults called: Cardiothoracic surgery by EDP     Objective: Vitals:   12/27/18 0939 12/27/18 0956 12/27/18 1000 12/27/18 1004  BP: (!) 147/98 140/78 (!) 137/98 (!) 137/98  Pulse: (!) 104 92 96 96  Resp: 13  12 10  Temp:      TempSrc:      SpO2: 99% 100% 99% 99%  Weight:      Height:        Intake/Output Summary (Last 24 hours) at 12/27/2018 1223 Last data filed at 12/27/2018 0700 Gross per 24 hour  Intake 2186.94 ml   Output -  Net 2186.94 ml   Filed Weights   12/25/18 0850  Weight: 83.5 kg    Exam:  . General: 43 y.o. year-old male well-developed well-nourished no acute distress.  Alert and oriented x4.   . Cardiovascular: Regular rhythm without murmur rubs or gallops.  Respiratory: Clear to auscultation no wheezes or rales.   Marland Kitchen \Abdomen: Soft nontender nondistended normal bowel sounds present.   . Musculoskeletal: No lower extremity edema.  2/4 pulses in all 4 extremities.   Marland Kitchen Psychiatry: Mood is appropriate for condition and setting.   Data Reviewed: CBC: Recent Labs  Lab 12/25/18 0924 12/25/18 1515 12/26/18 0357 12/27/18 0242  WBC 15.4* 13.8* 14.2* 12.6*  NEUTROABS 10.8*  --   --   --   HGB 13.0 14.4 13.5 12.9*  HCT 39.0 44.0 41.8 38.9*  MCV 81.9 83.0 82.0 82.4  PLT 364 356 364 400   Basic Metabolic Panel: Recent Labs  Lab 12/25/18 0924 12/25/18 1515 12/26/18 0357  NA 135  --  138  K 3.1*  --  4.1  CL 98  --  103  CO2 24  --  25  GLUCOSE 154*  --  108*  BUN 12  --  6  CREATININE 0.51* 0.64 0.69  CALCIUM 9.0  --  8.7*   GFR: Estimated Creatinine Clearance: 137.7 mL/min (by C-G formula based on SCr of 0.69 mg/dL). Liver Function Tests: Recent Labs  Lab 12/25/18 0924  AST 13*  ALT 14  ALKPHOS 72  BILITOT 0.1*  PROT 7.1  ALBUMIN 3.0*   No results for input(s): LIPASE, AMYLASE in the last 168 hours. No results for input(s): AMMONIA in the last 168 hours. Coagulation Profile: Recent Labs  Lab 12/27/18 0242  INR 1.0   Cardiac Enzymes: No results for input(s): CKTOTAL, CKMB, CKMBINDEX, TROPONINI in the last 168 hours. BNP (last 3 results) No results for input(s): PROBNP in the last 8760 hours. HbA1C: No results for input(s): HGBA1C in the last 72 hours. CBG: Recent Labs  Lab 12/25/18 1950  GLUCAP 135*   Lipid Profile: No results for input(s): CHOL, HDL, LDLCALC, TRIG, CHOLHDL, LDLDIRECT in the last 72 hours. Thyroid Function Tests: No results for  input(s): TSH, T4TOTAL, FREET4, T3FREE, THYROIDAB in the last 72 hours. Anemia Panel: No results for input(s): VITAMINB12, FOLATE, FERRITIN, TIBC, IRON, RETICCTPCT in the last 72 hours. Urine analysis:    Component Value Date/Time   COLORURINE YELLOW 12/25/2018 1548   APPEARANCEUR CLEAR 12/25/2018 1548   LABSPEC 1.021 12/25/2018 1548   PHURINE 7.0 12/25/2018 1548   GLUCOSEU NEGATIVE 12/25/2018 1548   HGBUR NEGATIVE 12/25/2018 1548   BILIRUBINUR NEGATIVE 12/25/2018 1548   KETONESUR NEGATIVE 12/25/2018 1548   PROTEINUR NEGATIVE 12/25/2018 1548   NITRITE NEGATIVE 12/25/2018 1548   LEUKOCYTESUR NEGATIVE 12/25/2018 1548   Sepsis Labs: (procalcitonin:4,lacticidven:4)  ) Recent Results (from the past 240 hour(s))  SARS CORONAVIRUS 2 (TAT 6-24 HRS) Nasopharyngeal Nasopharyngeal Swab     Status: None   Collection Time: 12/25/18  9:06 AM   Specimen: Nasopharyngeal Swab  Result Value Ref Range Status   SARS Coronavirus 2 NEGATIVE NEGATIVE Final    Comment: (NOTE)  SARS-CoV-2 target nucleic acids are NOT DETECTED. The SARS-CoV-2 RNA is generally detectable in upper and lower respiratory specimens during the acute phase of infection. Negative results do not preclude SARS-CoV-2 infection, do not rule out co-infections with other pathogens, and should not be used as the sole basis for treatment or other patient management decisions. Negative results must be combined with clinical observations, patient history, and epidemiological information. The expected result is Negative. Fact Sheet for Patients: HairSlick.nohttps://www.fda.gov/media/138098/download Fact Sheet for Healthcare Providers: quierodirigir.comhttps://www.fda.gov/media/138095/download This test is not yet approved or cleared by the [place] FDA and  has been authorized for detection and/or diagnosis of SARS-CoV-2 by FDA under an Emergency Use Authorization (EUA). This EUA will remain  in effect (meaning this test can be used) for the  duration of the COVID-19 declaration under Section 56 4(b)(1) of the Act, 21 U.S.C. section 360bbb-3(b)(1), unless the authorization is terminated or revoked sooner. Performed at [facility], [address] [place], [place] [address]   Culture, blood (routine x 2)     Status: None (Preliminary result)   Collection Time: 12/25/18  9:28 AM   Specimen: BLOOD  Result Value Ref Range Status   Specimen Description BLOOD RIGHT ANTECUBITAL  Final   Special Requests   Final    BOTTLES DRAWN AEROBIC AND ANAEROBIC Blood Culture results may not be optimal due to an excessive volume of blood received in culture bottles   Culture   Final    NO GROWTH 2 DAYS Performed at [facility], [address] [place], [place] [address]    Report Status PENDING  Incomplete  Culture, blood (routine x 2)     Status: None (Preliminary result)   Collection Time: 12/25/18  9:28 AM   Specimen: BLOOD RIGHT HAND  Result Value Ref Range Status   Specimen Description BLOOD RIGHT HAND  Final   Special Requests   Final    BOTTLES DRAWN AEROBIC AND ANAEROBIC Blood Culture adequate volume   Culture   Final    NO GROWTH 2 DAYS Performed at [facility], [address] [place], [place] [address]    Report Status PENDING  Incomplete  MRSA PCR Screening     Status: None   Collection Time: 12/27/18  8:47 AM   Specimen: Nasal Mucosa; Nasopharyngeal  Result Value Ref Range Status   MRSA by PCR NEGATIVE NEGATIVE Final    Comment:        The GeneXpert MRSA Assay (FDA approved for NASAL specimens only), is one component of a comprehensive MRSA colonization surveillance program. It is not intended to diagnose MRSA infection nor to guide or monitor treatment for MRSA infections. Performed at [facility], [address] [place], [place] [address]       Studies: No results found.  Scheduled Meds: . [START ON 12/28/2018] enoxaparin (LOVENOX) injection  40 mg Subcutaneous Q24H  .  fentaNYL      . influenza vac split quadrivalent PF  0.5 mL Intramuscular Tomorrow-1000  . lidocaine-EPINEPHrine      . midazolam      . senna-docusate  2 tablet Oral BID  . sodium chloride flush  3 mL Intravenous Once    Continuous Infusions: . 0.9 % NaCl with KCl 20 mEq / L 75 mL/hr at 12/27/18 0850  . piperacillin-tazobactam (ZOSYN)  IV 12.5 mL/hr at 12/27/18 0700  . vancomycin 1,000 mg (12/27/18 1035)     LOS: 2 days  Kayleen Memos, MD [facility] Pager [phone]  If 7PM-7AM, please contact night-coverage [email or web address] Password [facility] 12/27/2018, 12:23 PM

## 2018-12-27 NOTE — Progress Notes (Signed)
     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  There is no indication for surgical drainage at this point.  Recommend medical therapy.

## 2018-12-27 NOTE — Progress Notes (Signed)
Pt reports he was taking Subutec from Dr. Brayton El for about a month. He was unable to get to [facility] to get a refill but he says he has a prescription somewhere. He reported he last used herione on Wed 10/21st. We discussed the recent episode of him attempting to use here in his room yesterday. He stated is was "the last of his wash". He acknowledged his addiction.Stated he and his girlfriend had plans to go to rehab next week.He reported his girlfriend "also uses.".  WE discussed the visitation policy. And limitation to ensure his safety, leaving the door open during visitation  We discussed involvement of the [facility] PD. He verbalized understanding about our concern for his safety as well as the safety of others and the staff. Girlfriends name is Information systems manager.

## 2018-12-27 NOTE — Discharge Summary (Addendum)
Discharge Summary  Logan REINECK WNI:[id number] DOB: [birth year]  PCP: Heywood Bene, PA-C  Admit date: 12/25/2018 Discharge date: 12/27/2018  Time spent: 15 minutes  Recommendations for Outpatient Follow-up:  1. Patient left AMA   Discharge Diagnoses:  Active Hospital Problems   Diagnosis Date Noted   Septic arthritis ([place]) 12/25/2018   IV drug abuse ([place]) 12/25/2018   Hypokalemia 12/25/2018    Resolved Hospital Problems  No resolved problems to display.     Vitals:   12/27/18 1235 12/27/18 1549  BP: (!) 143/96 (!) 162/96  Pulse: (!) 109 92  Resp: 19 17  Temp: 98.3 F (36.8 C) 97.7 F (36.5 C)  SpO2: 97% 100%    History of present illness:  Logan Fisher a 43 y.o.malewith medical history significant ofIV drug abuse presents to emergency department due to sudden onset of central chest pain and back pain since 3 to 4 days. Patient reports worsening of symptoms this morning and associated with swelling of anterior chest. He was seen at an urgent care recently and was given oral prednisone. 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 No history of endocarditis.  Upon my evaluation: Patient received IV Ativan in the ED. Patient was very sleepy-arousableBut goes back to sleep.  ED Course:Upon arrival: Patient was tachycardic, elevated blood pressure, troponin: WNL x2, sed rate 55, CRP: 12.3. CBC shows leukocytosis of 15.4. CMP: Shows potassium of 3.1. Blood culture was obtained and is pending. EDP consulted cardiothoracic surgery for possible jointdrainage.  Review of Systems: As per HPI otherwise negative.  12/27/18: Patient was seen and examined at his bedside this morning.  Reports significant pain at his left upper chest at level of First costosternal joint.  Pain Management adjusted.  Plan for IR aspirate  on 12/27/18.  Received a call from bedside RN. Patient left AMA.  Hospital Course:  Active Problems:   Septic arthritis ([place])   IV drug abuse ([place])   Hypokalemia  Resolving sepsis secondary to presumed left first costosternal joint septic arthritis Presented with leukocytosis, tachycardia and abnormal finding on CT chest with contrast CT chest done on admission showed Abnormal ill-defined soft tissue density is noted anterior and posterior to the left first costosternal joint with possible lytic destruction involving the manubrial surface of the joint, suggesting septic arthritis. WBC trending down C/w IV abx MRSA screening (-) Blood cultures x2 peripherally no growth x2 days. IR consult for possible joint aspiration, completed on 12/27/18.  Newly diagnosed Left sternal clavicular first rib osteomyelitis Findings per MRI No abscess-  CTS recs medical management C/w empiric IV antibiotics until ID and sensitivities result  Left first costosternal joint septic arthritis Management as per above Pain management in place with IV Dilaudid as needed for severe pain Oxycodone as needed for moderate pain. Bowel regimen in place to avoid opiate-induced constipation  IV drug abuse with heroin and methamphetamine UDS positive for above on 12/25/2018 States he has been using for a year less than 1/4 g/day Receptive to sobriety and starting methadone if no surgery planned Polysubstance abuse cessation counseling done at bedside Obtain HIV screening and acute hepatitis screening  Transient elevated blood pressure Possibly related to pain Not on antihypertensives at home Home IV morphine 2 mg every 3 hours as needed for severe pain  Chronic left shoulder pain post injury in 2019 Symptomatic management  Transient tachycardia Asymptomatic Continue to  monitor    DVT prophylaxis:TED/SCD/Lovenox subcu daily code Status:Full code  family Communication:None present at  bedside. Plan of care discussed with patient in length and he verbalized understanding and agreed with it.   Disposition Plan: Patient is currently not appropriate for discharge at this time due to persistent symptomatology with ongoing work-up.  Patient will require at least 2 midnights for further evaluation and treatment of present condition.   Consults called:Cardiothoracic surgery by EDP   Discharge Exam: BP (!) 162/96 (BP Location: Left Arm)    Pulse 92    Temp 97.7 F (36.5 C) (Oral)    Resp 17    Ht  (1.956 m)    Wt 83.5 kg    SpO2 100%    BMI 21.82 kg/m   General: 43 y.o. year-old male well developed well nourished in no acute distress.  Alert and oriented x4.  Cardiovascular: Regular rate and rhythm with no rubs or gallops.  No thyromegaly or JVD noted.    Respiratory: Clear to auscultation with no wheezes or rales. Good inspiratory effort.  Abdomen: Soft nontender nondistended with normal bowel sounds x4 quadrants.  Musculoskeletal: No lower extremity edema. 2/4 pulses in all 4 extremities.  Psychiatry: Mood is appropriate for condition and setting  Discharge Instructions You were cared for by a hospitalist during your hospital stay. If you have any questions about your discharge medications or the care you received while you were in the hospital after you are discharged, you can call the unit and asked to speak with the hospitalist on call if the hospitalist that took care of you is not available. Once you are discharged, your primary care physician will handle any further medical issues. Please note that NO REFILLS for any discharge medications will be authorized once you are discharged, as it is imperative that you return to your primary care physician (or establish a relationship with a primary care physician if you do not have one) for your aftercare needs so that they can reassess your need for medications and monitor your lab values.   Allergies as of  12/27/2018   No Known Allergies     Medication List    STOP taking these medications   cyclobenzaprine 10 MG tablet Commonly known as: FLEXERIL   predniSONE 20 MG tablet Commonly known as: DELTASONE      No Known Allergies    The results of significant diagnostics from this hospitalization (including imaging, microbiology, ancillary and laboratory) are listed below for reference.    Significant Diagnostic Studies: Ct Chest W Contrast  Result Date: 12/25/2018 CLINICAL DATA:  Chest pain. EXAM: CT CHEST WITH CONTRAST TECHNIQUE: Multidetector CT imaging of the chest was performed during intravenous contrast administration. CONTRAST:  75mL OMNIPAQUE IOHEXOL 350 MG/ML SOLN COMPARISON:  Same day. FINDINGS: Cardiovascular: No significant vascular findings. Normal heart size. No pericardial effusion. Mediastinum/Nodes: Mildly enlarged left supraclavicular lymph nodes are noted with the largest measuring 12 mm. These most likely are inflammatory in etiology. Thyroid gland, trachea, and esophagus demonstrate no significant findings. Lungs/Pleura: No pneumothorax or pleural effusion is noted. Minimal bilateral posterior basilar subsegmental atelectasis is noted. Upper Abdomen: No acute abnormality. Musculoskeletal: There is noted abnormal ill-defined soft tissue density anterior and posterior to the left first costosternal joint. This is concerning for septic arthritis with surrounding inflammation. No definite fluid collection or abscess is noted. There does appear to be some lytic destruction involving the manubrial surface of the joint suggesting osteomyelitis. IMPRESSION: Abnormal ill-defined soft tissue  density is noted anterior and posterior to the left first costosternal joint with possible lytic destruction involving the manubrial surface of the joint, suggesting septic arthritis. MRI is recommended for further evaluation. Mildly enlarged left supraclavicular lymph nodes are noted which most  likely are inflammatory in etiology given the above findings. Minimal bilateral posterior basilar subsegmental atelectasis. Electronically Signed   By: Lupita RaiderJames  Green Jr M.D.   On: 12/25/2018 11:54   Mr Chest W Wo Contrast  Result Date: 12/26/2018 CLINICAL DATA:  IV drug abuser with acute onset chest and back pain 3-4 days ago. EXAM: MRI CHEST WITHOUT AND WITH CONTRAST TECHNIQUE: Multiplanar, multisequence MR imaging of the chest was performed before and after IV contrast administration. CONTRAST:  8 mL GADAVIST IV COMPARISON:  CT chest 12/25/2018. FINDINGS: Motion degrades the study. Marrow edema and enhancement are seen in the medial 3 cm of the manubrium of the sternum extending approximately 4.5 cm craniocaudal. Bony destructive change on CT is not visible on this exam. Marrow edema and enhancement are also identified in the medial 3.5 cm of the left clavicle. Although difficult to see due to motion, there is abnormal signal in the anterior arc of the left first rib consistent with osteomyelitis and likely abnormal signal in the medial 3-4 cm of the left second rib consistent with osteomyelitis. Extensive soft tissue edema and enhancement are seen about the left sternoclavicular joint and costochondral junctions of the left first and second ribs. No abscess is identified. There is edema and enhancement in the medial 11 cm of the left pectoral musculature consistent with myositis but no intramuscular fluid collection. There is cellulitis in the anterior left chest wall and left axilla. Small left pleural effusion is seen. Supraclavicular lymphadenopathy on the left is most consistent with reactive change. No evidence of discitis or osteomyelitis in the visualized cervical or thoracic spine is identified. 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 No abscess.  Myositis of the left pectoral musculature without intramuscular abscess. Cellulitis over the anterior chest wall is worse on the left extends into the left axilla. Small left pleural effusion. Electronically Signed   By: Drusilla Kannerhomas  Dalessio M.D.   On: 12/26/2018 10:25   Ct Aspiration  Result Date: 12/27/2018 INDICATION: Concern for osteomyelitis involving the manubrium, sternum, medial aspect of the left clavicle as well as the anterior aspects of the left first and second ribs. Please perform CT-guided aspiration of the left sternoclavicular joint for diagnostic purposes. EXAM: CT GUIDANCE NEEDLE PLACEMENT COMPARISON:  Chest MRI-12/26/2018; chest CT-12/25/2018 MEDICATIONS: The patient is currently admitted to the hospital and receiving intravenous antibiotics. The antibiotics were administered within an appropriate time frame prior to the initiation of the procedure. ANESTHESIA/SEDATION: Moderate (conscious) sedation was employed during this procedure. A total of Versed 1.5 mg and Fentanyl 100 mcg was administered intravenously. Moderate Sedation Time: 10 minutes. The patient's level of consciousness and vital signs were monitored continuously by radiology nursing throughout the procedure under my direct supervision. CONTRAST:  None COMPLICATIONS: None immediate. PROCEDURE: Informed written consent was obtained from the patient after a discussion of the risks, benefits and alternatives to treatment. The patient was placed supine on the CT gantry and a pre procedural CT was performed adequately demonstrating the left sternoclavicular joint (image 15, series 3). The procedure was planned. A timeout was performed prior to the initiation of the procedure. The skin overlying the  anterior aspect the left upper chest was prepped and draped in the usual sterile fashion. The overlying soft tissues were anesthetized with 1% lidocaine with epinephrine. Appropriate trajectory was planned with the use of a 22 gauge spinal  needle. An 18 gauge trocar needle was advanced into the left sternoclavicular joint. Appropriate positioning was confirmed with a limited CT scan (image 11, series 4). Despite appropriate needle positioning, no fluid was able to be aspirated. As such, approximately 2 cc of saline was instilled and subsequently aspirated. All aspirated fluid was capped and sent to the laboratory for analysis. A dressing was placed. The patient tolerated the procedure well without immediate post procedural complication. IMPRESSION: Successful CT guided lavage aspiration of the left sternoclavicular joint. Samples were sent to the laboratory as requested by the ordering clinical team. Electronically Signed   By: Simonne Come M.D.   On: 12/27/2018 15:11   Dg Chest Portable 1 View  Result Date: 12/25/2018 CLINICAL DATA:  43 year old male with pain and swelling in the left shoulder and left collarbone. EXAM: PORTABLE CHEST 1 VIEW COMPARISON:  Chest radiograph dated 12/28/2017 FINDINGS: There is an area of opacity in the left upper lobe medially and abutting the mediastinum which may represent atelectasis or infiltrate. A mass, fluid collection, or hematoma is not excluded. There is slight shift of the trachea to the right of the midline. This can be better evaluated with CT if clinically indicated. There is mild eventration of the left hemidiaphragm. The right lung is clear. There is no pleural effusion or pneumothorax. The cardiac silhouette is within normal limits. No acute osseous pathology. IMPRESSION: Ill-defined area of opacity in the left upper lobe medially may represent atelectasis or infiltrate. A mass, fluid collection, or hematoma is not excluded. CT may provide better evaluation if clinically indicated. Electronically Signed   By: Elgie Collard M.D.   On: 12/25/2018 09:19    Microbiology: Recent Results (from the past 240 hour(s))  SARS CORONAVIRUS 2 (TAT 6-24 HRS) Nasopharyngeal Nasopharyngeal Swab     Status:  None   Collection Time: 12/25/18  9:06 AM   Specimen: Nasopharyngeal Swab  Result Value Ref Range Status   SARS Coronavirus 2 NEGATIVE NEGATIVE Final    Comment: (NOTE) SARS-CoV-2 target nucleic acids are NOT DETECTED. The SARS-CoV-2 RNA is generally detectable in upper and lower respiratory specimens during the acute phase of infection. Negative results do not preclude SARS-CoV-2 infection, do not rule out co-infections with other pathogens, and should not be used as the sole basis for treatment or other patient management decisions. Negative results must be combined with clinical observations, patient history, and epidemiological information. The expected result is Negative. Fact Sheet for Patients: HairSlick.no Fact Sheet for Healthcare Providers: quierodirigir.com This test is not yet approved or cleared by the [place] FDA and  has been authorized for detection and/or diagnosis of SARS-CoV-2 by FDA under an Emergency Use Authorization (EUA). This EUA will remain  in effect (meaning this test can be used) for the duration of the COVID-19 declaration under Section 56 4(b)(1) of the Act, 21 U.S.C. section 360bbb-3(b)(1), unless the authorization is terminated or revoked sooner. Performed at [facility], [address], [place], [place] [address]   Culture, blood (routine x 2)     Status: None (Preliminary result)   Collection Time: 12/25/18  9:28 AM   Specimen: BLOOD  Result Value Ref Range Status   Specimen Description BLOOD RIGHT ANTECUBITAL  Final   Special Requests  Final    BOTTLES DRAWN AEROBIC AND ANAEROBIC Blood Culture results may not be optimal due to an excessive volume of blood received in culture bottles   Culture   Final    NO GROWTH 2 DAYS Performed at [facility], [address], [place], [place] [address]    Report Status PENDING  Incomplete  Culture, blood (routine x 2)     Status:  None (Preliminary result)   Collection Time: 12/25/18  9:28 AM   Specimen: BLOOD RIGHT HAND  Result Value Ref Range Status   Specimen Description BLOOD RIGHT HAND  Final   Special Requests   Final    BOTTLES DRAWN AEROBIC AND ANAEROBIC Blood Culture adequate volume   Culture   Final    NO GROWTH 2 DAYS Performed at [facility], [address] [place], [place] [address]    Report Status PENDING  Incomplete  MRSA PCR Screening     Status: None   Collection Time: 12/27/18  8:47 AM   Specimen: Nasal Mucosa; Nasopharyngeal  Result Value Ref Range Status   MRSA by PCR NEGATIVE NEGATIVE Final    Comment:        The GeneXpert MRSA Assay (FDA approved for NASAL specimens only), is one component of a comprehensive MRSA colonization surveillance program. It is not intended to diagnose MRSA infection nor to guide or monitor treatment for MRSA infections. Performed at [facility], [address], [place], [place] [address]   Aerobic/Anaerobic Culture (surgical/deep wound)     Status: None (Preliminary result)   Collection Time: 12/27/18  1:51 PM   Specimen: Abscess  Result Value Ref Range Status   Specimen Description ABSCESS LEFT STERNOCLAVICULAR ASPIRATION  Final   Special Requests Normal  Final   Gram Stain   Final    FEW WBC PRESENT, PREDOMINANTLY PMN NO ORGANISMS SEEN Performed at [facility], [address] [place], [place] [address]    Culture PENDING  Incomplete   Report Status PENDING  Incomplete     Labs: Basic Metabolic Panel: Recent Labs  Lab 12/25/18 0924 12/25/18 1515 12/26/18 0357  NA 135  --  138  K 3.1*  --  4.1  CL 98  --  103  CO2 24  --  25  GLUCOSE 154*  --  108*  BUN 12  --  6  CREATININE 0.51* 0.64 0.69  CALCIUM 9.0  --  8.7*   Liver Function Tests: Recent Labs  Lab 12/25/18 0924  AST 13*  ALT 14  ALKPHOS 72  BILITOT 0.1*  PROT 7.1  ALBUMIN 3.0*   No results for input(s): LIPASE, AMYLASE in the last 168 hours. No  results for input(s): AMMONIA in the last 168 hours. CBC: Recent Labs  Lab 12/25/18 0924 12/25/18 1515 12/26/18 0357 12/27/18 0242  WBC 15.4* 13.8* 14.2* 12.6*  NEUTROABS 10.8*  --   --   --   HGB 13.0 14.4 13.5 12.9*  HCT 39.0 44.0 41.8 38.9*  MCV 81.9 83.0 82.0 82.4  PLT 364 356 364 400   Cardiac Enzymes: No results for input(s): CKTOTAL, CKMB, CKMBINDEX, TROPONINI in the last 168 hours. BNP: BNP (last 3 results) No results for input(s): BNP in the last 8760 hours.  ProBNP (last 3 results) No results for input(s): PROBNP in the last 8760 hours.  CBG: Recent Labs  Lab 12/25/18 1950  GLUCAP 135*       Signed:  Darlin Drop,  MD [facility] 12/27/2018, 6:38 PM

## 2018-12-27 NOTE — Procedures (Signed)
Pre procedural Dx: Left sternoclavicular joint aspiration Post procedural Dx: Same  Technically successful CT guided lavage aspiration of the left [place] joint. All aspirated samples sent to the laboratory for analysis.    EBL: Trace  Complications: None immediate  Ronny Bacon, MD Pager #: [phone]

## 2018-12-30 LAB — CULTURE, BLOOD (ROUTINE X 2)
Culture: NO GROWTH
Culture: NO GROWTH
Special Requests: ADEQUATE

## 2019-01-01 LAB — AEROBIC/ANAEROBIC CULTURE W GRAM STAIN (SURGICAL/DEEP WOUND)
Culture: NO GROWTH
Special Requests: NORMAL

## 2019-09-05 ENCOUNTER — Encounter (HOSPITAL_BASED_OUTPATIENT_CLINIC_OR_DEPARTMENT_OTHER): Payer: Self-pay | Admitting: *Deleted

## 2019-09-05 ENCOUNTER — Emergency Department (HOSPITAL_BASED_OUTPATIENT_CLINIC_OR_DEPARTMENT_OTHER)
Admission: EM | Admit: 2019-09-05 | Discharge: 2019-09-05 | Disposition: A | Discharge status: Home / Self Care | Payer: Self-pay | Attending: Emergency Medicine | Admitting: Emergency Medicine

## 2019-09-05 ENCOUNTER — Other Ambulatory Visit: Payer: Self-pay

## 2019-09-05 DIAGNOSIS — I10 Essential (primary) hypertension: Secondary | ICD-10-CM | POA: Insufficient documentation

## 2019-09-05 DIAGNOSIS — L03113 Cellulitis of right upper limb: Secondary | ICD-10-CM | POA: Insufficient documentation

## 2019-09-05 DIAGNOSIS — F1910 Other psychoactive substance abuse, uncomplicated: Secondary | ICD-10-CM | POA: Insufficient documentation

## 2019-09-05 DIAGNOSIS — R000 Tachycardia, unspecified: Secondary | ICD-10-CM | POA: Insufficient documentation

## 2019-09-05 LAB — CBC WITH DIFFERENTIAL/PLATELET
Abs Immature Granulocytes: 0.02 10*3/uL (ref 0.00–0.07)
Basophils Absolute: 0.1 10*3/uL (ref 0.0–0.1)
Basophils Relative: 1 %
Eosinophils Absolute: 0 10*3/uL (ref 0.0–0.5)
Eosinophils Relative: 0 %
HCT: 40.6 % (ref 39.0–52.0)
Hemoglobin: 13.4 g/dL (ref 13.0–17.0)
Immature Granulocytes: 0 %
Lymphocytes Relative: 20 %
Lymphs Abs: 1.4 10*3/uL (ref 0.7–4.0)
MCH: 27.4 pg (ref 26.0–34.0)
MCHC: 33 g/dL (ref 30.0–36.0)
MCV: 83 fL (ref 80.0–100.0)
Monocytes Absolute: 1.1 10*3/uL — ABNORMAL HIGH (ref 0.1–1.0)
Monocytes Relative: 16 %
Neutro Abs: 4.6 10*3/uL (ref 1.7–7.7)
Neutrophils Relative %: 63 %
Platelets: 248 10*3/uL (ref 150–400)
RBC: 4.89 MIL/uL (ref 4.22–5.81)
RDW: 13.7 % (ref 11.5–15.5)
WBC: 7.3 10*3/uL (ref 4.0–10.5)
nRBC: 0 % (ref 0.0–0.2)

## 2019-09-05 LAB — BASIC METABOLIC PANEL
Anion gap: 10 (ref 5–15)
BUN: 10 mg/dL (ref 6–20)
CO2: 28 mmol/L (ref 22–32)
Calcium: 8.6 mg/dL — ABNORMAL LOW (ref 8.9–10.3)
Chloride: 100 mmol/L (ref 98–111)
Creatinine, Ser: 0.87 mg/dL (ref 0.61–1.24)
GFR calc Af Amer: >60 mL/min (ref >60)
GFR calc non Af Amer: >60 mL/min (ref >60)
Glucose, Bld: 97 mg/dL (ref 70–99)
Potassium: 3.8 mmol/L (ref 3.5–5.1)
Sodium: 138 mmol/L (ref 135–145)

## 2019-09-05 LAB — LACTIC ACID, PLASMA: Lactic Acid, Venous: 1 mmol/L (ref 0.5–1.9)

## 2019-09-05 MED ORDER — LIDOCAINE-EPINEPHRINE (PF) 2 %-1:200000 IJ SOLN
10.0000 mL | Freq: Once | INTRAMUSCULAR | Status: AC
  Administered 2019-09-05: 10 mL
  Filled 2019-09-05: qty 10

## 2019-09-05 MED ORDER — SODIUM CHLORIDE 0.9 % IV BOLUS
1000.0000 mL | Freq: Once | INTRAVENOUS | Status: AC
  Administered 2019-09-05: 1000 mL via INTRAVENOUS

## 2019-09-05 MED ORDER — VANCOMYCIN HCL IN DEXTROSE 1-5 GM/200ML-% IV SOLN
1000.0000 mg | Freq: Once | INTRAVENOUS | Status: AC
  Administered 2019-09-05: 1000 mg via INTRAVENOUS
  Filled 2019-09-05: qty 200

## 2019-09-05 MED ORDER — SULFAMETHOXAZOLE-TRIMETHOPRIM 800-160 MG PO TABS: 1.0000 | ORAL_TABLET | Freq: Two times a day (BID) | ORAL | 0 refills | Status: AC

## 2019-09-05 NOTE — ED Provider Notes (Signed)
[facility] EMERGENCY DEPARTMENT Provider Note   CSN: [id number] Arrival date & time: 09/05/19  1832     History Chief Complaint  Patient presents with  . Hand Pain    Logan Fisher is a 44 y.o. male.  Pt presents to the ED today with left hand redness and swelling.  Pt has a hx of IVDA and said he accidentally stuck himself with his needles.  Redness started 2 days ago.  No f/c.        Past Medical History:  Diagnosis Date  . AC (acromioclavicular) joint bone spurs   . Cellulitis   . Drug abuse (HCC)   . Environmental allergies   . Hiatal hernia   . Hypercholesteremia   . Hypertension     Patient Active Problem List   Diagnosis Date Noted  . Septic arthritis (HCC) 12/25/2018  . IV drug abuse (HCC) 12/25/2018  . Hypokalemia 12/25/2018    Past Surgical History:  Procedure Laterality Date  . HERNIA REPAIR         History reviewed. No pertinent family history.  Social History   Tobacco Use  . Smoking status: Never Smoker  . Smokeless tobacco: Never Used  Vaping Use  . Vaping Use: Never used  Substance Use Topics  . Alcohol use: Yes    Comment: weekly  . Drug use: Yes    Types: Methamphetamines, IV, Cocaine    Comment: heroin and meth     Home Medications Prior to Admission medications   Medication Sig Start Date End Date Taking? Authorizing Provider  sulfamethoxazole-trimethoprim (BACTRIM DS) 800-160 MG tablet Take 1 tablet by mouth 2 (two) times daily for 7 days. 09/05/19 09/12/19  Jacalyn Lefevre, MD    Allergies    Patient has no known allergies.  Review of Systems   Review of Systems  Skin: Positive for wound.  All other systems reviewed and are negative.   Physical Exam Updated Vital Signs BP (!) 167/104 (BP Location: Left Arm)   Pulse 100   Temp 98.5 F (36.9 C) (Oral)   Resp 18   Ht 6\' 5"  (1.956 m)   Wt 86.2 kg   SpO2 98%   BMI 22.53 kg/m   Physical Exam Vitals and nursing note reviewed.  Constitutional:       Appearance: Normal appearance.  HENT:     Head: Normocephalic and atraumatic.     Right Ear: External ear normal.     Left Ear: External ear normal.     Nose: Nose normal.     Mouth/Throat:     Mouth: Mucous membranes are moist.     Pharynx: Oropharynx is clear.  Eyes:     Extraocular Movements: Extraocular movements intact.     Conjunctiva/sclera: Conjunctivae normal.     Pupils: Pupils are equal, round, and reactive to light.  Cardiovascular:     Rate and Rhythm: Regular rhythm. Tachycardia present.     Pulses: Normal pulses.     Heart sounds: Normal heart sounds.  Pulmonary:     Effort: Pulmonary effort is normal.     Breath sounds: Normal breath sounds.  Abdominal:     General: Abdomen is flat. Bowel sounds are normal.     Palpations: Abdomen is soft.  Musculoskeletal:        General: Normal range of motion.     Cervical back: Normal range of motion and neck supple.  Skin:    Capillary Refill: Capillary refill takes less than 2 seconds.  Comments: Redness to right hand.  Abscess on forearm.  Track marks all over body  Neurological:     General: No focal deficit present.     Mental Status: He is alert and oriented to person, place, and time.  Psychiatric:        Mood and Affect: Mood normal.        Behavior: Behavior normal.        Thought Content: Thought content normal.        Judgment: Judgment normal.     ED Results / Procedures / Treatments   Labs (all labs ordered are listed, but only abnormal results are displayed) Labs Reviewed  BASIC METABOLIC PANEL - Abnormal; Notable for the following components:      Result Value   Calcium 8.6 (*)    All other components within normal limits  CBC WITH DIFFERENTIAL/PLATELET - Abnormal; Notable for the following components:   Monocytes Absolute 1.1 (*)    All other components within normal limits  CULTURE, BLOOD (ROUTINE X 2)  CULTURE, BLOOD (ROUTINE X 2)  LACTIC ACID, PLASMA  LACTIC ACID, PLASMA     EKG None  Radiology No results found.  Procedures .Marland KitchenIncision and Drainage  Date/Time: 09/05/2019 10:19 PM Performed by: Jacalyn Lefevre, MD Authorized by: Jacalyn Lefevre, MD   Consent:    Consent obtained:  Verbal   Consent given by:  Patient   Risks discussed:  Bleeding, incomplete drainage and pain   Alternatives discussed:  No treatment Location:    Type:  Abscess   Size:  2   Location:  Upper extremity   Upper extremity location:  Arm   Arm location:  R lower arm Pre-procedure details:    Skin preparation:  Betadine Anesthesia (see MAR for exact dosages):    Anesthesia method:  Local infiltration   Local anesthetic:  Lidocaine 2% WITH epi Procedure type:    Complexity:  Simple Procedure details:    Incision types:  Cruciate   Scalpel blade:  11   Wound management:  Probed and deloculated   Drainage:  Bloody   Drainage amount:  Scant   Wound treatment:  Wound left open   Packing materials:  None Post-procedure details:    Patient tolerance of procedure:  Tolerated well, no immediate complications   (including critical care time)  Medications Ordered in ED Medications  vancomycin (VANCOCIN) IVPB 1000 mg/200 mL premix (1,000 mg Intravenous New Bag/Given 09/05/19 2122)  sodium chloride 0.9 % bolus 1,000 mL (1,000 mLs Intravenous New Bag/Given 09/05/19 2135)  lidocaine-EPINEPHrine (XYLOCAINE W/EPI) 2 %-1:200000 (PF) injection 10 mL (10 mLs Infiltration Given 09/05/19 2133)    ED Course  I have reviewed the triage vital signs and the nursing notes.  Pertinent labs & imaging results that were available during my care of the patient were reviewed by me and considered in my medical decision making (see chart for details).    MDM Rules/Calculators/A&P                          Pt given 1g vancomycin in ED.  His wbc and lactic are nl.  Pt is stable for d/c.  He is instructed to return if worse.  He is to avoid using IV drugs.   Final Clinical Impression(s) / ED  Diagnoses Final diagnoses:  Cellulitis of hand, right  IV drug abuse (HCC)    Rx / DC Orders ED Discharge Orders  Ordered    sulfamethoxazole-trimethoprim (BACTRIM DS) 800-160 MG tablet  2 times daily     Discontinue  Reprint     09/05/19 2218           Jacalyn Lefevre, MD 09/05/19 2220

## 2019-09-05 NOTE — ED Notes (Signed)
Attempted IV start x 2, unsuccessful. Informed RT for ultrasound guided IV start. RT at bedside.

## 2019-09-05 NOTE — ED Triage Notes (Signed)
Pt c/o left hand redness and swelling  X 2 days.   IV drug use

## 2019-09-10 LAB — CULTURE, BLOOD (ROUTINE X 2)
Culture: NO GROWTH
Special Requests: ADEQUATE

## 2020-03-11 ENCOUNTER — Encounter (HOSPITAL_BASED_OUTPATIENT_CLINIC_OR_DEPARTMENT_OTHER): Payer: Self-pay

## 2020-03-11 ENCOUNTER — Other Ambulatory Visit: Payer: Self-pay

## 2020-03-11 ENCOUNTER — Emergency Department (HOSPITAL_BASED_OUTPATIENT_CLINIC_OR_DEPARTMENT_OTHER)
Admission: EM | Admit: 2020-03-11 | Discharge: 2020-03-11 | Disposition: A | Discharge status: Home / Self Care | Payer: Self-pay | Attending: Emergency Medicine | Admitting: Emergency Medicine

## 2020-03-11 ENCOUNTER — Other Ambulatory Visit (HOSPITAL_BASED_OUTPATIENT_CLINIC_OR_DEPARTMENT_OTHER): Payer: Self-pay | Admitting: Emergency Medicine

## 2020-03-11 DIAGNOSIS — L03115 Cellulitis of right lower limb: Secondary | ICD-10-CM | POA: Insufficient documentation

## 2020-03-11 DIAGNOSIS — N481 Balanitis: Secondary | ICD-10-CM | POA: Insufficient documentation

## 2020-03-11 DIAGNOSIS — N342 Other urethritis: Secondary | ICD-10-CM

## 2020-03-11 DIAGNOSIS — N341 Nonspecific urethritis: Secondary | ICD-10-CM | POA: Insufficient documentation

## 2020-03-11 DIAGNOSIS — K0889 Other specified disorders of teeth and supporting structures: Secondary | ICD-10-CM | POA: Insufficient documentation

## 2020-03-11 DIAGNOSIS — L03119 Cellulitis of unspecified part of limb: Secondary | ICD-10-CM

## 2020-03-11 DIAGNOSIS — I10 Essential (primary) hypertension: Secondary | ICD-10-CM | POA: Insufficient documentation

## 2020-03-11 LAB — URINALYSIS, ROUTINE W REFLEX MICROSCOPIC
Bilirubin Urine: NEGATIVE
Glucose, UA: NEGATIVE mg/dL
Ketones, ur: NEGATIVE mg/dL
Nitrite: NEGATIVE
Protein, ur: 30 mg/dL — AB
Specific Gravity, Urine: 1.025 (ref 1.005–1.030)
pH: 6 (ref 5.0–8.0)

## 2020-03-11 LAB — URINALYSIS, MICROSCOPIC (REFLEX): WBC, UA: >50 WBC/hpf (ref 0–5)

## 2020-03-11 LAB — HIV ANTIBODY (ROUTINE TESTING W REFLEX): HIV Screen 4th Generation wRfx: NONREACTIVE

## 2020-03-11 MED ORDER — DOXYCYCLINE HYCLATE 100 MG PO CAPS: 100.0000 mg | ORAL_CAPSULE | Freq: Two times a day (BID) | ORAL | 0 refills | Status: DC

## 2020-03-11 MED ORDER — FLUCONAZOLE 150 MG PO TABS
150.0000 mg | ORAL_TABLET | Freq: Once | ORAL | Status: AC
  Administered 2020-03-11: 150 mg via ORAL
  Filled 2020-03-11: qty 1

## 2020-03-11 MED ORDER — CEFTRIAXONE SODIUM 500 MG IJ SOLR
500.0000 mg | Freq: Once | INTRAMUSCULAR | Status: AC
  Administered 2020-03-11: 500 mg via INTRAMUSCULAR
  Filled 2020-03-11: qty 500

## 2020-03-11 MED ORDER — CLOTRIMAZOLE 1 % EX CREA: TOPICAL_CREAM | CUTANEOUS | 0 refills | Status: DC

## 2020-03-11 MED FILL — DOXYCYCLINE HYCLATE 100 MG: 100 | 10 days supply | Qty: 20 | Fill #0

## 2020-03-11 NOTE — ED Provider Notes (Signed)
[facility] EMERGENCY DEPARTMENT Provider Note  CSN: [id number] Arrival date & time: 03/11/20 1031    History Chief Complaint  Patient presents with  . Penile Discharge  . Dental Pain  . Foot Pain    HPI  Logan Fisher is a 45 y.o. male with history of IVDA presents with several complaints. He reports bilateral lower molar tooth ache for the last couple of days associated with sensitivity to hot and cold foods. He has also had some aching pain in his R foot where he recently injected drugs. Does not think the needle broke off. His main complaint however is swelling of his penis with urethral discharge, burning with urination. Has not had a fever. Does not have known HIV but would like to be checked    Past Medical History:  Diagnosis Date  . AC (acromioclavicular) joint bone spurs   . Cellulitis   . Drug abuse (HCC)   . Environmental allergies   . Hiatal hernia   . Hypercholesteremia   . Hypertension     Past Surgical History:  Procedure Laterality Date  . HERNIA REPAIR      History reviewed. No pertinent family history.  Social History   Tobacco Use  . Smoking status: Never Smoker  . Smokeless tobacco: Never Used  Vaping Use  . Vaping Use: Never used  Substance Use Topics  . Alcohol use: Yes    Comment: weekly  . Drug use: Yes    Types: Methamphetamines, IV, Cocaine    Comment: heroin and meth      Home Medications Prior to Admission medications   Medication Sig Start Date End Date Taking? Authorizing Provider  clotrimazole (LOTRIMIN) 1 % cream Apply to affected area 2 times daily 03/11/20  Yes Pollyann Savoy, MD  doxycycline (VIBRAMYCIN) 100 MG capsule Take 1 capsule (100 mg total) by mouth 2 (two) times daily. 03/11/20  Yes Pollyann Savoy, MD     Allergies    Patient has no known allergies.   Review of Systems   Review of Systems A comprehensive review of systems was completed and negative except as noted in HPI.    Physical  Exam BP (!) 140/104 (BP Location: Right Arm)   Pulse (!) 103   Temp 97.9 F (36.6 C) (Oral)   Resp 18   Ht 6\' 5"  (1.956 m)   Wt 81.6 kg   SpO2 100%   BMI 21.34 kg/m   Physical Exam Vitals and nursing note reviewed.  Constitutional:      Appearance: Normal appearance.  HENT:     Head: Normocephalic and atraumatic.     Nose: Nose normal.     Mouth/Throat:     Mouth: Mucous membranes are moist.     Comments: Dental caries, no signs of abscess Eyes:     Extraocular Movements: Extraocular movements intact.     Conjunctiva/sclera: Conjunctivae normal.  Cardiovascular:     Rate and Rhythm: Normal rate.  Pulmonary:     Effort: Pulmonary effort is normal.     Breath sounds: Normal breath sounds.  Abdominal:     General: Abdomen is flat.     Palpations: Abdomen is soft.     Tenderness: There is no abdominal tenderness.  Genitourinary:    Comments: Markedly swollen penis, including glans and shaft, no sores. There is greenish white urethral discharge. No sores, no testicular swelling. Some testicular discomfort with palpation.  Musculoskeletal:        General: No  swelling. Normal range of motion.     Cervical back: Neck supple.     Comments: Mild erythema to dorsal right foot, no abscess or proximal streaking  Skin:    General: Skin is warm and dry.  Neurological:     General: No focal deficit present.     Mental Status: He is alert.  Psychiatric:        Mood and Affect: Mood normal.      ED Results / Procedures / Treatments   Labs (all labs ordered are listed, but only abnormal results are displayed) Labs Reviewed  URINALYSIS, ROUTINE W REFLEX MICROSCOPIC - Abnormal; Notable for the following components:      Result Value   APPearance CLOUDY (*)    Hgb urine dipstick MODERATE (*)    Protein, ur 30 (*)    Leukocytes,Ua MODERATE (*)    All other components within normal limits  URINALYSIS, MICROSCOPIC (REFLEX) - Abnormal; Notable for the following components:    Bacteria, UA MANY (*)    All other components within normal limits  URINE CULTURE  RPR  HIV ANTIBODY (ROUTINE TESTING W REFLEX)  GC/CHLAMYDIA PROBE AMP ([place]) NOT AT [facility]    EKG None   Radiology No results found.  Procedures Procedures  Medications Ordered in the ED Medications  cefTRIAXone (ROCEPHIN) injection 500 mg (500 mg Intramuscular Given 03/11/20 1143)  fluconazole (DIFLUCAN) tablet 150 mg (150 mg Oral Given 03/11/20 1144)     MDM Rules/Calculators/A&P MDM Patient with multiple infectious complaints, primarily concerned about penile discharge and swelling. Exam is concerning for STI/urethretis with accompanying balanitis. Will check STI panel, including RPR and HIV. Plan empirical treatment for GC/CT with Rocephin and doxycycline which should also cover his foot complaint. One times dose of diflucan to cover possible fungal cause of balanitis.  ED Course  I have reviewed the triage vital signs and the nursing notes.  Pertinent labs & imaging results that were available during my care of the patient were reviewed by me and considered in my medical decision making (see chart for details).     Final Clinical Impression(s) / ED Diagnoses Final diagnoses:  Urethritis  Balanitis  Tooth ache  Cellulitis of foot    Rx / DC Orders ED Discharge Orders         Ordered    doxycycline (VIBRAMYCIN) 100 MG capsule  2 times daily        03/11/20 1210    clotrimazole (LOTRIMIN) 1 % cream        03/11/20 1210           Pollyann Savoy, MD 03/11/20 1210

## 2020-03-11 NOTE — ED Triage Notes (Signed)
Pt reports bilateral lower back dental pain with sensitivity to food and cold. Pt also reports swollen penis with white discharge. Pt also reports swelling and pain in R foot. Pt states he is an IV drug user and last used his foot about a week ago. Pt A&O in NAD and ambulatory to room.

## 2020-03-12 LAB — GC/CHLAMYDIA PROBE AMP (~~LOC~~) NOT AT ARMC
Chlamydia: NEGATIVE
Comment: NEGATIVE
Comment: NORMAL
Neisseria Gonorrhea: POSITIVE — AB

## 2020-03-12 LAB — RPR
RPR Ser Ql: REACTIVE — AB
RPR Titer: 1:1 {titer}

## 2020-03-12 LAB — URINE CULTURE: Culture: NO GROWTH

## 2020-03-15 LAB — T.PALLIDUM AB, TOTAL: T Pallidum Abs: REACTIVE — AB

## 2020-04-14 ENCOUNTER — Encounter (HOSPITAL_BASED_OUTPATIENT_CLINIC_OR_DEPARTMENT_OTHER): Payer: Self-pay | Admitting: Emergency Medicine

## 2020-04-14 ENCOUNTER — Emergency Department (HOSPITAL_BASED_OUTPATIENT_CLINIC_OR_DEPARTMENT_OTHER)
Admission: EM | Admit: 2020-04-14 | Discharge: 2020-04-14 | Disposition: A | Discharge status: Home / Self Care | Payer: Self-pay | Attending: Emergency Medicine | Admitting: Emergency Medicine

## 2020-04-14 ENCOUNTER — Other Ambulatory Visit: Payer: Self-pay

## 2020-04-14 DIAGNOSIS — A549 Gonococcal infection, unspecified: Secondary | ICD-10-CM | POA: Insufficient documentation

## 2020-04-14 DIAGNOSIS — I10 Essential (primary) hypertension: Secondary | ICD-10-CM | POA: Insufficient documentation

## 2020-04-14 DIAGNOSIS — A530 Latent syphilis, unspecified as early or late: Secondary | ICD-10-CM | POA: Insufficient documentation

## 2020-04-14 MED ORDER — PENICILLIN G BENZATHINE 1200000 UNIT/2ML IM SUSP
2.4000 10*6.[IU] | Freq: Once | INTRAMUSCULAR | Status: AC
  Administered 2020-04-14: 2.4 10*6.[IU] via INTRAMUSCULAR
  Filled 2020-04-14: qty 4

## 2020-04-14 MED ORDER — AZITHROMYCIN 1 G PO PACK
2.0000 g | PACK | Freq: Once | ORAL | Status: AC
  Administered 2020-04-14: 2 g via ORAL
  Filled 2020-04-14: qty 2

## 2020-04-14 MED ORDER — CEFTRIAXONE SODIUM 500 MG IJ SOLR
500.0000 mg | Freq: Once | INTRAMUSCULAR | Status: AC
  Administered 2020-04-14: 500 mg via INTRAMUSCULAR
  Filled 2020-04-14: qty 500

## 2020-04-14 MED ORDER — LIDOCAINE HCL (PF) 1 % IJ SOLN
5.0000 mL | Freq: Once | INTRAMUSCULAR | Status: AC
  Administered 2020-04-14: 5 mL
  Filled 2020-04-14: qty 5

## 2020-04-14 NOTE — ED Provider Notes (Signed)
[facility] Provider Note: [name], MD, FACEP  CSN: [id number] MRN: [id number] ARRIVAL: 04/14/20 at 0539 ROOM: [facility]   CHIEF COMPLAINT  Penile Discharge   HISTORY OF PRESENT ILLNESS  04/14/20 5:53 AM Logan Fisher is a 45 y.o. male heterosexual male who was seen 03/11/2020 for urethritis.  He was treated for gonorrhea and chlamydia and testing came back positive for gonorrhea.  Despite treatment he returns with persistent urethral discharge.  The discharge is associated with penile burning which he rates as a 6 out of 10, worse with urination.  He also tested positive for syphilis at the time and has not been treated.  He tested negative for HIV.  Past Medical History:  Diagnosis Date  . AC (acromioclavicular) joint bone spurs   . Cellulitis   . Drug abuse (HCC)   . Environmental allergies   . Hiatal hernia   . Hypercholesteremia   . Hypertension     Past Surgical History:  Procedure Laterality Date  . HERNIA REPAIR      No family history on file.  Social History   Tobacco Use  . Smoking status: Never Smoker  . Smokeless tobacco: Never Used  Vaping Use  . Vaping Use: Never used  Substance Use Topics  . Alcohol use: Yes    Comment: weekly  . Drug use: Yes    Types: Methamphetamines, IV, Cocaine    Comment: heroin and meth     Prior to Admission medications   Medication Sig Start Date End Date Taking? Authorizing Provider  clotrimazole (LOTRIMIN) 1 % cream Apply to affected area 2 times daily 03/11/20   Pollyann Savoy, MD    Allergies Patient has no known allergies.   REVIEW OF SYSTEMS  Negative except as noted here or in the History of Present Illness.   PHYSICAL EXAMINATION  Initial Vital Signs Blood pressure (!) 147/98, pulse (!) 105, temperature 98.6 F (37 C), temperature source Oral, resp. rate 18, height 6\' 5"  (1.956 m), weight 83.9 kg, SpO2 99 %.  Examination General: Well-developed, well-nourished male in no acute  distress; appearance consistent with age of record HENT: normocephalic; atraumatic Eyes: Normal appearance Neck: supple Heart: regular rate and rhythm Lungs: clear to auscultation bilaterally Abdomen: soft; nondistended; nontender; bowel sounds present GU: Tanner V male, circumcised; mild hypospadias; watery urethral discharge Extremities: No deformity; full range of motion Neurologic: Awake, alert and oriented; motor function intact in all extremities and symmetric; no facial droop Skin: Warm and dry; track marks Psychiatric: Normal mood and affect   RESULTS  Summary of this visit's results, reviewed and interpreted by myself:   EKG Interpretation  Date/Time:    Ventricular Rate:    PR Interval:    QRS Duration:   QT Interval:    QTC Calculation:   R Axis:     Text Interpretation:        Laboratory Studies: No results found for this or any previous visit (from the past 24 hour(s)). Imaging Studies: No results found.  ED COURSE and MDM  Nursing notes, initial and subsequent vitals signs, including pulse oximetry, reviewed and interpreted by myself.  Vitals:   04/14/20 0547 04/14/20 0548  BP: (!) 147/98   Pulse: (!) 105   Resp: 18   Temp: 98.6 F (37 C)   TempSrc: Oral   SpO2: 99%   Weight:  83.9 kg  Height:  6\' 5"  (1.956 m)   Medications  cefTRIAXone (ROCEPHIN) injection 500 mg (has no  administration in time range)  azithromycin (ZITHROMAX) powder 2 g (has no administration in time range)  penicillin g benzathine (BICILLIN LA) 1200000 UNIT/2ML injection 2.4 Million Units (has no administration in time range)    We will double treat for gonorrhea using 500 mg of IM Rocephin as well as 2 g of oral Zithromax.  We will also treat for syphilis with 2,400,000 units of Bicillin LA and refer to the [facility].  He was advised to follow-up as important as syphilis could become a chronic condition if not adequately treated early in its course  and we are not sure what stage of the disease he is then.  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 Gonorrhea in male  A21.9        Chealsea Paske, Jonny Ruiz, MD 04/14/20 [phone]

## 2020-04-14 NOTE — ED Triage Notes (Signed)
Pt c/o penial dischage. Pt was seen in January for the same complaint. Pt states that he did complete the abx prescribed but is still having the same problem. Pt denies any pain or problems urinating but is still having penial discharge. Pt aaox3, ambulatory with steady gait, VSS, GCS 15, NAD noted in triage.

## 2020-04-15 LAB — GC/CHLAMYDIA PROBE AMP (~~LOC~~) NOT AT ARMC
Chlamydia: NEGATIVE
Comment: NEGATIVE
Comment: NORMAL
Neisseria Gonorrhea: POSITIVE — AB

## 2020-04-21 ENCOUNTER — Telehealth: Payer: Self-pay

## 2020-04-21 NOTE — Telephone Encounter (Signed)
I have tried to call the patient to schedule him an appointment. Patient did not have a secured voicemail setup and there was not a message left for the patient. Logan Fisher

## 2020-04-21 NOTE — Telephone Encounter (Signed)
-----   Message from Judyann Munson, MD sent at 04/20/2020  4:59 PM EST ----- Regarding: RE: Follow Up Hi Logan Fisher, we will reach out to him to get him into our clinic, and maybe see if he would like to do PREP patient (hiv prophylaxis) ----- Message ----- From: Paula Libra, MD Sent: 04/14/2020   6:15 AM EST To: Judyann Munson, MD Subject: Follow Up                                      I am sending this to you because I did not see a generic email address for the infectious disease clinic, and you are someone with whom I have had pleasant interactions in the past (I believe you treated a couple I referred to you for chikungunya virus).  Please forward this to any colleague as you see fit.  Logan Fisher was treated in the ED on 03/11/2020 for urethritis.  Despite Rocephin 500 mg IM and doxycycline 100 mg orally twice daily for 7 days he returns with persistent urethral discharge.  He did test positive for gonorrhea.  We will double treat for gonorrhea this time with Rocephin 500 mg IM and Zithromax 2 g orally.  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  He had not followed up for treatment of his syphilis and we we will treat him with 2,400,000 units of Bicillin LA IM.  He was advised that because we do not know what stage of syphilis he is at (Latent?  Tertiary?)  he will need follow-up with you [the ID clinic] to ensure adequate treatment.   Thank you.

## 2020-04-23 NOTE — Telephone Encounter (Signed)
RN called patient, but call went to generic voicemail. RN did not leave message, as voicemail was not identified as belonging to the patient. Logan Coss, RN

## 2020-05-05 ENCOUNTER — Emergency Department (HOSPITAL_BASED_OUTPATIENT_CLINIC_OR_DEPARTMENT_OTHER)
Admission: EM | Admit: 2020-05-05 | Discharge: 2020-05-05 | Disposition: A | Discharge status: Home / Self Care | Payer: Self-pay | Attending: Emergency Medicine | Admitting: Emergency Medicine

## 2020-05-05 ENCOUNTER — Other Ambulatory Visit: Payer: Self-pay

## 2020-05-05 ENCOUNTER — Encounter (HOSPITAL_BASED_OUTPATIENT_CLINIC_OR_DEPARTMENT_OTHER): Payer: Self-pay | Admitting: Emergency Medicine

## 2020-05-05 DIAGNOSIS — I10 Essential (primary) hypertension: Secondary | ICD-10-CM | POA: Insufficient documentation

## 2020-05-05 DIAGNOSIS — L03116 Cellulitis of left lower limb: Secondary | ICD-10-CM | POA: Insufficient documentation

## 2020-05-05 MED ORDER — DOXYCYCLINE HYCLATE 100 MG PO CAPS: 100.0000 mg | ORAL_CAPSULE | Freq: Two times a day (BID) | ORAL | 0 refills | Status: DC

## 2020-05-05 MED ORDER — DOXYCYCLINE HYCLATE 100 MG PO TABS
100.0000 mg | ORAL_TABLET | Freq: Once | ORAL | Status: AC
  Administered 2020-05-05: 100 mg via ORAL
  Filled 2020-05-05: qty 1

## 2020-05-05 NOTE — ED Triage Notes (Signed)
PT states has abscess or cellulitis on left lower leg, started about 5-6 am yesterday. Pt states possibly from IV drug use

## 2020-05-05 NOTE — Discharge Instructions (Addendum)
You were seen today for redness of the lower extremity.  This is likely early cellulitis.  Take medications as prescribed.  If you notice increasing redness, fevers, any new or worsening symptoms, you should be reevaluated immediately.

## 2020-05-05 NOTE — ED Provider Notes (Signed)
[facility] EMERGENCY DEPARTMENT Provider Note   CSN: [id number] Arrival date & time: 05/05/20  0345     History No chief complaint on file.   Logan Fisher is a 45 y.o. male.  HPI     This 45 year old male with a history of IV drug abuse, hypertension, septic arthritis who presents with redness of the left lower extremity.  Reports 24-hour history of progressive redness of the left lower extremity.  He reports some moderate pain.  No fevers or systemic symptoms.  He does continue to use IV drugs including heroin and methamphetamine.  He occasionally will inject in his lower extremities but does not feel like he has injected recently there.  Patient states that he is recently lost several friends to IV drug use and is concerned for infection.  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  He denies shortness of breath, chest pain, other systemic symptoms  Past Medical History:  Diagnosis Date  . AC (acromioclavicular) joint bone spurs   . Cellulitis   . Drug abuse (HCC)   . Environmental allergies   . Hiatal hernia   . Hypercholesteremia   . Hypertension     Patient Active Problem List   Diagnosis Date Noted  . Septic arthritis (HCC) 12/25/2018  . IV drug abuse (HCC) 12/25/2018  . Hypokalemia 12/25/2018    Past Surgical History:  Procedure Laterality Date  . HERNIA REPAIR    . IRRIGATION AND DEBRIDEMENT STERNOCLAVICULAR JOINT-STERNUM AND RIBS Left 01/24/2019       History reviewed. No pertinent family history.  Social History   Tobacco Use  . Smoking status: Never Smoker  . Smokeless tobacco: Never Used  Vaping Use  . Vaping Use: Never used  Substance Use Topics  . Alcohol use: Yes    Comment: weekly  . Drug use: Yes    Types: Methamphetamines, IV, Cocaine    Comment: heroin and meth     Home Medications Prior to Admission medications   Medication Sig Start Date End Date Taking?  Authorizing Provider  clotrimazole (LOTRIMIN) 1 % cream Apply to affected area 2 times daily 03/11/20   Pollyann Savoy, MD    Allergies    Patient has no known allergies.  Review of Systems   Review of Systems  Constitutional: Negative for fever.  Respiratory: Negative for shortness of breath.   Cardiovascular: Negative for chest pain.  Gastrointestinal: Negative for abdominal pain, nausea and vomiting.  Skin: Positive for color change.  All other systems reviewed and are negative.   Physical Exam Updated Vital Signs BP (!) 157/104 (BP Location: Right Arm)   Pulse 97   Temp 98.5 F (36.9 C) (Oral)   Resp 18   Ht 1.956 m (6\' 5" )   Wt 83.9 kg   SpO2 100%   BMI 21.94 kg/m   Physical Exam Vitals and nursing note reviewed.  Constitutional:      Appearance: He is well-developed and well-nourished. He is not ill-appearing.  HENT:     Head: Normocephalic and atraumatic.     Nose: Nose normal.     Mouth/Throat:     Mouth: Mucous membranes are moist.  Eyes:     Pupils: Pupils are equal, round, and reactive to light.  Cardiovascular:     Rate and Rhythm: Normal rate and regular rhythm.     Heart sounds: Normal heart sounds. No murmur heard.     Comments: Well-healed scar  superior sternum Pulmonary:     Effort: Pulmonary effort is normal. No respiratory distress.     Breath sounds: Normal breath sounds. No wheezing.  Abdominal:     General: Bowel sounds are normal.     Palpations: Abdomen is soft.     Tenderness: There is no abdominal tenderness. There is no rebound.  Musculoskeletal:        General: No edema.     Cervical back: Neck supple.     Right lower leg: No edema.     Left lower leg: No edema.  Lymphadenopathy:     Cervical: No cervical adenopathy.  Skin:    General: Skin is warm and dry.     Comments: Patchy erythema left lateral lower extremity just proximal to the ankle, slight tenderness to palpation, no fluctuance or induration, blanching   Neurological:     Mental Status: He is alert and oriented to person, place, and time.  Psychiatric:        Mood and Affect: Mood and affect and mood normal.     ED Results / Procedures / Treatments   Labs (all labs ordered are listed, but only abnormal results are displayed) Labs Reviewed - No data to display  EKG None  Radiology No results found.  Procedures Procedures   Medications Ordered in ED Medications  doxycycline (VIBRA-TABS) tablet 100 mg (has no administration in time range)    ED Course  I have reviewed the triage vital signs and the nursing notes.  Pertinent labs & imaging results that were available during my care of the patient were reviewed by me and considered in my medical decision making (see chart for details).    MDM Rules/Calculators/A&P                          Patient presents with redness lower extremity.  IV drug use.  He is overall nontoxic and vital signs are reassuring.  He is afebrile.  Denies any systemic symptoms.  He certainly at increased risk for bacteremia given his IV drug use.  Clinically his presentation is consistent with early cellulitis.  No evidence of abscess or drainable infection.  He has no other symptoms at this time.  Given his history, we discussed utility of basic labs versus just empirically treating for early cellulitis.  Patient would like to forego labs at this time.  He knows he is at increased risk for bacteremia and if he develops fevers or any new or worsening symptoms including worsening redness while taking antibiotics, he needs to be reevaluated immediately.  At this time there is no objective evidence of systemic infection or fever to suggest bacteremia.  After history, exam, and medical workup I feel the patient has been appropriately medically screened and is safe for discharge home. Pertinent diagnoses were discussed with the patient. Patient was given return precautions.  Final Clinical Impression(s) / ED  Diagnoses Final diagnoses:  Cellulitis of left lower extremity    Rx / DC Orders ED Discharge Orders    None       Chauntae Hults, Mayer Masker, MD 05/05/20 [phone]

## 2020-05-06 ENCOUNTER — Emergency Department (HOSPITAL_BASED_OUTPATIENT_CLINIC_OR_DEPARTMENT_OTHER): Payer: Self-pay

## 2020-05-06 ENCOUNTER — Encounter (HOSPITAL_BASED_OUTPATIENT_CLINIC_OR_DEPARTMENT_OTHER): Payer: Self-pay | Admitting: Emergency Medicine

## 2020-05-06 ENCOUNTER — Other Ambulatory Visit: Payer: Self-pay

## 2020-05-06 ENCOUNTER — Emergency Department (HOSPITAL_BASED_OUTPATIENT_CLINIC_OR_DEPARTMENT_OTHER)
Admission: EM | Admit: 2020-05-06 | Discharge: 2020-05-06 | Disposition: A | Discharge status: Home / Self Care | Payer: Self-pay | Attending: Emergency Medicine | Admitting: Emergency Medicine

## 2020-05-06 DIAGNOSIS — I10 Essential (primary) hypertension: Secondary | ICD-10-CM | POA: Insufficient documentation

## 2020-05-06 DIAGNOSIS — L03116 Cellulitis of left lower limb: Secondary | ICD-10-CM | POA: Insufficient documentation

## 2020-05-06 LAB — COMPREHENSIVE METABOLIC PANEL
ALT: 16 U/L (ref 0–44)
AST: 16 U/L (ref 15–41)
Albumin: 3.7 g/dL (ref 3.5–5.0)
Alkaline Phosphatase: 78 U/L (ref 38–126)
Anion gap: 10 (ref 5–15)
BUN: 11 mg/dL (ref 6–20)
CO2: 28 mmol/L (ref 22–32)
Calcium: 8.8 mg/dL — ABNORMAL LOW (ref 8.9–10.3)
Chloride: 100 mmol/L (ref 98–111)
Creatinine, Ser: 0.8 mg/dL (ref 0.61–1.24)
GFR, Estimated: >60 mL/min (ref >60)
Glucose, Bld: 115 mg/dL — ABNORMAL HIGH (ref 70–99)
Potassium: 3.6 mmol/L (ref 3.5–5.1)
Sodium: 138 mmol/L (ref 135–145)
Total Bilirubin: <0.1 mg/dL — ABNORMAL LOW (ref 0.3–1.2)
Total Protein: 7.5 g/dL (ref 6.5–8.1)

## 2020-05-06 LAB — CBC WITH DIFFERENTIAL/PLATELET
Abs Immature Granulocytes: 0.02 10*3/uL (ref 0.00–0.07)
Basophils Absolute: 0 10*3/uL (ref 0.0–0.1)
Basophils Relative: 0 %
Eosinophils Absolute: 0.1 10*3/uL (ref 0.0–0.5)
Eosinophils Relative: 1 %
HCT: 38 % — ABNORMAL LOW (ref 39.0–52.0)
Hemoglobin: 12.3 g/dL — ABNORMAL LOW (ref 13.0–17.0)
Immature Granulocytes: 0 %
Lymphocytes Relative: 23 %
Lymphs Abs: 1.8 10*3/uL (ref 0.7–4.0)
MCH: 25.9 pg — ABNORMAL LOW (ref 26.0–34.0)
MCHC: 32.4 g/dL (ref 30.0–36.0)
MCV: 80.2 fL (ref 80.0–100.0)
Monocytes Absolute: 1.1 10*3/uL — ABNORMAL HIGH (ref 0.1–1.0)
Monocytes Relative: 14 %
Neutro Abs: 4.9 10*3/uL (ref 1.7–7.7)
Neutrophils Relative %: 62 %
Platelets: 271 10*3/uL (ref 150–400)
RBC: 4.74 MIL/uL (ref 4.22–5.81)
RDW: 14.2 % (ref 11.5–15.5)
WBC: 7.9 10*3/uL (ref 4.0–10.5)
nRBC: 0 % (ref 0.0–0.2)

## 2020-05-06 MED ORDER — CEFTRIAXONE SODIUM 1 G IJ SOLR: 1.0000 g | Freq: Once | INTRAMUSCULAR | Status: DC

## 2020-05-06 MED ORDER — SODIUM CHLORIDE 0.9 % IV SOLN
INTRAVENOUS | Status: DC | PRN
  Administered 2020-05-06: 100 mL via INTRAVENOUS

## 2020-05-06 MED ORDER — SODIUM CHLORIDE 0.9 % IV SOLN: 1.0000 g | Freq: Once | INTRAVENOUS | Status: DC

## 2020-05-06 MED ORDER — SODIUM CHLORIDE 0.9 % IV SOLN
1.0000 g | Freq: Once | INTRAVENOUS | Status: AC
  Administered 2020-05-06: 1 g via INTRAVENOUS
  Filled 2020-05-06: qty 10

## 2020-05-06 NOTE — Discharge Instructions (Signed)
If you develop fever, vomiting, new or worsening swelling, redness, pain, or any other new/concerning symptoms then return to the ER for evaluation.

## 2020-05-06 NOTE — ED Triage Notes (Signed)
Pt was seen yesterday morning and was diagnosed with cellulitis of his left lower leg  Pt states he was started on Doxycycline  Pt states his leg is worse today, more redness and the pain is worse

## 2020-05-06 NOTE — ED Notes (Signed)
Attempted IV start x 2, unsuccessful. Provider aware.

## 2020-05-06 NOTE — ED Provider Notes (Signed)
[facility] EMERGENCY DEPARTMENT Provider Note   CSN: [id number] Arrival date & time: 05/06/20  0540     History Chief Complaint  Patient presents with  . Cellulitis    Logan Fisher is a 45 y.o. male.  The history is provided by the patient.  Leg Pain Location:  Leg Leg location:  L lower leg Pain details:    Quality:  Aching   Severity:  Moderate   Onset quality:  Gradual   Duration:  2 days   Timing:  Constant   Progression:  Worsening Chronicity:  New Relieved by:  Rest Worsened by:  Bearing weight Associated symptoms: no back pain and no fever    Patient with extensive history including hypertension, hyperlipidemia, IV drug abuse, history of septic arthritis presents with cellulitis to the left leg.  Patient reports this has been ongoing for at least 2 days.  Patient was seen in the ER on March 2 and given doxycycline.  He took 1 dose in the emergency department but was not able to get it filled and only had one other dose.  Since that time the redness and swelling has worsened.  No fevers but reports chills.   He is unsure if he has injected his left leg recently Past Medical History:  Diagnosis Date  . AC (acromioclavicular) joint bone spurs   . Cellulitis   . Drug abuse (HCC)   . Environmental allergies   . Hiatal hernia   . Hypercholesteremia   . Hypertension     Patient Active Problem List   Diagnosis Date Noted  . Septic arthritis (HCC) 12/25/2018  . IV drug abuse (HCC) 12/25/2018  . Hypokalemia 12/25/2018    Past Surgical History:  Procedure Laterality Date  . HERNIA REPAIR    . IRRIGATION AND DEBRIDEMENT STERNOCLAVICULAR JOINT-STERNUM AND RIBS Left 01/24/2019       History reviewed. No pertinent family history.  Social History   Tobacco Use  . Smoking status: Never Smoker  . Smokeless tobacco: Never Used  Vaping Use  . Vaping Use: Never used  Substance Use Topics  . Alcohol use: Yes    Comment: weekly  . Drug use: Yes     Types: Methamphetamines, IV, Cocaine    Comment: heroin and meth     Home Medications Prior to Admission medications   Medication Sig Start Date End Date Taking? Authorizing Provider  clotrimazole (LOTRIMIN) 1 % cream Apply to affected area 2 times daily 03/11/20   Pollyann Savoy, MD  doxycycline (VIBRAMYCIN) 100 MG capsule Take 1 capsule (100 mg total) by mouth 2 (two) times daily. 05/05/20   Horton, Mayer Masker, MD    Allergies    Patient has no known allergies.  Review of Systems   Review of Systems  Constitutional: Positive for chills. Negative for fever.  Respiratory: Negative for shortness of breath.   Cardiovascular: Negative for chest pain.  Gastrointestinal: Positive for constipation.       Chronic constipation  Musculoskeletal: Negative for back pain.  Skin: Positive for color change and wound.  Neurological: Negative for weakness and numbness.  All other systems reviewed and are negative.   Physical Exam Updated Vital Signs BP (!) 148/97 (BP Location: Left Arm)   Pulse (!) 104   Temp 98.3 F (36.8 C) (Oral)   Resp 20   Ht 1.956 m (6\' 5" )   Wt 83.9 kg   SpO2 100%   BMI 21.94 kg/m   Physical Exam CONSTITUTIONAL: Disheveled,  anxious HEAD: Normocephalic/atraumatic EYES: EOMI ENMT: Mucous membranes moist NECK: supple no meningeal signs SPINE/BACK:entire spine nontender CV: S1/S2 noted, no loud harsh murmurs noted LUNGS: Lungs are clear to auscultation bilaterally, no apparent distress ABDOMEN: soft, nontender NEURO: Pt is awake/alert/appropriate, moves all extremitiesx4.  No facial droop.   EXTREMITIES: pulses normal/equal, full ROM, tenderness noted to left lower extremity/tibial surface.  No crepitus.  No fluctuance or active drainage.  He can fully range both left knee and left ankle without difficulty.  No calf tenderness. There is no erythematous streaking noted SKIN: warm, color normal, scattered track marks throughout all 4 extremities.  Aside from the  lesion on his left leg, no other signs of cellulitis or abscess to his extremities PSYCH: Anxious        Patient gave verbal permission to utilize photo for medical documentation only The image was not stored on any personal device   ED Results / Procedures / Treatments   Labs (all labs ordered are listed, but only abnormal results are displayed) Labs Reviewed  CULTURE, BLOOD (ROUTINE X 2)  CULTURE, BLOOD (ROUTINE X 2)  LACTIC ACID, PLASMA  LACTIC ACID, PLASMA  COMPREHENSIVE METABOLIC PANEL  CBC WITH DIFFERENTIAL/PLATELET    EKG None  Radiology No results found.  Procedures Procedures   Medications Ordered in ED Medications  cefTRIAXone (ROCEPHIN) 1 g in sodium chloride 0.9 % 100 mL IVPB (has no administration in time range)    ED Course  I have reviewed the triage vital signs and the nursing notes.  Pertinent labs & imaging results that were available during my care of the patient were reviewed by me and considered in my medical decision making (see chart for details).    MDM Rules/Calculators/A&P                          7:06 AM Plan to give parenteral antibiotics and x-ray.  Labs are pending at this time.  Signed out to Dr. Darleen Crocker with work-up pending Final Clinical Impression(s) / ED Diagnoses Final diagnoses:  None    Rx / DC Orders ED Discharge Orders    None       Zadie Rhine, MD 05/06/20 [phone]

## 2020-05-06 NOTE — ED Provider Notes (Signed)
Care transferred to me.  Patient is hemodynamically stable.  His labs are overall reassuring.  For what ever reason the lactate did run but given normal anion gap, normal WBC, and well appearance I have very low suspicion this is sepsis.  We discussed that with only 2 doses of doxycycline is probably too early to tell if this is treatment failure.  Continue the doxycycline.  He was given IV Rocephin per prior plan.  Will discharge with return precautions.  Wet read from radiology (the current radiology system is down) indicates no obvious gas or significant findings besides some swelling in his leg.   Pricilla Loveless, MD 05/06/20 [phone]

## 2020-05-11 LAB — CULTURE, BLOOD (SINGLE)
Culture: NO GROWTH
Special Requests: ADEQUATE

## 2020-06-03 ENCOUNTER — Other Ambulatory Visit: Payer: Self-pay

## 2020-06-03 ENCOUNTER — Emergency Department (HOSPITAL_BASED_OUTPATIENT_CLINIC_OR_DEPARTMENT_OTHER)
Admission: EM | Admit: 2020-06-03 | Discharge: 2020-06-03 | Disposition: A | Discharge status: Home / Self Care | Payer: Self-pay | Attending: Emergency Medicine | Admitting: Emergency Medicine

## 2020-06-03 ENCOUNTER — Encounter (HOSPITAL_BASED_OUTPATIENT_CLINIC_OR_DEPARTMENT_OTHER): Payer: Self-pay

## 2020-06-03 DIAGNOSIS — I10 Essential (primary) hypertension: Secondary | ICD-10-CM | POA: Insufficient documentation

## 2020-06-03 DIAGNOSIS — Z0101 Encounter for examination of eyes and vision with abnormal findings: Secondary | ICD-10-CM

## 2020-06-03 DIAGNOSIS — H538 Other visual disturbances: Secondary | ICD-10-CM | POA: Insufficient documentation

## 2020-06-03 MED ORDER — FLUORESCEIN SODIUM 1 MG OP STRP
1.0000 | ORAL_STRIP | Freq: Once | OPHTHALMIC | Status: AC
  Administered 2020-06-03: 1 via OPHTHALMIC
  Filled 2020-06-03: qty 1

## 2020-06-03 MED ORDER — TETRACAINE HCL 0.5 % OP SOLN
2.0000 [drp] | Freq: Once | OPHTHALMIC | Status: AC
  Administered 2020-06-03: 2 [drp] via OPHTHALMIC
  Filled 2020-06-03: qty 4

## 2020-06-03 NOTE — ED Provider Notes (Signed)
[facility] EMERGENCY DEPARTMENT Provider Note   CSN: [id number] Arrival date & time: 06/03/20  8338     History Chief Complaint  Patient presents with  . Eye Problem    Logan Fisher is a 45 y.o. male.  Pt presents to the ED today with decreased vision to the left eye.  Pt said he first noticed it on 3/25.  He said he was driving to a friend's house around 11:30 or 12 noon.  He said it happened suddenly.  He can see a little bit out of the periphery of his left eye.  No trauma.  He does have a hx of IVDA.          Past Medical History:  Diagnosis Date  . AC (acromioclavicular) joint bone spurs   . Cellulitis   . Drug abuse (HCC)   . Environmental allergies   . Hiatal hernia   . Hypercholesteremia   . Hypertension     Patient Active Problem List   Diagnosis Date Noted  . Septic arthritis (HCC) 12/25/2018  . IV drug abuse (HCC) 12/25/2018  . Hypokalemia 12/25/2018    Past Surgical History:  Procedure Laterality Date  . HERNIA REPAIR    . IRRIGATION AND DEBRIDEMENT STERNOCLAVICULAR JOINT-STERNUM AND RIBS Left 01/24/2019       History reviewed. No pertinent family history.  Social History   Tobacco Use  . Smoking status: Never Smoker  . Smokeless tobacco: Never Used  Vaping Use  . Vaping Use: Never used  Substance Use Topics  . Alcohol use: Yes    Comment: weekly  . Drug use: Yes    Types: Methamphetamines, IV, Cocaine    Comment: heroin and meth     Home Medications Prior to Admission medications   Medication Sig Start Date End Date Taking? Authorizing Provider  clotrimazole (LOTRIMIN) 1 % cream Apply to affected area 2 times daily 03/11/20   Logan Savoy, MD  doxycycline (VIBRAMYCIN) 100 MG capsule Take 1 capsule (100 mg total) by mouth 2 (two) times daily. 05/05/20   Horton, Mayer Masker, MD    Allergies    Patient has no known allergies.  Review of Systems   Review of Systems  Eyes: Positive for visual disturbance.     Physical Exam Updated Vital Signs BP (!) 155/100   Pulse 96   Temp 98.3 F (36.8 C) (Oral)   Resp 16   Ht 6\' 5"  (1.956 m)   Wt 84 kg   SpO2 100%   BMI 21.96 kg/m   Physical Exam Vitals and nursing note reviewed.  Constitutional:      Appearance: Normal appearance.  HENT:     Head: Normocephalic and atraumatic.     Right Ear: External ear normal.     Left Ear: External ear normal.     Nose: Nose normal.     Mouth/Throat:     Mouth: Mucous membranes are moist.  Eyes:     General: Lids are normal.     Pupils: Pupils are equal, round, and reactive to light.     Left eye: No corneal abrasion or fluorescein uptake.  Cardiovascular:     Rate and Rhythm: Normal rate and regular rhythm.     Pulses: Normal pulses.     Heart sounds: Normal heart sounds.  Pulmonary:     Effort: Pulmonary effort is normal.     Breath sounds: Normal breath sounds.  Abdominal:     General: Abdomen is flat. Bowel  sounds are normal.     Palpations: Abdomen is soft.  Musculoskeletal:        General: Normal range of motion.     Cervical back: Normal range of motion and neck supple.  Skin:    General: Skin is warm.     Capillary Refill: Capillary refill takes less than 2 seconds.     Comments: Multiple track marks  Neurological:     General: No focal deficit present.     Mental Status: He is alert and oriented to person, place, and time.  Psychiatric:        Mood and Affect: Mood normal.        Behavior: Behavior normal.        Thought Content: Thought content normal.        Judgment: Judgment normal.     ED Results / Procedures / Treatments   Labs (all labs ordered are listed, but only abnormal results are displayed) Labs Reviewed - No data to display  EKG None  Radiology No results found.  Procedures Procedures   Medications Ordered in ED Medications  fluorescein ophthalmic strip 1 strip (1 strip Left Eye Given by Other 06/03/20 0921)  tetracaine (PONTOCAINE) 0.5 %  ophthalmic solution 2 drop (2 drops Left Eye Given by Other 06/03/20 0737)    ED Course  I have reviewed the triage vital signs and the nursing notes.  Pertinent labs & imaging results that were available during my care of the patient were reviewed by me and considered in my medical decision making (see chart for details).    MDM Rules/Calculators/A&P                            Visual Acuity  Right Eye Distance: 20/30 Left Eye Distance: 20/200 Bilateral Distance: 20/25  Right Eye Near:   Left Eye Near:    Bilateral Near:      Pt is d/w Dr. Randon Fisher (ophthalmology).  Pt to go straight there.  It is possible pt may have a retinal a. Occlusion.  If his retinal exam is nl, pt will have to go to [facility] for a MRI.  Pt is aware of that possibility.  Pt knows to go directly to Dr. Karie Fisher office.   Final Clinical Impression(s) / ED Diagnoses Final diagnoses:  Visual testing abnormal    Rx / DC Orders ED Discharge Orders    None       Logan Lefevre, MD 06/03/20 1055

## 2020-06-03 NOTE — ED Triage Notes (Signed)
Pt states beginning one week ago began losing visual field left eye, recent treatment with antibiotics, pt states has not been taking consistently, denies drainage from eye.  Sclera slightly reddened

## 2020-06-11 ENCOUNTER — Other Ambulatory Visit: Payer: Self-pay | Admitting: Ophthalmology

## 2020-06-14 ENCOUNTER — Encounter (HOSPITAL_COMMUNITY): Admission: RE | Payer: Self-pay | Source: Ambulatory Visit

## 2020-06-14 ENCOUNTER — Ambulatory Visit (HOSPITAL_COMMUNITY): Admission: RE | Admit: 2020-06-14 | Payer: Self-pay | Source: Ambulatory Visit | Admitting: Ophthalmology

## 2020-06-14 SURGERY — SCLERAL BUCKLE
Anesthesia: General | Laterality: Left

## 2020-06-16 ENCOUNTER — Encounter (HOSPITAL_COMMUNITY): Payer: Self-pay

## 2020-06-16 ENCOUNTER — Emergency Department (HOSPITAL_COMMUNITY)
Admission: EM | Admit: 2020-06-16 | Discharge: 2020-06-16 | Disposition: A | Discharge status: Home / Self Care | Payer: Self-pay | Attending: Emergency Medicine | Admitting: Emergency Medicine

## 2020-06-16 ENCOUNTER — Other Ambulatory Visit: Payer: Self-pay

## 2020-06-16 DIAGNOSIS — G8918 Other acute postprocedural pain: Secondary | ICD-10-CM | POA: Insufficient documentation

## 2020-06-16 DIAGNOSIS — I10 Essential (primary) hypertension: Secondary | ICD-10-CM | POA: Insufficient documentation

## 2020-06-16 DIAGNOSIS — Z8669 Personal history of other diseases of the nervous system and sense organs: Secondary | ICD-10-CM

## 2020-06-16 DIAGNOSIS — Z9889 Other specified postprocedural states: Secondary | ICD-10-CM

## 2020-06-16 DIAGNOSIS — H1032 Unspecified acute conjunctivitis, left eye: Secondary | ICD-10-CM

## 2020-06-16 DIAGNOSIS — Z20822 Contact with and (suspected) exposure to covid-19: Secondary | ICD-10-CM | POA: Insufficient documentation

## 2020-06-16 DIAGNOSIS — H1132 Conjunctival hemorrhage, left eye: Secondary | ICD-10-CM | POA: Insufficient documentation

## 2020-06-16 LAB — CBC
HCT: 40.8 % (ref 39.0–52.0)
Hemoglobin: 13.2 g/dL (ref 13.0–17.0)
MCH: 26.3 pg (ref 26.0–34.0)
MCHC: 32.4 g/dL (ref 30.0–36.0)
MCV: 81.3 fL (ref 80.0–100.0)
Platelets: 293 K/uL (ref 150–400)
RBC: 5.02 MIL/uL (ref 4.22–5.81)
RDW: 14.9 % (ref 11.5–15.5)
WBC: 7.5 K/uL (ref 4.0–10.5)
nRBC: 0 % (ref 0.0–0.2)

## 2020-06-16 LAB — BASIC METABOLIC PANEL WITH GFR
Anion gap: 7 (ref 5–15)
BUN: 11 mg/dL (ref 6–20)
CO2: 27 mmol/L (ref 22–32)
Calcium: 8.8 mg/dL — ABNORMAL LOW (ref 8.9–10.3)
Chloride: 102 mmol/L (ref 98–111)
Creatinine, Ser: 0.79 mg/dL (ref 0.61–1.24)
GFR, Estimated: >60 mL/min
Glucose, Bld: 106 mg/dL — ABNORMAL HIGH (ref 70–99)
Potassium: 3.7 mmol/L (ref 3.5–5.1)
Sodium: 136 mmol/L (ref 135–145)

## 2020-06-16 LAB — RESP PANEL BY RT-PCR (FLU A&B, COVID) ARPGX2
Influenza A by PCR: NEGATIVE
Influenza B by PCR: NEGATIVE
SARS Coronavirus 2 by RT PCR: NEGATIVE

## 2020-06-16 MED ORDER — PREDNISOLONE ACETATE 1 % OP SUSP
2.0000 [drp] | Freq: Once | OPHTHALMIC | Status: AC
  Administered 2020-06-16: 2 [drp] via OPHTHALMIC
  Filled 2020-06-16: qty 5

## 2020-06-16 MED ORDER — OFLOXACIN 0.3 % OP SOLN
2.0000 [drp] | Freq: Three times a day (TID) | OPHTHALMIC | Status: DC
  Administered 2020-06-16: 2 [drp] via OPHTHALMIC
  Filled 2020-06-16: qty 5

## 2020-06-16 NOTE — ED Provider Notes (Signed)
[facility] EMERGENCY DEPARTMENT Provider Note   CSN: [id number] Arrival date & time: 06/16/20  0230     History Chief Complaint  Patient presents with  . Eye Problem  . Post-op Problem    Logan Fisher is a 45 y.o. male.  Patient is a 45 year old male with history of hypertension, hyperlipidemia, and IV drug abuse.  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  This was performed by Dr. Allyne Gee.  Patient was discharged with ofloxacin drops and steroid drops, however did not fill these prescriptions.  This evening he bent over to pick a pen off of the floor when he felt a pop in his eye followed by a small amount of bleeding.  He also describes a yellow drainage coming from the eye.  He denies visual loss.  He denies any fevers or chills.  He attempted to reach the ophthalmologist, however was unsuccessful and presents here.  The history is provided by the patient.       Past Medical History:  Diagnosis Date  . AC (acromioclavicular) joint bone spurs   . Cellulitis   . Drug abuse (HCC)   . Environmental allergies   . Hiatal hernia   . Hypercholesteremia   . Hypertension     Patient Active Problem List   Diagnosis Date Noted  . Septic arthritis (HCC) 12/25/2018  . IV drug abuse (HCC) 12/25/2018  . Hypokalemia 12/25/2018    Past Surgical History:  Procedure Laterality Date  . HERNIA REPAIR    . IRRIGATION AND DEBRIDEMENT STERNOCLAVICULAR JOINT-STERNUM AND RIBS Left 01/24/2019       History reviewed. No pertinent family history.  Social History   Tobacco Use  . Smoking status: Never Smoker  . Smokeless tobacco: Never Used  Vaping Use  . Vaping Use: Never used  Substance Use Topics  . Alcohol use: Yes    Comment: weekly  . Drug use: Yes    Types: Methamphetamines, IV, Cocaine    Comment: heroin and meth     Home Medications Prior to Admission medications   Medication Sig Start Date  End Date Taking? Authorizing Provider  clotrimazole (LOTRIMIN) 1 % cream APPLY TO THE AFFECTED AREA(S) TWICE DAILY 03/11/20 03/11/21  Pollyann Savoy, MD  doxycycline (VIBRAMYCIN) 100 MG capsule Take 1 capsule (100 mg total) by mouth 2 (two) times daily. 05/05/20   Horton, Mayer Masker, MD    Allergies    Patient has no known allergies.  Review of Systems   Review of Systems  All other systems reviewed and are negative.   Physical Exam Updated Vital Signs BP (!) 150/108 (BP Location: Left Arm)   Pulse 81   Temp 98.3 F (36.8 C) (Oral)   Resp 16   Ht 6\' 5"  (1.956 m)   Wt 84 kg   SpO2 99%   BMI 21.96 kg/m   Physical Exam Vitals and nursing note reviewed.  Constitutional:      General: He is not in acute distress.    Appearance: Normal appearance. He is not ill-appearing.  Eyes:     Comments: The left eye is noted to have conjunctival injection with yellowish discharge present along with yellowish crusting of the eyelashes.  There is no bleeding.  Exam is somewhat limited secondary to patient tolerance, but pupil seems reactive.  Pulmonary:     Effort: Pulmonary effort is normal.  Skin:    General: Skin is warm and  dry.  Neurological:     Mental Status: He is alert.          ED Results / Procedures / Treatments   Labs (all labs ordered are listed, but only abnormal results are displayed) Labs Reviewed  BASIC METABOLIC PANEL - Abnormal; Notable for the following components:      Result Value   Glucose, Bld 106 (*)    Calcium 8.8 (*)    All other components within normal limits  RESP PANEL BY RT-PCR (FLU A&B, COVID) ARPGX2  CBC    EKG None  Radiology No results found.  Procedures Procedures   Medications Ordered in ED Medications - No data to display  ED Course  I have reviewed the triage vital signs and the nursing notes.  Pertinent labs & imaging results that were available during my care of the patient were reviewed by me and considered in my  medical decision making (see chart for details).    MDM Rules/Calculators/A&P  Patient presenting with complaints of eye bleeding and drainage.  He is 2 days status post repair of retinal detachment by Dr. Allyne Gee.  He felt a pop in his eye when he bent over to pick something up off the floor followed by bleeding and yellowish drainage.  Patient has injected conjunctiva and purulent drainage matted in his eyelashes and conjunctiva (see picture).  He denies any change in his vision.  Care was discussed with Dr. Jenene Slicker from ophthalmology.  She does not feel as though any acute intervention is indicated and recommends him follow-up with his surgeon in the morning.  Patient will be advised of these recommendations.  Patient is also not filled his ofloxacin and prednisone drops due to financial reasons.  These medications will be administered here and he will be sent home with these medications.  Message sent through the electronic medical record to Dr. Allyne Gee to notify him that the patient was here and need for follow-up.  Final Clinical Impression(s) / ED Diagnoses Final diagnoses:  None    Rx / DC Orders ED Discharge Orders    None       Geoffery Lyons, MD 06/16/20 0502

## 2020-06-16 NOTE — ED Triage Notes (Signed)
Emergency Medicine Provider Triage Evaluation Note  Logan Fisher , a 45 y.o. male  was evaluated in triage.  Pt complains of postoperative problem with left eye swelling, bleeding, and pain.  Patient underwent a scleral buckle with gas bubble and fluid drainage on 411 secondary to retinal detachment.  He was discharged with ofloxacin drops and prednisolone drops and was advised to lay flat after the surgery.  He reports he has not been laying flat and has not yet started the drops since the procedure due to difficulty obtaining the prescriptions.  Earlier tonight, he was at dinner and reached down to pick up something and felt a pop behind his left eye.  Since that time, he has had a small amount of bleeding and yellow drainage from the left eye.  No fever, chills, right eye complaints, chest pain, or shortness of breath.  Review of Systems  Positive: Eye drainage, pain, swelling Negative: Fever, chills, right eye complaints, chest pain, shortness of breath  Physical Exam  BP (!) 150/108 (BP Location: Left Arm)   Pulse 81   Temp 98.3 F (36.8 C) (Oral)   Resp 16   Ht 6\' 5"  (1.956 m)   Wt 84 kg   SpO2 99%   BMI 21.96 kg/m  Gen:   Awake, no distress   HEENT:  Left eyelids are edematous.  Minimally warm.  Purulent drainage noted from the left eye and a scant amount of blood noted to the sclera.  Bleeding is controlled. Resp:  Normal effort  Cardiac:  Normal rate  Abd:   Nondistended, nontender  MSK:   Moves extremities without difficulty  Neuro:  Speech clear   Medical Decision Making  Medically screening exam initiated at 2:42 AM.  Appropriate orders placed.  was informed that the remainder of the evaluation will be completed by another provider, this initial triage assessment does not replace that evaluation, and the importance of remaining in the ED until their evaluation is complete.  Clinical Impression  Patient presenting with postop complications after he had  surgery for retinal detachment 2 days ago.  He has not started his postoperative medications.  On exam, he has purulent drainage noted to the left eye.  There is blood noted at the sclera, but bleeding is controlled.  He will require a consult with ophthalmology.  Will order basic screening labs and COVID-19 test as there may be additional surgical interventions warranted.  Patient will require further work-up and evaluation in the emergency department.   Penni Homans A, PA-C 06/16/20 [phone]

## 2020-06-16 NOTE — ED Triage Notes (Signed)
Pt had surgery for retinal detachment two days ago (Monday) and since then has not been able to get eye gtts. Pt states he was at dinner tonight and reached for something and felt a pop behind left eye, he then had bleeding and yellow discharge.

## 2020-06-16 NOTE — Discharge Instructions (Addendum)
Use the ofloxacin and Pred Forte drops, 1 drop in the left eye every 4 hours while awake.  Call Dr. Allyne Gee this morning to arrange a follow-up appointment for today.  His contact information has been provided in this discharge summary for you to call and make these arrangements.

## 2020-08-16 ENCOUNTER — Encounter: Payer: Self-pay | Admitting: Emergency Medicine

## 2020-08-16 ENCOUNTER — Emergency Department (HOSPITAL_BASED_OUTPATIENT_CLINIC_OR_DEPARTMENT_OTHER): Payer: Self-pay

## 2020-08-16 ENCOUNTER — Inpatient Hospital Stay (HOSPITAL_BASED_OUTPATIENT_CLINIC_OR_DEPARTMENT_OTHER)
Admission: EM | Admit: 2020-08-16 | Discharge: 2020-08-21 | DRG: 176 | Disposition: A | Discharge status: Home / Self Care | Payer: Self-pay | Attending: Internal Medicine | Admitting: Internal Medicine

## 2020-08-16 ENCOUNTER — Encounter (HOSPITAL_BASED_OUTPATIENT_CLINIC_OR_DEPARTMENT_OTHER): Payer: Self-pay | Admitting: *Deleted

## 2020-08-16 ENCOUNTER — Other Ambulatory Visit: Payer: Self-pay

## 2020-08-16 ENCOUNTER — Emergency Department (HOSPITAL_COMMUNITY): Payer: Self-pay

## 2020-08-16 DIAGNOSIS — K449 Diaphragmatic hernia without obstruction or gangrene: Secondary | ICD-10-CM | POA: Diagnosis present

## 2020-08-16 DIAGNOSIS — E785 Hyperlipidemia, unspecified: Secondary | ICD-10-CM | POA: Diagnosis present

## 2020-08-16 DIAGNOSIS — F1910 Other psychoactive substance abuse, uncomplicated: Secondary | ICD-10-CM | POA: Insufficient documentation

## 2020-08-16 DIAGNOSIS — Z20822 Contact with and (suspected) exposure to covid-19: Secondary | ICD-10-CM | POA: Diagnosis present

## 2020-08-16 DIAGNOSIS — B9689 Other specified bacterial agents as the cause of diseases classified elsewhere: Secondary | ICD-10-CM | POA: Diagnosis present

## 2020-08-16 DIAGNOSIS — Z833 Family history of diabetes mellitus: Secondary | ICD-10-CM

## 2020-08-16 DIAGNOSIS — I2690 Septic pulmonary embolism without acute cor pulmonale: Principal | ICD-10-CM | POA: Diagnosis present

## 2020-08-16 DIAGNOSIS — E7800 Pure hypercholesterolemia, unspecified: Secondary | ICD-10-CM | POA: Diagnosis present

## 2020-08-16 DIAGNOSIS — F1110 Opioid abuse, uncomplicated: Secondary | ICD-10-CM | POA: Diagnosis present

## 2020-08-16 DIAGNOSIS — M7580 Other shoulder lesions, unspecified shoulder: Secondary | ICD-10-CM | POA: Insufficient documentation

## 2020-08-16 DIAGNOSIS — F1510 Other stimulant abuse, uncomplicated: Secondary | ICD-10-CM | POA: Diagnosis present

## 2020-08-16 DIAGNOSIS — L0390 Cellulitis, unspecified: Secondary | ICD-10-CM | POA: Insufficient documentation

## 2020-08-16 DIAGNOSIS — Z79899 Other long term (current) drug therapy: Secondary | ICD-10-CM

## 2020-08-16 DIAGNOSIS — I76 Septic arterial embolism: Secondary | ICD-10-CM | POA: Diagnosis present

## 2020-08-16 DIAGNOSIS — I10 Essential (primary) hypertension: Secondary | ICD-10-CM | POA: Diagnosis present

## 2020-08-16 DIAGNOSIS — Z8249 Family history of ischemic heart disease and other diseases of the circulatory system: Secondary | ICD-10-CM

## 2020-08-16 DIAGNOSIS — F419 Anxiety disorder, unspecified: Secondary | ICD-10-CM | POA: Diagnosis present

## 2020-08-16 LAB — COMPREHENSIVE METABOLIC PANEL
ALT: 16 U/L (ref 0–44)
AST: 24 U/L (ref 15–41)
Albumin: 3.1 g/dL — ABNORMAL LOW (ref 3.5–5.0)
Alkaline Phosphatase: 88 U/L (ref 38–126)
Anion gap: 8 (ref 5–15)
BUN: 7 mg/dL (ref 6–20)
CO2: 25 mmol/L (ref 22–32)
Calcium: 8.6 mg/dL — ABNORMAL LOW (ref 8.9–10.3)
Chloride: 103 mmol/L (ref 98–111)
Creatinine, Ser: 0.69 mg/dL (ref 0.61–1.24)
GFR, Estimated: >60 mL/min (ref >60)
Glucose, Bld: 112 mg/dL — ABNORMAL HIGH (ref 70–99)
Potassium: 4.2 mmol/L (ref 3.5–5.1)
Sodium: 136 mmol/L (ref 135–145)
Total Bilirubin: 0.7 mg/dL (ref 0.3–1.2)
Total Protein: 7.3 g/dL (ref 6.5–8.1)

## 2020-08-16 LAB — CBC WITH DIFFERENTIAL/PLATELET
Abs Immature Granulocytes: 0.08 10*3/uL — ABNORMAL HIGH (ref 0.00–0.07)
Basophils Absolute: 0.1 10*3/uL (ref 0.0–0.1)
Basophils Relative: 0 %
Eosinophils Absolute: 0.1 10*3/uL (ref 0.0–0.5)
Eosinophils Relative: 1 %
HCT: 37.6 % — ABNORMAL LOW (ref 39.0–52.0)
Hemoglobin: 12.6 g/dL — ABNORMAL LOW (ref 13.0–17.0)
Immature Granulocytes: 1 %
Lymphocytes Relative: 11 %
Lymphs Abs: 1.6 10*3/uL (ref 0.7–4.0)
MCH: 25.6 pg — ABNORMAL LOW (ref 26.0–34.0)
MCHC: 33.5 g/dL (ref 30.0–36.0)
MCV: 76.3 fL — ABNORMAL LOW (ref 80.0–100.0)
Monocytes Absolute: 1.2 10*3/uL — ABNORMAL HIGH (ref 0.1–1.0)
Monocytes Relative: 8 %
Neutro Abs: 11.9 10*3/uL — ABNORMAL HIGH (ref 1.7–7.7)
Neutrophils Relative %: 79 %
Platelets: 451 10*3/uL — ABNORMAL HIGH (ref 150–400)
RBC: 4.93 MIL/uL (ref 4.22–5.81)
RDW: 14.1 % (ref 11.5–15.5)
WBC: 14.9 10*3/uL — ABNORMAL HIGH (ref 4.0–10.5)
nRBC: 0 % (ref 0.0–0.2)

## 2020-08-16 LAB — APTT: aPTT: 24 seconds (ref 24–36)

## 2020-08-16 LAB — URINALYSIS, ROUTINE W REFLEX MICROSCOPIC
Bilirubin Urine: NEGATIVE
Glucose, UA: NEGATIVE mg/dL
Hgb urine dipstick: NEGATIVE
Ketones, ur: NEGATIVE mg/dL
Leukocytes,Ua: NEGATIVE
Nitrite: NEGATIVE
Protein, ur: NEGATIVE mg/dL
Specific Gravity, Urine: 1.01 (ref 1.005–1.030)
pH: 8 (ref 5.0–8.0)

## 2020-08-16 LAB — PROTIME-INR
INR: 1 (ref 0.8–1.2)
Prothrombin Time: 13.2 seconds (ref 11.4–15.2)

## 2020-08-16 LAB — RESP PANEL BY RT-PCR (FLU A&B, COVID) ARPGX2
Influenza A by PCR: NEGATIVE
Influenza B by PCR: NEGATIVE
SARS Coronavirus 2 by RT PCR: NEGATIVE

## 2020-08-16 LAB — LACTIC ACID, PLASMA: Lactic Acid, Venous: 1.2 mmol/L (ref 0.5–1.9)

## 2020-08-16 MED ORDER — IOHEXOL 300 MG/ML  SOLN
100.0000 mL | Freq: Once | INTRAMUSCULAR | Status: AC | PRN
  Administered 2020-08-16: 100 mL via INTRAVENOUS

## 2020-08-16 MED ORDER — HYDROMORPHONE HCL 1 MG/ML IJ SOLN
1.0000 mg | Freq: Once | INTRAMUSCULAR | Status: AC
  Administered 2020-08-16: 1 mg via INTRAMUSCULAR
  Filled 2020-08-16: qty 1

## 2020-08-16 MED ORDER — HYDROMORPHONE HCL 1 MG/ML IJ SOLN: 1.0000 mg | Freq: Once | INTRAMUSCULAR | Status: DC

## 2020-08-16 MED ORDER — LACTATED RINGERS IV BOLUS (SEPSIS)
1000.0000 mL | Freq: Once | INTRAVENOUS | Status: AC
  Administered 2020-08-16: 1000 mL via INTRAVENOUS

## 2020-08-16 NOTE — ED Notes (Signed)
ED Provider at bedside. 

## 2020-08-16 NOTE — ED Notes (Signed)
[facility] at bedside to transport patient to [facility] [place]. No acute distress noted.  

## 2020-08-16 NOTE — ED Triage Notes (Signed)
Fatigue, body aches, headache, sob, constipation and nausea for a week.

## 2020-08-16 NOTE — ED Notes (Signed)
Ultrasound IV attempted x2 by RRT without success. Dr. Madilyn Hook made aware.

## 2020-08-16 NOTE — ED Notes (Signed)
Pt c/o generalized body aches, also reports some constipation and left sided back pain. Highest temp at home 100.3

## 2020-08-16 NOTE — Progress Notes (Signed)
RT unable to obtain IV access. 

## 2020-08-16 NOTE — Progress Notes (Signed)
[facility] will assume care on arrival to accepting facility. Until arrival, care as per EDP. However, [facility] available 24/7 for questions and assistance.  

## 2020-08-16 NOTE — ED Notes (Signed)
Report given to [facility] RN at [facility] 4E

## 2020-08-16 NOTE — ED Provider Notes (Signed)
[facility] EMERGENCY DEPARTMENT Provider Note   CSN: [id number] Arrival date & time: 08/16/20  1610     History Chief Complaint  Patient presents with   URI    Logan Fisher is a 45 y.o. male.  The history is provided by the patient and medical records.  URI Logan Fisher is a 45 y.o. male who presents to the Emergency Department complaining of fever and body aches. He presents the emergency department complaining of feeling unwell starting last Tuesday. He reports subjective fevers with associated body aches, nausea. He has mild shortness of breath, cough. He has left-sided low back pain and constipation. 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 He currently is not on medications. No vomiting. Symptoms are moderate to severe, constant, worsening.    Past Medical History:  Diagnosis Date   AC (acromioclavicular) joint bone spurs    Cellulitis    Drug abuse (HCC)    Environmental allergies    Hiatal hernia    Hypercholesteremia    Hypertension     Patient Active Problem List   Diagnosis Date Noted   Septic embolism (HCC) 08/16/2020   AC (acromioclavicular) joint bone spurs 08/16/2020   Cellulitis 08/16/2020   Drug abuse (HCC) 08/16/2020   Hypercholesteremia 08/16/2020   Hiatal hernia 08/16/2020   Septic arthritis (HCC) 12/25/2018   IV drug abuse (HCC) 12/25/2018   Hypokalemia 12/25/2018    Past Surgical History:  Procedure Laterality Date   HERNIA REPAIR     IRRIGATION AND DEBRIDEMENT STERNOCLAVICULAR JOINT-STERNUM AND RIBS Left 01/24/2019       No family history on file.  Social History   Tobacco Use   Smoking status: Never   Smokeless tobacco: Never  Vaping Use   Vaping Use: Never used  Substance Use Topics   Alcohol use: Yes    Comment: weekly   Drug use: Yes    Types: Methamphetamines, IV, Cocaine    Comment: heroin and meth     Home  Medications Prior to Admission medications   Medication Sig Start Date End Date Taking? Authorizing Provider  clotrimazole (LOTRIMIN) 1 % cream APPLY TO THE AFFECTED AREA(S) TWICE DAILY 03/11/20 03/11/21  Pollyann Savoy, MD  doxycycline (VIBRAMYCIN) 100 MG capsule Take 1 capsule (100 mg total) by mouth 2 (two) times daily. 05/05/20   Horton, Mayer Masker, MD    Allergies    Patient has no known allergies.  Review of Systems   Review of Systems  All other systems reviewed and are negative.  Physical Exam Updated Vital Signs BP 129/84 (BP Location: Right Arm)   Pulse 84   Temp 98.8 F (37.1 C) (Oral)   Resp 16   Ht 6\' 5"  (1.956 m)   Wt 84 kg   SpO2 98%   BMI 21.96 kg/m   Physical Exam Vitals and nursing note reviewed.  Constitutional:      Appearance: He is well-developed.  HENT:     Head: Normocephalic and atraumatic.  Cardiovascular:     Rate and Rhythm: Regular rhythm. Tachycardia present.     Heart sounds: No murmur heard. Pulmonary:     Effort: Pulmonary effort is normal. No respiratory distress.     Breath sounds: Normal breath sounds.  Abdominal:     Palpations: Abdomen is soft.     Tenderness: There is no guarding or rebound.  Comments: Mild generalized abdominal tenderness  Musculoskeletal:        General: No tenderness.  Skin:    General: Skin is warm and dry.  Neurological:     Mental Status: He is alert and oriented to person, place, and time.     Comments: Five out of five strength in all four extremities  Psychiatric:        Behavior: Behavior normal.    ED Results / Procedures / Treatments   Labs (all labs ordered are listed, but only abnormal results are displayed) Labs Reviewed  COMPREHENSIVE METABOLIC PANEL - Abnormal; Notable for the following components:      Result Value   Glucose, Bld 112 (*)    Calcium 8.6 (*)    Albumin 3.1 (*)    All other components within normal limits  CBC WITH DIFFERENTIAL/PLATELET - Abnormal; Notable for the  following components:   WBC 14.9 (*)    Hemoglobin 12.6 (*)    HCT 37.6 (*)    MCV 76.3 (*)    MCH 25.6 (*)    Platelets 451 (*)    Neutro Abs 11.9 (*)    Monocytes Absolute 1.2 (*)    Abs Immature Granulocytes 0.08 (*)    All other components within normal limits  RESP PANEL BY RT-PCR (FLU A&B, COVID) ARPGX2  URINE CULTURE  CULTURE, BLOOD (ROUTINE X 2)  CULTURE, BLOOD (ROUTINE X 2)  LACTIC ACID, PLASMA  URINALYSIS, ROUTINE W REFLEX MICROSCOPIC  PROTIME-INR  APTT  LACTIC ACID, PLASMA    EKG EKG Interpretation  Date/Time:  Monday August 16 2020 18:41:21 EDT Ventricular Rate:  96 PR Interval:  154 QRS Duration: 97 QT Interval:  347 QTC Calculation: 439 R Axis:   115 Text Interpretation: Sinus rhythm Right axis deviation Borderline T abnormalities, inferior leads Baseline wander in lead(s) V2 Confirmed by Tilden Fossa [phone]) on 08/16/2020 6:47:09 PM  Radiology CT Abdomen Pelvis W Contrast  Result Date: 08/16/2020 CLINICAL DATA:  45 year old male with abdominal pain. History of IV drug use and osteomyelitis. EXAM: CT ABDOMEN AND PELVIS WITH CONTRAST TECHNIQUE: Multidetector CT imaging of the abdomen and pelvis was performed using the standard protocol following bolus administration of intravenous contrast. CONTRAST:  OMNIPAQUE IOHEXOL 300 MG/ML  SOLN COMPARISON:  None. FINDINGS: Lower chest: Several scattered bilateral pulmonary nodules suspicious for septic emboli in this patient with history of IV drug use. Clinical correlation is recommended. Chest CT may provide better evaluation if clinically indicated. No intra-abdominal free air or free fluid. Hepatobiliary: No focal liver abnormality is seen. No gallstones, gallbladder wall thickening, or biliary dilatation. Pancreas: Unremarkable. No pancreatic ductal dilatation or surrounding inflammatory changes. Spleen: Normal in size without focal abnormality. Adrenals/Urinary Tract: The adrenal glands unremarkable. The kidneys,  visualized ureters, and urinary bladder appear unremarkable. Stomach/Bowel: Postsurgical changes of Nissen fundoplication. Mild thickened appearance of the rectosigmoid, likely related to underdistention. Active inflammation is less likely. There is no bowel obstruction. The appendix is not visualized with certainty. No inflammatory changes identified in the right lower quadrant. Vascular/Lymphatic: Mild atherosclerotic calcification of the abdominal aorta. The IVC is unremarkable. No portal venous gas. There is no adenopathy. There is mild diffuse mesenteric stranding. Reproductive: The prostate and seminal vesicles are grossly unremarkable. Other: None Musculoskeletal: No acute or significant osseous findings. IMPRESSION: 1. Several scattered bilateral pulmonary nodules suspicious for septic emboli. 2. Mild thickened appearance of the rectosigmoid, likely related to underdistention. 3. No bowel obstruction. 4. Aortic Atherosclerosis (ICD10-I70.0). Electronically Signed  By: Elgie Collard M.D.   On: 08/16/2020 20:10   DG Chest Port 1 View  Result Date: 08/16/2020 CLINICAL DATA:  Questionable sepsis EXAM: PORTABLE CHEST 1 VIEW COMPARISON:  12/25/2018 FINDINGS: Heart and mediastinal contours are within normal limits. No focal opacities or effusions. No acute bony abnormality. IMPRESSION: No active disease. Electronically Signed   By: Charlett Nose M.D.   On: 08/16/2020 19:02    Procedures Procedures   Medications Ordered in ED Medications  lactated ringers bolus 1,000 mL (0 mLs Intravenous Stopped 08/16/20 2101)  iohexol (OMNIPAQUE) 300 MG/ML solution 100 mL (100 mLs Intravenous Contrast Given 08/16/20 1947)  HYDROmorphone (DILAUDID) injection 1 mg (1 mg Intramuscular Given 08/16/20 2135)    ED Course  I have reviewed the triage vital signs and the nursing notes.  Pertinent labs & imaging results that were available during my care of the patient were reviewed by me and considered in my medical  decision making (see chart for details).    MDM Rules/Calculators/A&P                         patient with history of IV drug abuse here for evaluation of one week of fevers, low back pain. On examination he is chronically ill appearing, tachycardic. CBC was leukocytosis. Lactic acid is within normal limits. CT abdomen pelvis was obtained, which is negative for retroperitoneal abscess but does demonstrate possible septic emboli in his lungs. Plan to admit for endocarditis workup. Will hold antibiotics until the patient's makes a fever and repeat cultures of that time. Medicine consulted for admission.   Final Clinical Impression(s) / ED Diagnoses Final diagnoses:  None    Rx / DC Orders ED Discharge Orders     None        Tilden Fossa, MD 08/16/20 2319

## 2020-08-17 ENCOUNTER — Inpatient Hospital Stay (HOSPITAL_COMMUNITY): Payer: Self-pay

## 2020-08-17 DIAGNOSIS — F1510 Other stimulant abuse, uncomplicated: Secondary | ICD-10-CM

## 2020-08-17 DIAGNOSIS — R7881 Bacteremia: Secondary | ICD-10-CM

## 2020-08-17 DIAGNOSIS — Z8619 Personal history of other infectious and parasitic diseases: Secondary | ICD-10-CM

## 2020-08-17 DIAGNOSIS — I079 Rheumatic tricuspid valve disease, unspecified: Secondary | ICD-10-CM

## 2020-08-17 DIAGNOSIS — F1190 Opioid use, unspecified, uncomplicated: Secondary | ICD-10-CM

## 2020-08-17 DIAGNOSIS — I76 Septic arterial embolism: Secondary | ICD-10-CM

## 2020-08-17 DIAGNOSIS — M5440 Lumbago with sciatica, unspecified side: Secondary | ICD-10-CM

## 2020-08-17 DIAGNOSIS — I10 Essential (primary) hypertension: Secondary | ICD-10-CM

## 2020-08-17 DIAGNOSIS — I2690 Septic pulmonary embolism without acute cor pulmonale: Secondary | ICD-10-CM

## 2020-08-17 DIAGNOSIS — F419 Anxiety disorder, unspecified: Secondary | ICD-10-CM

## 2020-08-17 DIAGNOSIS — E785 Hyperlipidemia, unspecified: Secondary | ICD-10-CM

## 2020-08-17 DIAGNOSIS — A498 Other bacterial infections of unspecified site: Secondary | ICD-10-CM

## 2020-08-17 LAB — BLOOD CULTURE ID PANEL (REFLEXED) - BCID2
A.calcoaceticus-baumannii: NOT DETECTED
A.calcoaceticus-baumannii: NOT DETECTED
Bacteroides fragilis: NOT DETECTED
Bacteroides fragilis: NOT DETECTED
CTX-M ESBL: NOT DETECTED
CTX-M ESBL: NOT DETECTED
Candida albicans: NOT DETECTED
Candida albicans: NOT DETECTED
Candida auris: NOT DETECTED
Candida auris: NOT DETECTED
Candida glabrata: NOT DETECTED
Candida glabrata: NOT DETECTED
Candida krusei: NOT DETECTED
Candida krusei: NOT DETECTED
Candida parapsilosis: NOT DETECTED
Candida parapsilosis: NOT DETECTED
Candida tropicalis: NOT DETECTED
Candida tropicalis: NOT DETECTED
Carbapenem resist OXA 48 LIKE: NOT DETECTED
Carbapenem resist OXA 48 LIKE: NOT DETECTED
Carbapenem resistance IMP: NOT DETECTED
Carbapenem resistance IMP: NOT DETECTED
Carbapenem resistance KPC: NOT DETECTED
Carbapenem resistance KPC: NOT DETECTED
Carbapenem resistance NDM: NOT DETECTED
Carbapenem resistance NDM: NOT DETECTED
Carbapenem resistance VIM: NOT DETECTED
Carbapenem resistance VIM: NOT DETECTED
Cryptococcus neoformans/gattii: NOT DETECTED
Cryptococcus neoformans/gattii: NOT DETECTED
Enterobacter cloacae complex: NOT DETECTED
Enterobacter cloacae complex: NOT DETECTED
Enterobacterales: DETECTED — AB
Enterobacterales: DETECTED — AB
Enterococcus Faecium: NOT DETECTED
Enterococcus Faecium: NOT DETECTED
Enterococcus faecalis: NOT DETECTED
Enterococcus faecalis: NOT DETECTED
Escherichia coli: NOT DETECTED
Escherichia coli: NOT DETECTED
Haemophilus influenzae: NOT DETECTED
Haemophilus influenzae: NOT DETECTED
Klebsiella aerogenes: NOT DETECTED
Klebsiella aerogenes: NOT DETECTED
Klebsiella oxytoca: NOT DETECTED
Klebsiella oxytoca: NOT DETECTED
Klebsiella pneumoniae: NOT DETECTED
Klebsiella pneumoniae: NOT DETECTED
Listeria monocytogenes: NOT DETECTED
Listeria monocytogenes: NOT DETECTED
Neisseria meningitidis: NOT DETECTED
Neisseria meningitidis: NOT DETECTED
Proteus species: NOT DETECTED
Proteus species: NOT DETECTED
Pseudomonas aeruginosa: NOT DETECTED
Pseudomonas aeruginosa: NOT DETECTED
Salmonella species: NOT DETECTED
Salmonella species: NOT DETECTED
Serratia marcescens: DETECTED — AB
Serratia marcescens: DETECTED — AB
Staphylococcus aureus (BCID): NOT DETECTED
Staphylococcus aureus (BCID): NOT DETECTED
Staphylococcus epidermidis: NOT DETECTED
Staphylococcus epidermidis: NOT DETECTED
Staphylococcus lugdunensis: NOT DETECTED
Staphylococcus lugdunensis: NOT DETECTED
Staphylococcus species: NOT DETECTED
Staphylococcus species: NOT DETECTED
Stenotrophomonas maltophilia: NOT DETECTED
Stenotrophomonas maltophilia: NOT DETECTED
Streptococcus agalactiae: NOT DETECTED
Streptococcus agalactiae: NOT DETECTED
Streptococcus pneumoniae: NOT DETECTED
Streptococcus pneumoniae: NOT DETECTED
Streptococcus pyogenes: NOT DETECTED
Streptococcus pyogenes: NOT DETECTED
Streptococcus species: NOT DETECTED
Streptococcus species: NOT DETECTED

## 2020-08-17 LAB — ECHOCARDIOGRAM COMPLETE
AR max vel: 5.22 cm2
AV Area VTI: 4.78 cm2
AV Area mean vel: 4.77 cm2
AV Mean grad: 4 mmHg
AV Peak grad: 7.3 mmHg
Ao pk vel: 1.35 m/s
Area-P 1/2: 4.71 cm2
Calc EF: 40.2 %
Height: 77 in
MV VTI: 5.89 cm2
S' Lateral: 4 cm
Single Plane A2C EF: 36.8 %
Single Plane A4C EF: 44.4 %
Weight: 2962.98 oz

## 2020-08-17 LAB — BASIC METABOLIC PANEL
Anion gap: 10 (ref 5–15)
BUN: 6 mg/dL (ref 6–20)
CO2: 24 mmol/L (ref 22–32)
Calcium: 8.9 mg/dL (ref 8.9–10.3)
Chloride: 102 mmol/L (ref 98–111)
Creatinine, Ser: 0.78 mg/dL (ref 0.61–1.24)
GFR, Estimated: >60 mL/min (ref >60)
Glucose, Bld: 116 mg/dL — ABNORMAL HIGH (ref 70–99)
Potassium: 3.6 mmol/L (ref 3.5–5.1)
Sodium: 136 mmol/L (ref 135–145)

## 2020-08-17 LAB — CBC
HCT: 37.6 % — ABNORMAL LOW (ref 39.0–52.0)
Hemoglobin: 12.4 g/dL — ABNORMAL LOW (ref 13.0–17.0)
MCH: 25.8 pg — ABNORMAL LOW (ref 26.0–34.0)
MCHC: 33 g/dL (ref 30.0–36.0)
MCV: 78.3 fL — ABNORMAL LOW (ref 80.0–100.0)
Platelets: 430 10*3/uL — ABNORMAL HIGH (ref 150–400)
RBC: 4.8 MIL/uL (ref 4.22–5.81)
RDW: 14.3 % (ref 11.5–15.5)
WBC: 16 10*3/uL — ABNORMAL HIGH (ref 4.0–10.5)
nRBC: 0 % (ref 0.0–0.2)

## 2020-08-17 LAB — LIPID PANEL
Cholesterol: 119 mg/dL (ref 0–200)
HDL: 24 mg/dL — ABNORMAL LOW (ref >40)
LDL Cholesterol: 82 mg/dL (ref 0–99)
Total CHOL/HDL Ratio: 5 RATIO
Triglycerides: 65 mg/dL (ref <150)
VLDL: 13 mg/dL (ref 0–40)

## 2020-08-17 LAB — PROTIME-INR
INR: 1.1 (ref 0.8–1.2)
Prothrombin Time: 13.8 seconds (ref 11.4–15.2)

## 2020-08-17 LAB — CORTISOL-AM, BLOOD: Cortisol - AM: 12.5 ug/dL (ref 6.7–22.6)

## 2020-08-17 LAB — LACTIC ACID, PLASMA: Lactic Acid, Venous: 0.7 mmol/L (ref 0.5–1.9)

## 2020-08-17 LAB — C-REACTIVE PROTEIN: CRP: 11.6 mg/dL — ABNORMAL HIGH (ref <1.0)

## 2020-08-17 LAB — PROCALCITONIN: Procalcitonin: 0.59 ng/mL

## 2020-08-17 LAB — SEDIMENTATION RATE: Sed Rate: 57 mm/hr — ABNORMAL HIGH (ref 0–16)

## 2020-08-17 MED ORDER — ONDANSETRON HCL 4 MG PO TABS
4.0000 mg | ORAL_TABLET | Freq: Four times a day (QID) | ORAL | Status: DC | PRN
Start: 2020-08-17 — End: 2020-08-21

## 2020-08-17 MED ORDER — VANCOMYCIN HCL 1000 MG/200ML IV SOLN: 1000.0000 mg | Freq: Once | INTRAVENOUS | Status: DC

## 2020-08-17 MED ORDER — VANCOMYCIN HCL 1500 MG/300ML IV SOLN
1500.0000 mg | Freq: Once | INTRAVENOUS | Status: AC
  Administered 2020-08-17: 1500 mg via INTRAVENOUS
  Filled 2020-08-17: qty 300

## 2020-08-17 MED ORDER — MAGNESIUM HYDROXIDE 400 MG/5ML PO SUSP
30.0000 mL | Freq: Every day | ORAL | Status: DC | PRN
  Administered 2020-08-19: 30 mL via ORAL
  Filled 2020-08-17: qty 30

## 2020-08-17 MED ORDER — ACETAMINOPHEN 650 MG RE SUPP: 650.0000 mg | Freq: Four times a day (QID) | RECTAL | Status: DC | PRN

## 2020-08-17 MED ORDER — SODIUM CHLORIDE 0.9 % IV SOLN
2.0000 g | Freq: Once | INTRAVENOUS | Status: AC
  Administered 2020-08-17: 2 g via INTRAVENOUS
  Filled 2020-08-17: qty 2

## 2020-08-17 MED ORDER — VANCOMYCIN HCL 2000 MG/400ML IV SOLN
2000.0000 mg | Freq: Two times a day (BID) | INTRAVENOUS | Status: DC
  Filled 2020-08-17: qty 400

## 2020-08-17 MED ORDER — HYDROMORPHONE HCL 1 MG/ML IJ SOLN
1.0000 mg | INTRAMUSCULAR | Status: DC | PRN
  Administered 2020-08-17 (×2): 2 mg via INTRAVENOUS
  Administered 2020-08-17: 1 mg via INTRAVENOUS
  Administered 2020-08-18 – 2020-08-21 (×17): 2 mg via INTRAVENOUS
  Filled 2020-08-17: qty 2
  Filled 2020-08-17: qty 1
  Filled 2020-08-17 (×17): qty 2

## 2020-08-17 MED ORDER — ALPRAZOLAM 0.25 MG PO TABS
0.2500 mg | ORAL_TABLET | Freq: Three times a day (TID) | ORAL | Status: DC | PRN
  Administered 2020-08-17 – 2020-08-21 (×5): 0.25 mg via ORAL
  Filled 2020-08-17 (×5): qty 1

## 2020-08-17 MED ORDER — ACETAMINOPHEN 325 MG PO TABS: 650.0000 mg | ORAL_TABLET | Freq: Four times a day (QID) | ORAL | Status: DC | PRN

## 2020-08-17 MED ORDER — TRAZODONE HCL 50 MG PO TABS: 25.0000 mg | ORAL_TABLET | Freq: Every evening | ORAL | Status: DC | PRN

## 2020-08-17 MED ORDER — METRONIDAZOLE 500 MG/100ML IV SOLN
500.0000 mg | Freq: Three times a day (TID) | INTRAVENOUS | Status: DC
Start: 2020-08-17 — End: 2020-08-17
  Administered 2020-08-17 (×2): 500 mg via INTRAVENOUS
  Filled 2020-08-17 (×2): qty 100

## 2020-08-17 MED ORDER — ENOXAPARIN SODIUM 40 MG/0.4ML IJ SOSY
40.0000 mg | PREFILLED_SYRINGE | INTRAMUSCULAR | Status: DC
  Administered 2020-08-17 – 2020-08-21 (×5): 40 mg via SUBCUTANEOUS
  Filled 2020-08-17 (×5): qty 0.4

## 2020-08-17 MED ORDER — SODIUM CHLORIDE 0.9 % IV SOLN
2.0000 g | Freq: Three times a day (TID) | INTRAVENOUS | Status: DC
  Administered 2020-08-17 – 2020-08-21 (×13): 2 g via INTRAVENOUS
  Filled 2020-08-17 (×14): qty 2

## 2020-08-17 MED ORDER — HYDROMORPHONE HCL 1 MG/ML IJ SOLN
1.0000 mg | INTRAMUSCULAR | Status: DC | PRN
  Administered 2020-08-17 (×3): 1 mg via INTRAVENOUS
  Filled 2020-08-17 (×3): qty 1

## 2020-08-17 MED ORDER — LABETALOL HCL 5 MG/ML IV SOLN: 20.0000 mg | INTRAVENOUS | Status: DC | PRN

## 2020-08-17 MED ORDER — SODIUM CHLORIDE 0.9 % IV SOLN: INTRAVENOUS | Status: DC

## 2020-08-17 MED ORDER — ONDANSETRON HCL 4 MG/2ML IJ SOLN
4.0000 mg | Freq: Four times a day (QID) | INTRAMUSCULAR | Status: DC | PRN
  Administered 2020-08-19: 4 mg via INTRAVENOUS
  Filled 2020-08-17: qty 2

## 2020-08-17 NOTE — Progress Notes (Signed)
PHARMACY - PHYSICIAN COMMUNICATION CRITICAL VALUE ALERT - BLOOD CULTURE IDENTIFICATION (BCID)  Logan Fisher is an 45 y.o. male who presented to [facility] on 08/16/2020 with a chief complaint of septic embolism  Assessment:  2 of 4 bottles from blood cultures growing serratia marcescens  Name of physician (or Provider) Contacted: [place]  Current antibiotics: cefepime/vanc/flagyl  Changes to prescribed antibiotics recommended:  Md will get ID consult and wants to know MRI results to rule out osteo, etc - continue current abx's for now  No results found for this or any previous visit.  Berkley Harvey 08/17/2020  2:23 PM

## 2020-08-17 NOTE — H&P (Addendum)
PATIENT NAME: Logan Fisher    MR#:  [id number]  DATE OF BIRTH:  1976/01/07  DATE OF ADMISSION:  08/16/2020  PRIMARY CARE PHYSICIAN: Roger Kill, PA-C   Patient is coming from: Home  REQUESTING/REFERRING PHYSICIAN: Tilden Fossa, MD   CHIEF COMPLAINT:   Chief Complaint  Patient presents with  . URI    HISTORY OF PRESENT ILLNESS:  Logan Fisher is a 45 y.o. Caucasian male with medical history significant for hypertension, dyslipidemia and hiatal hernia, as well as IV drug abuse, who presented emergency room with acute onset excessive fatigue and tiredness with associated back pain as well as dry cough with dyspnea without wheezing.  He admitted to fever and chills but denied any chest pain.  He admits to chest tightness though.  No leg pain or edema recent travels or surgeries.  No diarrhea or nausea or vomiting or abdominal pain.  He denies any dysuria, oliguria or hematuria or flank pain.  ED Course: Upon arrival to ER blood pressure was 143/98 with otherwise normal vital signs.  Earlier he was tachypneic with a rate of 26 and MCHP and heart rate was 99..  Labs revealed unremarkable CMP except for albumin of 3.1.  CBC showed leukocytosis of 14.9 with neutrophilia as well as mild anemia.  Once antigens and COVID-19 PCR came back negative.  UA was unremarkable.  Blood cultures were drawn as well as urine culture.   EKG as reviewed by me : Showed normal sinus rhythm with rate of 96 with right axis deviation Q waves in V1 and T wave inversion inferiorly Imaging: Portable chest ray showed no acute cardiopulmonary disease.  Abdominal pelvic CT scan revealed: 1. Several scattered bilateral pulmonary nodules suspicious for septic emboli. 2. Mild thickened appearance of the rectosigmoid, likely related to underdistention. 3. No bowel obstruction. 4. Aortic Atherosclerosis.  The patient was given 1 mg of IV Dilaudid and 1 L of IV lactated Ringer.  I ordered  IV vancomycin and cefepime and Flagyl after blood cultures.  The patient is admitted to a telemetry bed for further evaluation and management. PAST MEDICAL HISTORY:   Past Medical History:  Diagnosis Date  . AC (acromioclavicular) joint bone spurs   . Cellulitis   . Drug abuse (HCC)   . Environmental allergies   . Hiatal hernia   . Hypercholesteremia   . Hypertension   History of heroin and methamphetamine abuse.  PAST SURGICAL HISTORY:   Past Surgical History:  Procedure Laterality Date  . HERNIA REPAIR    . IRRIGATION AND DEBRIDEMENT STERNOCLAVICULAR JOINT-STERNUM AND RIBS Left 01/24/2019    SOCIAL HISTORY:   Social History   Tobacco Use  . Smoking status: Never  . Smokeless tobacco: Never  Substance Use Topics  . Alcohol use: Yes    Comment: weekly  Has a history of IV drug abuse.  FAMILY HISTORY:  Positive for diabetes mellitus and hypertension in his father.  DRUG ALLERGIES:  No Known Allergies  REVIEW OF SYSTEMS:   ROS As per history of present illness. All pertinent systems were reviewed above. Constitutional, HEENT, cardiovascular, respiratory, GI, GU, musculoskeletal, neuro, psychiatric, endocrine, integumentary and hematologic systems were reviewed and are otherwise negative/unremarkable except for positive findings mentioned above in the HPI.   MEDICATIONS AT HOME:   Prior to Admission medications   Not on File      VITAL SIGNS:  Blood pressure (!) 143/98, pulse 88, temperature 98.4 F (36.9 C),  temperature source Oral, resp. rate 20, height 6\' 5"  (1.956 m), weight 84 kg, SpO2 99 %.  PHYSICAL EXAMINATION:  Physical Exam  GENERAL:  45 y.o.-year-old Caucasian male patient lying in the bed with no acute distress.  EYES: Pupils equal, round, reactive to light and accommodation. No scleral icterus. Extraocular muscles intact.  HEENT: Head atraumatic, normocephalic. Oropharynx and nasopharynx clear.  NECK:  Supple, no jugular venous distention. No  thyroid enlargement, no tenderness.  LUNGS: Slight diminished bibasal breath sounds with bibasal crackles.  CARDIOVASCULAR: Regular rate and rhythm, S1, S2 normal. No murmurs, rubs, or gallops.  ABDOMEN: Soft, nondistended, nontender. Bowel sounds present. No organomegaly or mass.  EXTREMITIES: No pedal edema, cyanosis, or clubbing.  NEUROLOGIC: Cranial nerves II through XII are intact. Muscle strength 5/5 in all extremities. Sensation intact. Gait not checked.  PSYCHIATRIC: The patient is alert and oriented x 3.  Normal affect and good eye contact. SKIN: No obvious rash, lesion, or ulcer.   LABORATORY PANEL:   CBC Recent Labs  Lab 08/16/20 1812  WBC 14.9*  HGB 12.6*  HCT 37.6*  PLT 451*   ------------------------------------------------------------------------------------------------------------------  Chemistries  Recent Labs  Lab 08/16/20 1812  NA 136  K 4.2  CL 103  CO2 25  GLUCOSE 112*  BUN 7  CREATININE 0.69  CALCIUM 8.6*  AST 24  ALT 16  ALKPHOS 88  BILITOT 0.7   ------------------------------------------------------------------------------------------------------------------  Cardiac Enzymes No results for input(s): TROPONINI in the last 168 hours. ------------------------------------------------------------------------------------------------------------------  RADIOLOGY:  CT Abdomen Pelvis W Contrast  Result Date: 08/16/2020 CLINICAL DATA:  45 year old male with abdominal pain. History of IV drug use and osteomyelitis. EXAM: CT ABDOMEN AND PELVIS WITH CONTRAST TECHNIQUE: Multidetector CT imaging of the abdomen and pelvis was performed using the standard protocol following bolus administration of intravenous contrast. CONTRAST:  57 OMNIPAQUE IOHEXOL 300 MG/ML  SOLN COMPARISON:  None. FINDINGS: Lower chest: Several scattered bilateral pulmonary nodules suspicious for septic emboli in this patient with history of IV drug use. Clinical correlation is  recommended. Chest CT may provide better evaluation if clinically indicated. No intra-abdominal free air or free fluid. Hepatobiliary: No focal liver abnormality is seen. No gallstones, gallbladder wall thickening, or biliary dilatation. Pancreas: Unremarkable. No pancreatic ductal dilatation or surrounding inflammatory changes. Spleen: Normal in size without focal abnormality. Adrenals/Urinary Tract: The adrenal glands unremarkable. The kidneys, visualized ureters, and urinary bladder appear unremarkable. Stomach/Bowel: Postsurgical changes of Nissen fundoplication. Mild thickened appearance of the rectosigmoid, likely related to underdistention. Active inflammation is less likely. There is no bowel obstruction. The appendix is not visualized with certainty. No inflammatory changes identified in the right lower quadrant. Vascular/Lymphatic: Mild atherosclerotic calcification of the abdominal aorta. The IVC is unremarkable. No portal venous gas. There is no adenopathy. There is mild diffuse mesenteric stranding. Reproductive: The prostate and seminal vesicles are grossly unremarkable. Other: None Musculoskeletal: No acute or significant osseous findings. IMPRESSION: 1. Several scattered bilateral pulmonary nodules suspicious for septic emboli. 2. Mild thickened appearance of the rectosigmoid, likely related to underdistention. 3. No bowel obstruction. 4. Aortic Atherosclerosis (ICD10-I70.0). Electronically Signed   By: M.D.   On: 08/16/2020 20:10   DG Chest Port 1 View  Result Date: 08/16/2020 CLINICAL DATA:  Questionable sepsis EXAM: PORTABLE CHEST 1 VIEW COMPARISON:  12/25/2018 FINDINGS: Heart and mediastinal contours are within normal limits. No focal opacities or effusions. No acute bony abnormality. IMPRESSION: No active disease. Electronically Signed   By: 12/27/2018 M.D.   On:  08/16/2020 19:02      IMPRESSION AND PLAN:  Active Problems:   Septic embolism (HCC)  1.  Septic lung  Emboli.  He has leukocytosis, tachycardia and tachypnea. - The patient will be admitted to a telemetry bed. - We will follow blood and sputum cultures. - We will obtain a 2D echo for further assessment for vegetations. - We will place on broad-spectrum IV antibiotic therapy with vancomycin, cefepime and Flagyl.  2.  Essential hypertension. - We will be placed on as needed IV labetalol.  3.  Dyslipidemia. - We will check fasting lipid profile.  4.  Anxiety. - We will place him on as needed Xanax.  DVT prophylaxis: Lovenox.  Code Status: full code.  Family Communication:  The plan of care was discussed in details with the patient (and family). I answered all questions. The patient agreed to proceed with the above mentioned plan. Further management will depend upon hospital course. Disposition Plan: Back to previous home environment Consults called: none.  All the records are reviewed and case discussed with ED provider.  Status is: Inpatient  Remains inpatient appropriate because:Ongoing active pain requiring inpatient pain management, Ongoing diagnostic testing needed not appropriate for outpatient work up, Unsafe d/c plan, IV treatments appropriate due to intensity of illness or inability to take PO, and Inpatient level of care appropriate due to severity of illness  Dispo: The patient is from: Home              Anticipated d/c is to: Home              Patient currently is not medically stable to d/c.   Difficult to place patient No   TOTAL TIME TAKING CARE OF THIS PATIENT:55 minutes.    Hannah Beat M.D on 08/17/2020 at 1:07 AM  [facility]   From 7 PM-7 AM, contact night-coverage [email or web address]  CC: Primary care physician; Roger Kill, PA-C

## 2020-08-17 NOTE — Progress Notes (Signed)
Pharmacy Antibiotic Note  Logan Fisher is a 45 y.o. male admitted on 08/16/2020 with fever and body aches.  Pt has a hx of IV drug use with heroin and methamphetamine, aslo has a hx of osteomyelitis of the clavicle  Pharmacy has been consulted for dosing vancomycin and cefepime  Plan: Vancomycin 1.5gm IV x 1 then 2gm q12h (AUC 490.2, Scr 0.69 TBW) Cefepime 2gm IV q8h Flagyl 500mg  IV q8h per MD Follow renal function, cultures and clinical course  Height: 6\' 5"  (195.6 cm) Weight: 84 kg (185 lb 3 oz) IBW/kg (Calculated) : 89.1  Temp (24hrs), Avg:98.6 F (37 C), Min:98.4 F (36.9 C), Max:98.8 F (37.1 C)  Recent Labs  Lab 08/16/20 1812  WBC 14.9*  CREATININE 0.69  LATICACIDVEN 1.2    Estimated Creatinine Clearance: 135.6 mL/min (by C-G formula based on SCr of 0.69 mg/dL).    No Known Allergies  Antimicrobials this admission: Vancomycin 6/14 >> Cefepime 6/14 >> Flagyl  6/14 >>  Dose adjustments this admission:  Microbiology results: 6/13 BCx 6/13 UCx:     Thank you for allowing pharmacy to be a part of this patient's care.  7/13 RPh 08/17/2020, 1:09 AM

## 2020-08-17 NOTE — Consult Note (Signed)
[facility]  Total days of antibiotics 2 cefepime       Reason for Consult: serratia bacteremia/pulmonary septic emboli   Referring Physician: regalado  Active Problems:   Septic embolism (HCC)    HPI: DEANTAE SHACKLETON is a 45 y.o. male with hx of HTN, hx of MSSA sternal osteo s/p surgical debridement, active injection drug use with meth/heroin admitted for feeling poorly and chest tightness/pleurisy. On admit found to have leukocytosis of 16K, CT imaging consistent with pulm septic emboli. Infectious work up showing serratia bacteremia. He reports injecting 3-5 times per day. Using sterile water most of the time, and clean new needle with each use, supplies from needle exchange. He denies any injection site becoming inflamed. Now having new onset back spasm and back pain  Recent left retinal detachment surgery in April  Planning to go back to [place] in 1 month to possibly go to rehab  Earlier in the year + STI and syphilis  Past Medical History:  Diagnosis Date   AC (acromioclavicular) joint bone spurs    Cellulitis    Drug abuse (HCC)    Environmental allergies    Hiatal hernia    Hypercholesteremia    Hypertension     Allergies: No Known Allergies  MEDICATIONS:  enoxaparin (LOVENOX) injection  40 mg Subcutaneous Q24H    Social History   Tobacco Use   Smoking status: Never   Smokeless tobacco: Never  Vaping Use   Vaping Use: Never used  Substance Use Topics   Alcohol use: Yes    Comment: weekly   Drug use: Yes    Types: Methamphetamines, IV, Cocaine    Comment: heroin and meth     No family history on file.   Review of Systems  Constitutional: positivefor fever, chills, diaphoresis, activity change, appetite change, fatigue and unexpected weight change.  HENT: Negative for congestion, sore throat, rhinorrhea, sneezing, trouble swallowing and sinus pressure.  Eyes: Negative for photophobia and visual disturbance.  Respiratory:  Negative for cough, chest tightness, shortness of breath, wheezing and stridor.  Cardiovascular: Negative for chest pain, palpitations and leg swelling.  Gastrointestinal: Negative for nausea, vomiting, abdominal pain, diarrhea, constipation, blood in stool, abdominal distention and anal bleeding.  Genitourinary: Negative for dysuria, hematuria, flank pain and difficulty urinating.  Musculoskeletal: +back pain. Negative for myalgias, back pain, joint swelling, arthralgias and gait problem.  Skin: Negative for color change, pallor, rash and wound.  Neurological: Negative for dizziness, tremors, weakness and light-headedness.  Hematological: Negative for adenopathy. Does not bruise/bleed easily.  Psychiatric/Behavioral: Negative for behavioral problems, confusion, sleep disturbance, dysphoric mood, decreased concentration and agitation.    OBJECTIVE: Temp:  [98.1 F (36.7 C)-98.8 F (37.1 C)] 98.1 F (36.7 C) (06/14 1132) Pulse Rate:  [83-93] 92 (06/14 1132) Resp:  [16-21] 19 (06/14 1132) BP: (125-150)/(79-98) 125/85 (06/14 1132) SpO2:  [97 %-100 %] 98 % (06/14 1132) Physical Exam  Constitutional: He is oriented to person, place, and time. He appears well-developed and well-nourished. No distress.  HENT:  Mouth/Throat: Oropharynx is clear and moist. No oropharyngeal exudate.  Cardiovascular: Normal rate, regular rhythm and normal heart sounds. Exam reveals no gallop and no friction rub.  No murmur heard.  Pulmonary/Chest: Effort normal and breath sounds normal. No respiratory distress. He has no wheezes.  Abdominal: Soft. Bowel sounds are normal. He exhibits no distension. There is no tenderness.  Lymphadenopathy:  He has no cervical adenopathy.  Neurological: He is alert and oriented to  person, place, and time.  Skin: Skin is warm and dry. Numerous scars on arms predominantly from injection site Psychiatric: He has a normal mood and affect. His behavior is normal.    LABS: Results  for orders placed or performed during the hospital encounter of 08/16/20 (from the past 48 hour(s))  Resp Panel by RT-PCR (Flu A&B, Covid) Nasopharyngeal Swab     Status: None   Collection Time: 08/16/20  6:12 PM   Specimen: Nasopharyngeal Swab; Nasopharyngeal(NP) swabs in vial transport medium  Result Value Ref Range   SARS Coronavirus 2 by RT PCR NEGATIVE NEGATIVE    Comment: (NOTE) SARS-CoV-2 target nucleic acids are NOT DETECTED.  The SARS-CoV-2 RNA is generally detectable in upper respiratory specimens during the acute phase of infection. The lowest concentration of SARS-CoV-2 viral copies this assay can detect is 138 copies/mL. A negative result does not preclude SARS-Cov-2 infection and should not be used as the sole basis for treatment or other patient management decisions. A negative result may occur with  improper specimen collection/handling, submission of specimen other than nasopharyngeal swab, presence of viral mutation(s) within the areas targeted by this assay, and inadequate number of viral copies(<138 copies/mL). A negative result must be combined with clinical observations, patient history, and epidemiological information. The expected result is Negative.  Fact Sheet for Patients:  BloggerCourse.com  Fact Sheet for Healthcare Providers:  SeriousBroker.it  This test is no t yet approved or cleared by the [place] FDA and  has been authorized for detection and/or diagnosis of SARS-CoV-2 by FDA under an Emergency Use Authorization (EUA). This EUA will remain  in effect (meaning this test can be used) for the duration of the COVID-19 declaration under Section 564(b)(1) of the Act, 21 U.S.C.section 360bbb-3(b)(1), unless the authorization is terminated  or revoked sooner.       Influenza A by PCR NEGATIVE NEGATIVE   Influenza B by PCR NEGATIVE NEGATIVE    Comment: (NOTE) The Xpert Xpress SARS-CoV-2/FLU/RSV  plus assay is intended as an aid in the diagnosis of influenza from Nasopharyngeal swab specimens and should not be used as a sole basis for treatment. Nasal washings and aspirates are unacceptable for Xpert Xpress SARS-CoV-2/FLU/RSV testing.  Fact Sheet for Patients: BloggerCourse.com  Fact Sheet for Healthcare Providers: SeriousBroker.it  This test is not yet approved or cleared by the [place] FDA and has been authorized for detection and/or diagnosis of SARS-CoV-2 by FDA under an Emergency Use Authorization (EUA). This EUA will remain in effect (meaning this test can be used) for the duration of the COVID-19 declaration under Section 564(b)(1) of the Act, 21 U.S.C. section 360bbb-3(b)(1), unless the authorization is terminated or revoked.  Performed at [facility], [address] [place], [place] [address]   Lactic acid, plasma     Status: None   Collection Time: 08/16/20  6:12 PM  Result Value Ref Range   Lactic Acid, Venous 1.2 0.5 - 1.9 mmol/L    Comment: Performed at [facility], [address] [place], [place] [address]  Comprehensive metabolic panel     Status: Abnormal   Collection Time: 08/16/20  6:12 PM  Result Value Ref Range   Sodium 136 135 - 145 mmol/L   Potassium 4.2 3.5 - 5.1 mmol/L   Chloride 103 98 - 111 mmol/L   CO2 25 22 - 32 mmol/L   Glucose, Bld 112 (H) 70 - 99 mg/dL    Comment: Glucose reference range applies  only to samples taken after fasting for at least 8 hours.   BUN 7 6 - 20 mg/dL   Creatinine, Ser 1.61 0.61 - 1.24 mg/dL   Calcium 8.6 (L) 8.9 - 10.3 mg/dL   Total Protein 7.3 6.5 - 8.1 g/dL   Albumin 3.1 (L) 3.5 - 5.0 g/dL   AST 24 15 - 41 U/L   ALT 16 0 - 44 U/L   Alkaline Phosphatase 88 38 - 126 U/L   Total Bilirubin 0.7 0.3 - 1.2 mg/dL   GFR, Estimated >09 >60 mL/min    Comment: (NOTE) Calculated using the CKD-EPI Creatinine Equation (2021)    Anion gap 8  5 - 15    Comment: Performed at [facility], [address] [place], [place] [address]  CBC WITH DIFFERENTIAL     Status: Abnormal   Collection Time: 08/16/20  6:12 PM  Result Value Ref Range   WBC 14.9 (H) 4.0 - 10.5 K/uL   RBC 4.93 4.22 - 5.81 MIL/uL   Hemoglobin 12.6 (L) 13.0 - 17.0 g/dL   HCT 81.1 (L) 91.4 - 78.2 %   MCV 76.3 (L) 80.0 - 100.0 fL   MCH 25.6 (L) 26.0 - 34.0 pg   MCHC 33.5 30.0 - 36.0 g/dL   RDW 95.6 21.3 - 08.6 %   Platelets 451 (H) 150 - 400 K/uL   nRBC 0.0 0.0 - 0.2 %   Neutrophils Relative % 79 %   Neutro Abs 11.9 (H) 1.7 - 7.7 K/uL   Lymphocytes Relative 11 %   Lymphs Abs 1.6 0.7 - 4.0 K/uL   Monocytes Relative 8 %   Monocytes Absolute 1.2 (H) 0.1 - 1.0 K/uL   Eosinophils Relative 1 %   Eosinophils Absolute 0.1 0.0 - 0.5 K/uL   Basophils Relative 0 %   Basophils Absolute 0.1 0.0 - 0.1 K/uL   Immature Granulocytes 1 %   Abs Immature Granulocytes 0.08 (H) 0.00 - 0.07 K/uL    Comment: Performed at [facility], [address] [place], [place] [address]  Urinalysis, Routine w reflex microscopic Urine, Clean Catch     Status: None   Collection Time: 08/16/20  6:12 PM  Result Value Ref Range   Color, Urine YELLOW YELLOW   APPearance CLEAR CLEAR   Specific Gravity, Urine 1.010 1.005 - 1.030   pH 8.0 5.0 - 8.0   Glucose, UA NEGATIVE NEGATIVE mg/dL   Hgb urine dipstick NEGATIVE NEGATIVE   Bilirubin Urine NEGATIVE NEGATIVE   Ketones, ur NEGATIVE NEGATIVE mg/dL   Protein, ur NEGATIVE NEGATIVE mg/dL   Nitrite NEGATIVE NEGATIVE   Leukocytes,Ua NEGATIVE NEGATIVE    Comment: Microscopic not done on urines with negative protein, blood, leukocytes, nitrite, or glucose < 500 mg/dL. Performed at [facility], [address] [place], [place] [address]   Blood Culture (routine x 2)     Status: None (Preliminary result)   Collection Time: 08/16/20  6:33 PM   Specimen: BLOOD  Result Value Ref Range   Specimen Description       BLOOD RIGHT ANTECUBITAL Performed at [facility], [address] [place], [place] [address]    Special Requests      BOTTLES DRAWN AEROBIC AND ANAEROBIC Blood Culture adequate volume Performed at [facility], [address] [place], [place] [address]    Culture  Setup Time      GRAM NEGATIVE RODS IN BOTH  AEROBIC AND ANAEROBIC BOTTLES Organism ID to follow CRITICAL RESULT CALLED TO, READ BACK BY AND VERIFIED WITH: PHARMD MCHELLE BELL AT 1408 ON 08/17/20 BY KJ Performed at [facility], [address] [place], [place] [address]    Culture PENDING    Report Status PENDING   Protime-INR     Status: None   Collection Time: 08/16/20  6:33 PM  Result Value Ref Range   Prothrombin Time 13.2 11.4 - 15.2 seconds   INR 1.0 0.8 - 1.2    Comment: (NOTE) INR goal varies based on device and disease states. Performed at [facility], [address] [place], [place] [address]   APTT     Status: None   Collection Time: 08/16/20  6:33 PM  Result Value Ref Range   aPTT 24 24 - 36 seconds    Comment: Performed at [facility], [address] [place], [place] [address]  Blood Culture ID Panel (Reflexed)     Status: Abnormal   Collection Time: 08/16/20  6:33 PM  Result Value Ref Range   Enterococcus faecalis NOT DETECTED NOT DETECTED   Enterococcus Faecium NOT DETECTED NOT DETECTED   Listeria monocytogenes NOT DETECTED NOT DETECTED   Staphylococcus species NOT DETECTED NOT DETECTED   Staphylococcus aureus (BCID) NOT DETECTED NOT DETECTED   Staphylococcus epidermidis NOT DETECTED NOT DETECTED   Staphylococcus lugdunensis NOT DETECTED NOT DETECTED   Streptococcus species NOT DETECTED NOT DETECTED   Streptococcus agalactiae NOT DETECTED NOT DETECTED   Streptococcus pneumoniae NOT DETECTED NOT DETECTED   Streptococcus pyogenes NOT DETECTED NOT DETECTED   A.calcoaceticus-baumannii NOT DETECTED NOT DETECTED   Bacteroides fragilis NOT DETECTED  NOT DETECTED   Enterobacterales DETECTED (A) NOT DETECTED    Comment: Enterobacterales represent a large order of gram negative bacteria, not a single organism. CRITICAL RESULT CALLED TO, READ BACK BY AND VERIFIED WITH: PHARMD MICHELLE BELL AT 1440 ON 08/17/20 BY KJ    Enterobacter cloacae complex NOT DETECTED NOT DETECTED   Escherichia coli NOT DETECTED NOT DETECTED   Klebsiella aerogenes NOT DETECTED NOT DETECTED   Klebsiella oxytoca NOT DETECTED NOT DETECTED   Klebsiella pneumoniae NOT DETECTED NOT DETECTED   Proteus species NOT DETECTED NOT DETECTED   Salmonella species NOT DETECTED NOT DETECTED   Serratia marcescens DETECTED (A) NOT DETECTED    Comment: CRITICAL RESULT CALLED TO, READ BACK BY AND VERIFIED WITH: PHARMD MICHELLE BELL  AT 1439 ON 08/17/20 BY KJ    Haemophilus influenzae NOT DETECTED NOT DETECTED   Neisseria meningitidis NOT DETECTED NOT DETECTED   Pseudomonas aeruginosa NOT DETECTED NOT DETECTED   Stenotrophomonas maltophilia NOT DETECTED NOT DETECTED   Candida albicans NOT DETECTED NOT DETECTED   Candida auris NOT DETECTED NOT DETECTED   Candida glabrata NOT DETECTED NOT DETECTED   Candida krusei NOT DETECTED NOT DETECTED   Candida parapsilosis NOT DETECTED NOT DETECTED   Candida tropicalis NOT DETECTED NOT DETECTED   Cryptococcus neoformans/gattii NOT DETECTED NOT DETECTED   CTX-M ESBL NOT DETECTED NOT DETECTED   Carbapenem resistance IMP NOT DETECTED NOT DETECTED   Carbapenem resistance KPC NOT DETECTED NOT DETECTED   Carbapenem resistance NDM NOT DETECTED NOT DETECTED   Carbapenem resist OXA 48 LIKE NOT DETECTED NOT DETECTED   Carbapenem resistance VIM NOT DETECTED NOT DETECTED    Comment: Performed at [facility], [address], [place], [place] [address]  Lactic acid, plasma     Status: None  Collection Time: 08/17/20  1:23 AM  Result Value Ref Range   Lactic Acid, Venous 0.7 0.5 - 1.9 mmol/L    Comment: Performed at [facility], [address], [place], [place] [address]  Protime-INR     Status: None   Collection Time: 08/17/20  1:23 AM  Result Value Ref Range   Prothrombin Time 13.8 11.4 - 15.2 seconds   INR 1.1 0.8 - 1.2    Comment: (NOTE) INR goal varies based on device and disease states. Performed at [facility], [address], [place], [place] [address]   Procalcitonin     Status: None   Collection Time: 08/17/20  1:23 AM  Result Value Ref Range   Procalcitonin 0.59 ng/mL    Comment:        Interpretation: PCT > 0.5 ng/mL and <= 2 ng/mL: Systemic infection (sepsis) is possible, but other conditions are known to elevate PCT as well. (NOTE)       Sepsis PCT Algorithm           Lower Respiratory Tract                                      Infection PCT Algorithm    ----------------------------     ----------------------------         PCT < 0.25 ng/mL                PCT < 0.10 ng/mL          Strongly encourage             Strongly discourage   discontinuation of antibiotics    initiation of antibiotics    ----------------------------     -----------------------------       PCT 0.25 - 0.50 ng/mL            PCT 0.10 - 0.25 ng/mL               OR       >80% decrease in PCT            Discourage initiation of                                            antibiotics      Encourage discontinuation           of antibiotics    ----------------------------     -----------------------------         PCT >= 0.50 ng/mL              PCT 0.26 - 0.50 ng/mL                AND       <80% decrease in PCT             Encourage initiation of                                             antibiotics       Encourage continuation           of antibiotics    ----------------------------     -----------------------------  PCT >= 0.50 ng/mL                  PCT > 0.50 ng/mL               AND         increase in PCT                  Strongly encourage                                       initiation of antibiotics    Strongly encourage escalation           of antibiotics                                     -----------------------------                                           PCT <= 0.25 ng/mL                                                 OR                                        > 80% decrease in PCT                                      Discontinue / Do not initiate                                             antibiotics  Performed at [facility], [address], [place], [place] [address]   Basic metabolic panel     Status: Abnormal   Collection Time: 08/17/20  1:23 AM  Result Value Ref Range   Sodium 136 135 - 145 mmol/L   Potassium 3.6 3.5 - 5.1 mmol/L   Chloride 102 98 - 111 mmol/L   CO2 24 22 - 32 mmol/L   Glucose, Bld 116 (H) 70 - 99 mg/dL    Comment: Glucose reference range applies only to samples taken after fasting for at least 8 hours.   BUN 6 6 - 20 mg/dL   Creatinine, Ser 6.04 0.61 - 1.24 mg/dL   Calcium 8.9 8.9 - 54.0 mg/dL   GFR, Estimated >98 >11 mL/min    Comment: (NOTE) Calculated using the CKD-EPI Creatinine Equation (2021)    Anion gap 10 5 - 15    Comment: Performed at [facility], [address] [place], [place] [address]  CBC     Status: Abnormal   Collection Time: 08/17/20  1:23 AM  Result Value Ref Range   WBC 16.0 (H) 4.0 - 10.5 K/uL   RBC 4.80 4.22 - 5.81 MIL/uL  Hemoglobin 12.4 (L) 13.0 - 17.0 g/dL   HCT 45.4 (L) 09.8 - 11.9 %   MCV 78.3 (L) 80.0 - 100.0 fL   MCH 25.8 (L) 26.0 - 34.0 pg   MCHC 33.0 30.0 - 36.0 g/dL   RDW 14.7 82.9 - 56.2 %   Platelets 430 (H) 150 - 400 K/uL   nRBC 0.0 0.0 - 0.2 %    Comment: Performed at [facility], [address] [place], [place] [address]  Lipid panel     Status: Abnormal   Collection Time: 08/17/20  1:23 AM  Result Value Ref Range   Cholesterol 119 0 - 200 mg/dL   Triglycerides 65 <578 mg/dL   HDL 24 (L) >46 mg/dL    Total CHOL/HDL Ratio 5.0 RATIO   VLDL 13 0 - 40 mg/dL   LDL Cholesterol 82 0 - 99 mg/dL    Comment:        Total Cholesterol/HDL:CHD Risk Coronary Heart Disease Risk Table                     Men   Women  1/2 Average Risk   3.4   3.3  Average Risk       5.0   4.4  2 X Average Risk   9.6   7.1  3 X Average Risk  23.4   11.0        Use the calculated Patient Ratio above and the CHD Risk Table to determine the patient's CHD Risk.        ATP III CLASSIFICATION (LDL):  <100     mg/dL   Optimal  962-952  mg/dL   Near or Above                    Optimal  130-159  mg/dL   Borderline  841-324  mg/dL   High  >401     mg/dL   Very High Performed at [facility], [address], [place], [place] [address]   Cortisol-am, blood     Status: None   Collection Time: 08/17/20  4:41 AM  Result Value Ref Range   Cortisol - AM 12.5 6.7 - 22.6 ug/dL    Comment: Performed at [facility], [address], [place], [place] [address]  Sedimentation rate     Status: Abnormal   Collection Time: 08/17/20 11:44 AM  Result Value Ref Range   Sed Rate 57 (H) 0 - 16 mm/hr    Comment: Performed at [facility], [address] [place], [place] [address]  C-reactive protein     Status: Abnormal   Collection Time: 08/17/20 11:44 AM  Result Value Ref Range   CRP 11.6 (H) <1.0 mg/dL    Comment: Performed at [facility], [address], [place], [place] [address]    MICRO:  IMAGING: CT Abdomen Pelvis W Contrast  Result Date: 08/16/2020 CLINICAL DATA:  45 year old male with abdominal pain. History of IV drug use and osteomyelitis. EXAM: CT ABDOMEN AND PELVIS WITH CONTRAST TECHNIQUE: Multidetector CT imaging of the abdomen and pelvis was performed using the standard protocol following bolus administration of intravenous contrast. CONTRAST:  OMNIPAQUE IOHEXOL 300 MG/ML  SOLN COMPARISON:  None. FINDINGS: Lower chest: Several scattered bilateral  pulmonary nodules suspicious for septic emboli in this patient with history of IV drug use. Clinical correlation is recommended. Chest CT may provide better evaluation if clinically indicated. No  intra-abdominal free air or free fluid. Hepatobiliary: No focal liver abnormality is seen. No gallstones, gallbladder wall thickening, or biliary dilatation. Pancreas: Unremarkable. No pancreatic ductal dilatation or surrounding inflammatory changes. Spleen: Normal in size without focal abnormality. Adrenals/Urinary Tract: The adrenal glands unremarkable. The kidneys, visualized ureters, and urinary bladder appear unremarkable. Stomach/Bowel: Postsurgical changes of Nissen fundoplication. Mild thickened appearance of the rectosigmoid, likely related to underdistention. Active inflammation is less likely. There is no bowel obstruction. The appendix is not visualized with certainty. No inflammatory changes identified in the right lower quadrant. Vascular/Lymphatic: Mild atherosclerotic calcification of the abdominal aorta. The IVC is unremarkable. No portal venous gas. There is no adenopathy. There is mild diffuse mesenteric stranding. Reproductive: The prostate and seminal vesicles are grossly unremarkable. Other: None Musculoskeletal: No acute or significant osseous findings. IMPRESSION: 1. Several scattered bilateral pulmonary nodules suspicious for septic emboli. 2. Mild thickened appearance of the rectosigmoid, likely related to underdistention. 3. No bowel obstruction. 4. Aortic Atherosclerosis (ICD10-I70.0). Electronically Signed   By: Elgie Collard M.D.   On: 08/16/2020 20:10   DG Chest Port 1 View  Result Date: 08/16/2020 CLINICAL DATA:  Questionable sepsis EXAM: PORTABLE CHEST 1 VIEW COMPARISON:  12/25/2018 FINDINGS: Heart and mediastinal contours are within normal limits. No focal opacities or effusions. No acute bony abnormality. IMPRESSION: No active disease. Electronically Signed   By: Charlett Nose M.D.    On: 08/16/2020 19:02   ECHOCARDIOGRAM COMPLETE  Result Date: 08/17/2020    ECHOCARDIOGRAM REPORT   Patient Name:   SUMNER KIRCHMAN Date of Exam: 08/17/2020 Medical Rec #:  [id number]      Height:       77.0 in Accession #:    [id number]     Weight:       185.2 lb Date of Birth:  11/04/1975      BSA:          2.165 m Patient Age:    45 years       BP:           140/95 mmHg Patient Gender: M              HR:           88 bpm. Exam Location:  Inpatient Procedure: 2D Echo, Cardiac Doppler and Color Doppler Indications:    Pulmonary embolus  History:        Patient has no prior history of Echocardiogram examinations.                 Risk Factors:Hypertension.  Sonographer:    Neomia Dear RDCS Referring Phys: [id number] JAN A MANSY IMPRESSIONS  1. Left ventricular ejection fraction, by estimation, is 50 to 55%. The left ventricle has low normal function. The left ventricle has no regional wall motion abnormalities. Left ventricular diastolic parameters are consistent with Grade I diastolic dysfunction (impaired relaxation).  2. Right ventricular systolic function is normal. The right ventricular size is normal. Tricuspid regurgitation signal is inadequate for assessing PA pressure.  3. The mitral valve is normal in structure. Trivial mitral valve regurgitation. No evidence of mitral stenosis.  4. The aortic valve is tricuspid. Aortic valve regurgitation is not visualized. No aortic stenosis is present.  5. The inferior vena cava is normal in size with greater than 50% respiratory variability, suggesting right atrial pressure of 3 mmHg. FINDINGS  Left Ventricle: Left ventricular ejection fraction, by estimation, is 50 to 55%. The left ventricle has low normal function. The left ventricle  has no regional wall motion abnormalities. The left ventricular internal cavity size was normal in size. There is no left ventricular hypertrophy. Left ventricular diastolic parameters are consistent with Grade I diastolic dysfunction  (impaired relaxation). Right Ventricle: The right ventricular size is normal. Right ventricular systolic function is normal. Tricuspid regurgitation signal is inadequate for assessing PA pressure. The tricuspid regurgitant velocity is 1.95 m/s, and with an assumed right atrial  pressure of 3 mmHg, the estimated right ventricular systolic pressure is 18.2 mmHg. Left Atrium: Left atrial size was normal in size. Right Atrium: Right atrial size was normal in size. Pericardium: There is no evidence of pericardial effusion. Mitral Valve: The mitral valve is normal in structure. Trivial mitral valve regurgitation. No evidence of mitral valve stenosis. MV peak gradient, 3.4 mmHg. The mean mitral valve gradient is 2.0 mmHg. Tricuspid Valve: The tricuspid valve is normal in structure. Tricuspid valve regurgitation is trivial. No evidence of tricuspid stenosis. Aortic Valve: The aortic valve is tricuspid. Aortic valve regurgitation is not visualized. No aortic stenosis is present. Aortic valve mean gradient measures 4.0 mmHg. Aortic valve peak gradient measures 7.3 mmHg. Aortic valve area, by VTI measures 4.78 cm. Pulmonic Valve: The pulmonic valve was normal in structure. Pulmonic valve regurgitation is not visualized. No evidence of pulmonic stenosis. Aorta: The aortic root is normal in size and structure. Venous: The inferior vena cava is normal in size with greater than 50% respiratory variability, suggesting right atrial pressure of 3 mmHg. IAS/Shunts: No atrial level shunt detected by color flow Doppler.  LEFT VENTRICLE PLAX 2D LVIDd:         5.20 cm      Diastology LVIDs:         4.00 cm      LV e' medial:    6.09 cm/s LV PW:         1.10 cm      LV E/e' medial:  13.8 LV IVS:        0.70 cm      LV e' lateral:   14.10 cm/s LVOT diam:     2.70 cm      LV E/e' lateral: 6.0 LV SV:         124 LV SV Index:   57 LVOT Area:     5.73 cm  LV Volumes (MOD) LV vol d, MOD A2C: 110.0 ml LV vol d, MOD A4C: 131.0 ml LV vol s, MOD  A2C: 69.5 ml LV vol s, MOD A4C: 72.8 ml LV SV MOD A2C:     40.5 ml LV SV MOD A4C:     131.0 ml LV SV MOD BP:      48.2 ml RIGHT VENTRICLE RV Basal diam:  3.50 cm RV Mid diam:    2.80 cm RV S prime:     19.10 cm/s TAPSE (M-mode): 2.2 cm LEFT ATRIUM             Index       RIGHT ATRIUM           Index LA diam:        3.60 cm 1.66 cm/m  RA Area:     12.60 cm LA Vol (A2C):   48.0 ml 22.17 ml/m RA Volume:   30.70 ml  14.18 ml/m LA Vol (A4C):   65.3 ml 30.16 ml/m LA Biplane Vol: 56.9 ml 26.28 ml/m  AORTIC VALVE  PULMONIC VALVE AV Area (Vmax):    5.22 cm    PV Vmax:       0.95 m/s AV Area (Vmean):   4.77 cm    PV Vmean:      68.800 cm/s AV Area (VTI):     4.78 cm    PV VTI:        0.176 m AV Vmax:           135.00 cm/s PV Peak grad:  3.6 mmHg AV Vmean:          96.000 cm/s PV Mean grad:  2.0 mmHg AV VTI:            0.260 m AV Peak Grad:      7.3 mmHg AV Mean Grad:      4.0 mmHg LVOT Vmax:         123.00 cm/s LVOT Vmean:        79.900 cm/s LVOT VTI:          0.217 m LVOT/AV VTI ratio: 0.83  AORTA Ao Root diam: 3.30 cm Ao Asc diam:  3.20 cm MITRAL VALVE               TRICUSPID VALVE MV Area (PHT): 4.71 cm    TR Peak grad:   15.2 mmHg MV Area VTI:   5.89 cm    TR Vmax:        195.00 cm/s MV Peak grad:  3.4 mmHg MV Mean grad:  2.0 mmHg    SHUNTS MV Vmax:       0.92 m/s    Systemic VTI:  0.22 m MV Vmean:      65.2 cm/s   Systemic Diam: 2.70 cm MV Decel Time: 161 msec MV E velocity: 84.20 cm/s MV A velocity: 86.40 cm/s MV E/A ratio:  0.97 Olga MillersBrian Crenshaw MD Electronically signed by Olga MillersBrian Crenshaw MD Signature Date/Time: 08/17/2020/2:32:39 PM    Final      Assessment/Plan:  45yo M with serratia bacteremia, 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 If not recently treated in the beginning of the year, we will need to do so  Plan to recheck for hcv and hiv ab

## 2020-08-17 NOTE — Progress Notes (Signed)
PROGRESS NOTE    Logan Fisher  KZS:[id number] DOB: 09/17/1975 DOA: 08/16/2020 PCP: Heywood Bene, PA-C   Brief Narrative: 45 year old with past medical history significant for hypertension, dyslipidemia hiatal hernia IV drug abuse who presents to the emergency room complaining of acute onset of excessive fatigue and tiredness associated with back pain as well as with dry cough and dyspnea without wheezing.  He reports fevers. Evaluation in the ER: Leukocytosis white count of 14, COVID-19 PCR negative, chest x-ray no acute cardiopulmonary disease.  CT abdomen and pelvis: Several scattered bilateral pulmonary nodules suspicious for septic emboli.  Mild thickening appearance of the rectosigmoid likely related to underdistention.   Assessment & Plan:   Active Problems:   Septic embolism (HCC)  1-Septic Lung Emboli:  -Blood cultures; No growth in less 12 hours.  -2D echo pending. -IV antibiotics Vancomycin and cefepime.  -ESR and CRP elevated.  2-HTN As needed labetalol  3-Anxiety: On as needed Xanax  4-Back pain: Plan to proceed with MRI lumbar spine.    Estimated body mass index is 21.96 kg/m as calculated from the following:   Height as of this encounter: _0  (1.956 m).   Weight as of this encounter: 84 kg.   DVT prophylaxis: Lovenox Code Status: Full code Family Communication: care discussed with patient.  Disposition Plan:  Status is: Inpatient  Remains inpatient appropriate because:Ongoing active pain requiring inpatient pain management and IV treatments appropriate due to intensity of illness or inability to take PO  Dispo: The patient is from: Home              Anticipated d/c is to: Home              Patient currently is not medically stable to d/c.   Difficult to place patient No        Consultants:  None  Procedures:  ECHO; pending  Antimicrobials:    Subjective: He is complaining of back pain, severe. He feels very tired and  fatigue.    Objective: Vitals:   08/16/20 1930 08/16/20 2139 08/17/20 0011 08/17/20 0452  BP: (!) 150/95 129/84 (!) 143/98 (!) 140/95  Pulse: 89 84 88 92  Resp: _1 Temp:  98.8 F (37.1 C) 98.4 F (36.9 C) 98.7 F (37.1 C)  TempSrc:  Oral Oral Oral  SpO2: 100% 98% 99% 100%  Weight:      Height:        Intake/Output Summary (Last 24 hours) at 08/17/2020 0724 Last data filed at 08/17/2020 0600 Gross per 24 hour  Intake 5391.95 ml  Output 700 ml  Net 4691.95 ml   Filed Weights   08/16/20 1638  Weight: 84 kg    Examination:  General exam: Appears calm and comfortable  Respiratory system: Clear to auscultation. Respiratory effort normal. Cardiovascular system: S1 & S2 heard, RRR. No JVD, murmurs, rubs, gallops or clicks. No pedal edema. Gastrointestinal system: Abdomen is nondistended, so74f and nontender. No organomegaly or masses felt. Normal bowel sounds heard. Central nervous system: Alert and oriented. No focal neurological deficits. Extremities: Symmetric 5 x 5 power.   Data Reviewed: I have personally reviewed following labs and imaging studies  CBC: Recent Labs  Lab 08/16/20 1812 08/17/20 0123  WBC 14.9* 16.0*  NEUTROABS 11.9*  --   HGB 12.6* 12.4*  HCT 37.6* 37.6*  MCV 76.3* 78.3*  PLT 451* 4732   Basic Metabolic Panel: Recent Labs  Lab 08/16/20 1812 08/17/20 0123  NA 136  136  K 4.2 3.6  CL 103 102  CO2 25 24  GLUCOSE 112* 116*  BUN 7 6  CREATININE 0.69 0.78  CALCIUM 8.6* 8.9   GFR: Estimated Creatinine Clearance: 135.6 mL/min (by C-G formula based on SCr of 0.78 mg/dL). Liver Function Tests: Recent Labs  Lab 08/16/20 1812  AST 24  ALT 16  ALKPHOS 88  BILITOT 0.7  PROT 7.3  ALBUMIN 3.1*   No results for input(s): LIPASE, AMYLASE in the last 168 hours. No results for input(s): AMMONIA in the last 168 hours. Coagulation Profile: Recent Labs  Lab 08/16/20 1833 08/17/20 0123  INR 1.0 1.1   Cardiac Enzymes: No results  for input(s): CKTOTAL, CKMB, CKMBINDEX, TROPONINI in the last 168 hours. BNP (last 3 results) No results for input(s): PROBNP in the last 8760 hours. HbA1C: No results for input(s): HGBA1C in the last 72 hours. CBG: No results for input(s): GLUCAP in the last 168 hours. Lipid Profile: Recent Labs    08/17/20 0123  CHOL 119  HDL 24*  LDLCALC 82  TRIG 65  CHOLHDL 5.0   Thyroid Function Tests: No results for input(s): TSH, T4TOTAL, FREET4, T3FREE, THYROIDAB in the last 72 hours. Anemia Panel: No results for input(s): VITAMINB12, FOLATE, FERRITIN, TIBC, IRON, RETICCTPCT in the last 72 hours. Sepsis Labs: Recent Labs  Lab 08/16/20 1812 08/17/20 0123  PROCALCITON  --  0.59  LATICACIDVEN 1.2 0.7    Recent Results (from the past 240 hour(s))  Resp Panel by RT-PCR (Flu A&B, Covid) Nasopharyngeal Swab     Status: None   Collection Time: 08/16/20  6:12 PM   Specimen: Nasopharyngeal Swab; Nasopharyngeal(NP) swabs in vial transport medium  Result Value Ref Range Status   SARS Coronavirus 2 by RT PCR NEGATIVE NEGATIVE Final    Comment: (NOTE) SARS-CoV-2 target nucleic acids are NOT DETECTED.  The SARS-CoV-2 RNA is generally detectable in upper respiratory specimens during the acute phase of infection. The lowest concentration of SARS-CoV-2 viral copies this assay can detect is 138 copies/mL. A negative result does not preclude SARS-Cov-2 infection and should not be used as the sole basis for treatment or other patient management decisions. A negative result may occur with  improper specimen collection/handling, submission of specimen other than nasopharyngeal swab, presence of viral mutation(s) within the areas targeted by this assay, and inadequate number of viral copies(<138 copies/mL). A negative result must be combined with clinical observations, patient history, and epidemiological information. The expected result is Negative.  Fact Sheet for Patients:   EntrepreneurPulse.com.au  Fact Sheet for Healthcare Providers:  IncredibleEmployment.be  This test is no t yet approved or cleared by the [place] FDA and  has been authorized for detection and/or diagnosis of SARS-CoV-2 by FDA under an Emergency Use Authorization (EUA). This EUA will remain  in effect (meaning this test can be used) for the duration of the COVID-19 declaration under Section 564(b)(1) of the Act, 21 U.S.C.section 360bbb-3(b)(1), unless the authorization is terminated  or revoked sooner.       Influenza A by PCR NEGATIVE NEGATIVE Final   Influenza B by PCR NEGATIVE NEGATIVE Final    Comment: (NOTE) The Xpert Xpress SARS-CoV-2/FLU/RSV plus assay is intended as an aid in the diagnosis of influenza from Nasopharyngeal swab specimens and should not be used as a sole basis for treatment. Nasal washings and aspirates are unacceptable for Xpert Xpress SARS-CoV-2/FLU/RSV testing.  Fact Sheet for Patients: EntrepreneurPulse.com.au  Fact Sheet for Healthcare Providers: IncredibleEmployment.be  This test  is not yet approved or cleared by the [place] and has been authorized for detection and/or diagnosis of SARS-CoV-2 by FDA under an Emergency Use Authorization (EUA). This EUA will remain in effect (meaning this test can be used) for the duration of the COVID-19 declaration under Section 564(b)(1) of the Act, 21 U.S.C. section 360bbb-3(b)(1), unless the authorization is terminated or revoked.  Performed at [facility], [address] [place], [place] [address]          Radiology Studies: CT Abdomen Pelvis W Contrast  Result Date: 08/16/2020 CLINICAL DATA:  45 year old male with abdominal pain. History of IV drug use and osteomyelitis. EXAM: CT ABDOMEN AND PELVIS WITH CONTRAST TECHNIQUE: Multidetector CT imaging of the abdomen and pelvis was performed using the  standard protocol following bolus administration of intravenous contrast. CONTRAST:  146m OMNIPAQUE IOHEXOL 300 MG/ML  SOLN COMPARISON:  None. FINDINGS: Lower chest: Several scattered bilateral pulmonary nodules suspicious for septic emboli in this patient with history of IV drug use. Clinical correlation is recommended. Chest CT may provide better evaluation if clinically indicated. No intra-abdominal free air or free fluid. Hepatobiliary: No focal liver abnormality is seen. No gallstones, gallbladder wall thickening, or biliary dilatation. Pancreas: Unremarkable. No pancreatic ductal dilatation or surrounding inflammatory changes. Spleen: Normal in size without focal abnormality. Adrenals/Urinary Tract: The adrenal glands unremarkable. The kidneys, visualized ureters, and urinary bladder appear unremarkable. Stomach/Bowel: Postsurgical changes of Nissen fundoplication. Mild thickened appearance of the rectosigmoid, likely related to underdistention. Active inflammation is less likely. There is no bowel obstruction. The appendix is not visualized with certainty. No inflammatory changes identified in the right lower quadrant. Vascular/Lymphatic: Mild atherosclerotic calcification of the abdominal aorta. The IVC is unremarkable. No portal venous gas. There is no adenopathy. There is mild diffuse mesenteric stranding. Reproductive: The prostate and seminal vesicles are grossly unremarkable. Other: None Musculoskeletal: No acute or significant osseous findings. IMPRESSION: 1. Several scattered bilateral pulmonary nodules suspicious for septic emboli. 2. Mild thickened appearance of the rectosigmoid, likely related to underdistention. 3. No bowel obstruction. 4. Aortic Atherosclerosis (ICD10-I70.0). Electronically Signed   By: AAnner CreteM.D.   On: 08/16/2020 20:10   DG Chest Port 1 View  Result Date: 08/16/2020 CLINICAL DATA:  Questionable sepsis EXAM: PORTABLE CHEST 1 VIEW COMPARISON:  12/25/2018 FINDINGS:  Heart and mediastinal contours are within normal limits. No focal opacities or effusions. No acute bony abnormality. IMPRESSION: No active disease. Electronically Signed   By: KRolm BaptiseM.D.   On: 08/16/2020 19:02        Scheduled Meds:  enoxaparin (LOVENOX) injection  40 mg Subcutaneous Q24H   Continuous Infusions:  sodium chloride 100 mL/hr at 08/17/20 0151   ceFEPime (MAXIPIME) IV     metronidazole 500 mg (08/17/20 0302)   vancomycin       LOS: 0 days    Time spent: 35 minutes     Dalyce Renne A Byanka Landrus, MD [facility]   If 7PM-7AM, please contact night-coverage [email or web address]  08/17/2020, 7:24 AM

## 2020-08-18 ENCOUNTER — Inpatient Hospital Stay (HOSPITAL_COMMUNITY): Payer: Self-pay

## 2020-08-18 LAB — URINE CULTURE: Culture: NO GROWTH

## 2020-08-18 LAB — BASIC METABOLIC PANEL
Anion gap: 7 (ref 5–15)
BUN: 7 mg/dL (ref 6–20)
CO2: 26 mmol/L (ref 22–32)
Calcium: 8.5 mg/dL — ABNORMAL LOW (ref 8.9–10.3)
Chloride: 104 mmol/L (ref 98–111)
Creatinine, Ser: 0.75 mg/dL (ref 0.61–1.24)
GFR, Estimated: >60 mL/min (ref >60)
Glucose, Bld: 145 mg/dL — ABNORMAL HIGH (ref 70–99)
Potassium: 3.6 mmol/L (ref 3.5–5.1)
Sodium: 137 mmol/L (ref 135–145)

## 2020-08-18 LAB — CBC
HCT: 38 % — ABNORMAL LOW (ref 39.0–52.0)
Hemoglobin: 12.4 g/dL — ABNORMAL LOW (ref 13.0–17.0)
MCH: 25.7 pg — ABNORMAL LOW (ref 26.0–34.0)
MCHC: 32.6 g/dL (ref 30.0–36.0)
MCV: 78.8 fL — ABNORMAL LOW (ref 80.0–100.0)
Platelets: 456 10*3/uL — ABNORMAL HIGH (ref 150–400)
RBC: 4.82 MIL/uL (ref 4.22–5.81)
RDW: 14.5 % (ref 11.5–15.5)
WBC: 12.3 10*3/uL — ABNORMAL HIGH (ref 4.0–10.5)
nRBC: 0 % (ref 0.0–0.2)

## 2020-08-18 LAB — RPR: RPR Ser Ql: NONREACTIVE

## 2020-08-18 LAB — HIV ANTIBODY (ROUTINE TESTING W REFLEX): HIV Screen 4th Generation wRfx: NONREACTIVE

## 2020-08-18 MED ORDER — METHOCARBAMOL 500 MG PO TABS
500.0000 mg | ORAL_TABLET | Freq: Four times a day (QID) | ORAL | Status: DC | PRN
  Administered 2020-08-18 – 2020-08-19 (×2): 500 mg via ORAL
  Filled 2020-08-18 (×2): qty 1

## 2020-08-18 NOTE — Progress Notes (Signed)
PROGRESS NOTE    Logan Fisher  RXV:[id number] DOB: 10-Dec-1975 DOA: 08/16/2020 PCP: Heywood Bene, PA-C   Brief Narrative: 45 year old with past medical history significant for hypertension, dyslipidemia hiatal hernia IV drug abuse who presents to the emergency room complaining of acute onset of excessive fatigue and tiredness associated with back pain as well as with dry cough and dyspnea without wheezing.  He reports fevers. Evaluation in the ER: Leukocytosis white count of 14, COVID-19 PCR negative, chest x-ray no acute cardiopulmonary disease.  CT abdomen and pelvis: Several scattered bilateral pulmonary nodules suspicious for septic emboli.  Mild thickening appearance of the rectosigmoid likely related to underdistention.   Assessment & Plan:   Active Problems:   Septic embolism (HCC)  1-Septic Lung Emboli: Serratia Marcescens Bacteremia;  -Blood cultures; Serratia Marcescens  -2D echo  normal EF, diastolic dysfunction grade one.  -IV antibiotics cefepime. Vancomycin discontinue.  -ESR and CRP elevated. -ID consulted, plan for TEE. Cardiology consulted. Hopefully TEE on Friday.  -MRI lumbar spine negative for discitis.   2-HTN As needed labetalol  3-Anxiety: On as needed Xanax  4-Back pain: MRI negative for infection.     Estimated body mass index is 21.96 kg/m as calculated from the following:   Height as of this encounter: 6' 5" (1.956 m).   Weight as of this encounter: 84 kg.   DVT prophylaxis: Lovenox Code Status: Full code Family Communication: care discussed with patient.  Disposition Plan:  Status is: Inpatient  Remains inpatient appropriate because:Ongoing active pain requiring inpatient pain management and IV treatments appropriate due to intensity of illness or inability to take PO  Dispo: The patient is from: Home              Anticipated d/c is to: Home              Patient currently is not medically stable to d/c.   Difficult to place  patient No        Consultants:  None  Procedures:  ECHO; pending  Antimicrobials:    Subjective: He is still having back pain. He would like regular diet. No BM admission.   Objective: Vitals:   08/17/20 1132 08/17/20 2029 08/18/20 0617 08/18/20 1239  BP: 125/85 (!) 144/92 130/87 (!) 136/93  Pulse: 92 98 88 84  Resp: _0 Temp: 98.1 F (36.7 C) 97.9 F (36.6 C) 98.1 F (36.7 C) 97.6 F (36.4 C)  TempSrc:    Oral  SpO2: 98% 100% 99% 100%  Weight:      Height:        Intake/Output Summary (Last 24 hours) at 08/18/2020 1637 Last data filed at 08/18/2020 1634 Gross per 24 hour  Intake 1570.25 ml  Output --  Net 1570.25 ml    Filed Weights   08/16/20 1638  Weight: 84 kg    Examination:  General exam: NAD Respiratory system: CTA Cardiovascular system:  S 1, S 2 RRR Gastrointestinal system: BS present, soft, nt Central nervous system: non focal.  Extremities: Symmetric power.    Data Reviewed: I have personally reviewed following labs and imaging studies  CBC: Recent Labs  Lab 08/16/20 1812 08/17/20 0123 08/18/20 0950  WBC 14.9* 16.0* 12.3*  NEUTROABS 11.9*  --   --   HGB 12.6* 12.4* 12.4*  HCT 37.6* 37.6* 38.0*  MCV 76.3* 78.3* 78.8*  PLT 451* 430* 456*    Basic Metabolic Panel: Recent Labs  Lab 08/16/20 1812 08/17/20 0123 08/18/20 0950  NA 136 136 137  K 4.2 3.6 3.6  CL 103 102 104  CO2 _0 GLUCOSE 112* 116* 145*  BUN _1 CREATININE 0.69 0.78 0.75  CALCIUM 8.6* 8.9 8.5*    GFR: Estimated Creatinine Clearance: 135.6 mL/min (by C-G formula based on SCr of 0.75 mg/dL). Liver Function Tests: Recent Labs  Lab 08/16/20 1812  AST 24  ALT 16  ALKPHOS 88  BILITOT 0.7  PROT 7.3  ALBUMIN 3.1*    No results for input(s): LIPASE, AMYLASE in the last 168 hours. No results for input(s): AMMONIA in the last 168 hours. Coagulation Profile: Recent Labs  Lab 08/16/20 1833 08/17/20 0123  INR 1.0 1.1    Cardiac  Enzymes: No results for input(s): CKTOTAL, CKMB, CKMBINDEX, TROPONINI in the last 168 hours. BNP (last 3 results) No results for input(s): PROBNP in the last 8760 hours. HbA1C: No results for input(s): HGBA1C in the last 72 hours. CBG: No results for input(s): GLUCAP in the last 168 hours. Lipid Profile: Recent Labs    08/17/20 0123  CHOL 119  HDL 24*  LDLCALC 82  TRIG 65  CHOLHDL 5.0    Thyroid Function Tests: No results for input(s): TSH, T4TOTAL, FREET4, T3FREE, THYROIDAB in the last 72 hours. Anemia Panel: No results for input(s): VITAMINB12, FOLATE, FERRITIN, TIBC, IRON, RETICCTPCT in the last 72 hours. Sepsis Labs: Recent Labs  Lab 08/16/20 1812 08/17/20 0123  PROCALCITON  --  0.59  LATICACIDVEN 1.2 0.7     Recent Results (from the past 240 hour(s))  Resp Panel by RT-PCR (Flu A&B, Covid) Nasopharyngeal Swab     Status: None   Collection Time: 08/16/20  6:12 PM   Specimen: Nasopharyngeal Swab; Nasopharyngeal(NP) swabs in vial transport medium  Result Value Ref Range Status   SARS Coronavirus 2 by RT PCR NEGATIVE NEGATIVE Final    Comment: (NOTE) SARS-CoV-2 target nucleic acids are NOT DETECTED.  The SARS-CoV-2 RNA is generally detectable in upper respiratory specimens during the acute phase of infection. The lowest concentration of SARS-CoV-2 viral copies this assay can detect is 138 copies/mL. A negative result does not preclude SARS-Cov-2 infection and should not be used as the sole basis for treatment or other patient management decisions. A negative result may occur with  improper specimen collection/handling, submission of specimen other than nasopharyngeal swab, presence of viral mutation(s) within the areas targeted by this assay, and inadequate number of viral copies(<138 copies/mL). A negative result must be combined with clinical observations, patient history, and epidemiological information. The expected result is Negative.  Fact Sheet for  Patients:  EntrepreneurPulse.com.au  Fact Sheet for Healthcare Providers:  IncredibleEmployment.be  This test is no t yet approved or cleared by the [place] FDA and  has been authorized for detection and/or diagnosis of SARS-CoV-2 by FDA under an Emergency Use Authorization (EUA). This EUA will remain  in effect (meaning this test can be used) for the duration of the COVID-19 declaration under Section 564(b)(1) of the Act, 21 U.S.C.section 360bbb-3(b)(1), unless the authorization is terminated  or revoked sooner.       Influenza A by PCR NEGATIVE NEGATIVE Final   Influenza B by PCR NEGATIVE NEGATIVE Final    Comment: (NOTE) The Xpert Xpress SARS-CoV-2/FLU/RSV plus assay is intended as an aid in the diagnosis of influenza from Nasopharyngeal swab specimens and should not be used as a sole basis for treatment. Nasal washings and aspirates are unacceptable for Xpert Xpress SARS-CoV-2/FLU/RSV testing.  Fact Sheet for Patients: EntrepreneurPulse.com.au  Fact Sheet for Healthcare Providers: IncredibleEmployment.be  This test is not yet approved or cleared by the [place] FDA and has been authorized for detection and/or diagnosis of SARS-CoV-2 by FDA under an Emergency Use Authorization (EUA). This EUA will remain in effect (meaning this test can be used) for the duration of the COVID-19 declaration under Section 564(b)(1) of the Act, 21 U.S.C. section 360bbb-3(b)(1), unless the authorization is terminated or revoked.  Performed at [facility], [address] [place], [place] [address]   Urine culture     Status: None   Collection Time: 08/16/20  6:12 PM   Specimen: In/Out Cath Urine  Result Value Ref Range Status   Specimen Description   Final    IN/OUT CATH URINE Performed at [facility], [address] [place], [place] [address]    Special Requests   Final     NONE Performed at [facility], [address] [place], [place] [address]    Culture   Final    NO GROWTH Performed at [facility], [place] [address], [place], [place] [address]    Report Status 08/18/2020 FINAL  Final  Blood Culture (routine x 2)     Status: Abnormal (Preliminary result)   Collection Time: 08/16/20  6:33 PM   Specimen: BLOOD  Result Value Ref Range Status   Specimen Description   Final    BLOOD RIGHT ANTECUBITAL Performed at [facility], [address] [place], [place] [address]    Special Requests   Final    BOTTLES DRAWN AEROBIC AND ANAEROBIC Blood Culture adequate volume Performed at [facility], [address] [place], [place] [address]    Culture  Setup Time   Final    GRAM NEGATIVE RODS IN BOTH AEROBIC AND ANAEROBIC BOTTLES Organism ID to follow CRITICAL RESULT CALLED TO, READ BACK BY AND VERIFIED WITH: PHARMD MCHELLE BELL AT 6861 ON 08/17/20 BY KJ    Culture (A)  Final    SERRATIA MARCESCENS SUSCEPTIBILITIES TO FOLLOW Performed at [facility], [place] [address], [place], [place] [address]    Report Status PENDING  Incomplete  Blood Culture ID Panel (Reflexed)     Status: Abnormal   Collection Time: 08/16/20  6:33 PM  Result Value Ref Range Status   Enterococcus faecalis NOT DETECTED NOT DETECTED Final   Enterococcus Faecium NOT DETECTED NOT DETECTED Final   Listeria monocytogenes NOT DETECTED NOT DETECTED Final   Staphylococcus species NOT DETECTED NOT DETECTED Final   Staphylococcus aureus (BCID) NOT DETECTED NOT DETECTED Final   Staphylococcus epidermidis NOT DETECTED NOT DETECTED Final   Staphylococcus lugdunensis NOT DETECTED NOT DETECTED Final   Streptococcus species NOT DETECTED NOT DETECTED Final   Streptococcus agalactiae NOT DETECTED NOT DETECTED Final   Streptococcus pneumoniae NOT DETECTED NOT DETECTED Final   Streptococcus pyogenes NOT DETECTED NOT DETECTED Final   A.calcoaceticus-baumannii  NOT DETECTED NOT DETECTED Final   Bacteroides fragilis NOT DETECTED NOT DETECTED Final   Enterobacterales DETECTED (A) NOT DETECTED Final    Comment: Enterobacterales represent a large order of gram negative bacteria, not a single organism. CRITICAL RESULT CALLED TO, READ BACK BY AND VERIFIED WITH: PHARMD MICHELLE BELL AT 1440 ON 08/17/20 BY KJ    Enterobacter cloacae complex NOT DETECTED NOT DETECTED Final   Escherichia coli NOT DETECTED NOT DETECTED Final   Klebsiella aerogenes NOT  DETECTED NOT DETECTED Final   Klebsiella oxytoca NOT DETECTED NOT DETECTED Final   Klebsiella pneumoniae NOT DETECTED NOT DETECTED Final   Proteus species NOT DETECTED NOT DETECTED Final   Salmonella species NOT DETECTED NOT DETECTED Final   Serratia marcescens DETECTED (A) NOT DETECTED Final    Comment: CRITICAL RESULT CALLED TO, READ BACK BY AND VERIFIED WITH: PHARMD MICHELLE BELL  AT 1439 ON 08/17/20 BY KJ    Haemophilus influenzae NOT DETECTED NOT DETECTED Final   Neisseria meningitidis NOT DETECTED NOT DETECTED Final   Pseudomonas aeruginosa NOT DETECTED NOT DETECTED Final   Stenotrophomonas maltophilia NOT DETECTED NOT DETECTED Final   Candida albicans NOT DETECTED NOT DETECTED Final   Candida auris NOT DETECTED NOT DETECTED Final   Candida glabrata NOT DETECTED NOT DETECTED Final   Candida krusei NOT DETECTED NOT DETECTED Final   Candida parapsilosis NOT DETECTED NOT DETECTED Final   Candida tropicalis NOT DETECTED NOT DETECTED Final   Cryptococcus neoformans/gattii NOT DETECTED NOT DETECTED Final   CTX-M ESBL NOT DETECTED NOT DETECTED Final   Carbapenem resistance IMP NOT DETECTED NOT DETECTED Final   Carbapenem resistance KPC NOT DETECTED NOT DETECTED Final   Carbapenem resistance NDM NOT DETECTED NOT DETECTED Final   Carbapenem resist OXA 48 LIKE NOT DETECTED NOT DETECTED Final   Carbapenem resistance VIM NOT DETECTED NOT DETECTED Final    Comment: Performed at [facility], [address] [place], [place] [address]  Blood Culture (routine x 2)     Status: Abnormal (Preliminary result)   Collection Time: 08/17/20  1:23 AM   Specimen: BLOOD  Result Value Ref Range Status   Specimen Description   Final    BLOOD RIGHT ARM Performed at [facility] [place] [address], [place], [place] [address]    Special Requests   Final    BOTTLES DRAWN AEROBIC ONLY Blood Culture results may not be optimal due to an excessive volume of blood received in culture bottles Performed at [facility] [address], [place], [place] [address]    Culture  Setup Time   Final    Organism ID to follow AEROBIC BOTTLE ONLY GRAM NEGATIVE RODS CRITICAL RESULT CALLED TO, READ BACK BY AND VERIFIED WITHCristopher Estimable RN 2112 08/17/20 A BROWNING Performed at [facility], [place] [address], [place], [place] [address]    Culture SERRATIA MARCESCENS (A)  Final   Report Status PENDING  Incomplete  Blood Culture ID Panel (Reflexed)     Status: Abnormal   Collection Time: 08/17/20  1:23 AM  Result Value Ref Range Status   Enterococcus faecalis NOT DETECTED NOT DETECTED Final   Enterococcus Faecium NOT DETECTED NOT DETECTED Final   Listeria monocytogenes NOT DETECTED NOT DETECTED Final   Staphylococcus species NOT DETECTED NOT DETECTED Final   Staphylococcus aureus (BCID) NOT DETECTED NOT DETECTED Final   Staphylococcus epidermidis NOT DETECTED NOT DETECTED Final   Staphylococcus lugdunensis NOT DETECTED NOT DETECTED Final   Streptococcus species NOT DETECTED NOT DETECTED Final   Streptococcus agalactiae NOT DETECTED NOT DETECTED Final   Streptococcus pneumoniae NOT DETECTED NOT DETECTED Final   Streptococcus pyogenes NOT DETECTED NOT DETECTED Final   A.calcoaceticus-baumannii NOT DETECTED NOT DETECTED Final   Bacteroides fragilis NOT DETECTED NOT DETECTED Final   Enterobacterales DETECTED (A) NOT DETECTED Final    Comment: Enterobacterales represent a large order  of gram negative bacteria, not a single organism. CRITICAL RESULT CALLED TO, READ BACK  BY AND VERIFIED WITH: Cristopher Estimable [facility] 2112 08/17/20 A BROWNING    Enterobacter cloacae complex NOT DETECTED NOT DETECTED Final   Escherichia coli NOT DETECTED NOT DETECTED Final   Klebsiella aerogenes NOT DETECTED NOT DETECTED Final   Klebsiella oxytoca NOT DETECTED NOT DETECTED Final   Klebsiella pneumoniae NOT DETECTED NOT DETECTED Final   Proteus species NOT DETECTED NOT DETECTED Final   Salmonella species NOT DETECTED NOT DETECTED Final   Serratia marcescens DETECTED (A) NOT DETECTED Final    Comment: CRITICAL RESULT CALLED TO, READ BACK BY AND VERIFIED WITH: Cristopher Estimable PHARMD 2112 08/17/20 A BROWNING    Haemophilus influenzae NOT DETECTED NOT DETECTED Final   Neisseria meningitidis NOT DETECTED NOT DETECTED Final   Pseudomonas aeruginosa NOT DETECTED NOT DETECTED Final   Stenotrophomonas maltophilia NOT DETECTED NOT DETECTED Final   Candida albicans NOT DETECTED NOT DETECTED Final   Candida auris NOT DETECTED NOT DETECTED Final   Candida glabrata NOT DETECTED NOT DETECTED Final   Candida krusei NOT DETECTED NOT DETECTED Final   Candida parapsilosis NOT DETECTED NOT DETECTED Final   Candida tropicalis NOT DETECTED NOT DETECTED Final   Cryptococcus neoformans/gattii NOT DETECTED NOT DETECTED Final   CTX-M ESBL NOT DETECTED NOT DETECTED Final   Carbapenem resistance IMP NOT DETECTED NOT DETECTED Final   Carbapenem resistance KPC NOT DETECTED NOT DETECTED Final   Carbapenem resistance NDM NOT DETECTED NOT DETECTED Final   Carbapenem resist OXA 48 LIKE NOT DETECTED NOT DETECTED Final   Carbapenem resistance VIM NOT DETECTED NOT DETECTED Final    Comment: Performed at [facility], [address] [place], [place] [address]          Radiology Studies: MR LUMBAR SPINE WO CONTRAST  Result Date: 08/18/2020 CLINICAL DATA:  Low back pain rule out infection. History of IV drug abuse.  EXAM: MRI LUMBAR SPINE WITHOUT CONTRAST TECHNIQUE: Multiplanar, multisequence MR imaging of the lumbar spine was performed. No intravenous contrast was administered. COMPARISON:  None. FINDINGS: Segmentation:  Normal Alignment:  Normal Vertebrae: Negative for fracture or mass. No bone marrow edema. No evidence of spinal infection. Conus medullaris and cauda equina: Conus extends to the L1-2 level. Conus and cauda equina appear normal. Paraspinal and other soft tissues: Negative for paraspinous mass, adenopathy, or fluid collection. Disc levels: L1-2: Negative L2-3: Negative L3-4: Negative L4-5: Minimal left foraminal disc protrusion without neural impingement. Spinal canal normal in size L5-S1: Negative IMPRESSION: Negative for spinal infection lumbar spine Minimal left foraminal disc protrusion L4-5 without neural compression. Electronically Signed   By: Franchot Gallo M.D.   On: 08/18/2020 10:13   CT Abdomen Pelvis W Contrast  Result Date: 08/16/2020 CLINICAL DATA:  45 year old male with abdominal pain. History of IV drug use and osteomyelitis. EXAM: CT ABDOMEN AND PELVIS WITH CONTRAST TECHNIQUE: Multidetector CT imaging of the abdomen and pelvis was performed using the standard protocol following bolus administration of intravenous contrast. CONTRAST:  182m OMNIPAQUE IOHEXOL 300 MG/ML  SOLN COMPARISON:  None. FINDINGS: Lower chest: Several scattered bilateral pulmonary nodules suspicious for septic emboli in this patient with history of IV drug use. Clinical correlation is recommended. Chest CT may provide better evaluation if clinically indicated. No intra-abdominal free air or free fluid. Hepatobiliary: No focal liver abnormality is seen. No gallstones, gallbladder wall thickening, or biliary dilatation. Pancreas: Unremarkable. No pancreatic ductal dilatation or surrounding inflammatory changes. Spleen: Normal in size without focal abnormality. Adrenals/Urinary Tract: The adrenal glands unremarkable. The  kidneys, visualized ureters,  and urinary bladder appear unremarkable. Stomach/Bowel: Postsurgical changes of Nissen fundoplication. Mild thickened appearance of the rectosigmoid, likely related to underdistention. Active inflammation is less likely. There is no bowel obstruction. The appendix is not visualized with certainty. No inflammatory changes identified in the right lower quadrant. Vascular/Lymphatic: Mild atherosclerotic calcification of the abdominal aorta. The IVC is unremarkable. No portal venous gas. There is no adenopathy. There is mild diffuse mesenteric stranding. Reproductive: The prostate and seminal vesicles are grossly unremarkable. Other: None Musculoskeletal: No acute or significant osseous findings. IMPRESSION: 1. Several scattered bilateral pulmonary nodules suspicious for septic emboli. 2. Mild thickened appearance of the rectosigmoid, likely related to underdistention. 3. No bowel obstruction. 4. Aortic Atherosclerosis (ICD10-I70.0). Electronically Signed   By: Anner Crete M.D.   On: 08/16/2020 20:10   DG Chest Port 1 View  Result Date: 08/16/2020 CLINICAL DATA:  Questionable sepsis EXAM: PORTABLE CHEST 1 VIEW COMPARISON:  12/25/2018 FINDINGS: Heart and mediastinal contours are within normal limits. No focal opacities or effusions. No acute bony abnormality. IMPRESSION: No active disease. Electronically Signed   By: Rolm Baptise M.D.   On: 08/16/2020 19:02   ECHOCARDIOGRAM COMPLETE  Result Date: 08/17/2020    ECHOCARDIOGRAM REPORT   Patient Name:   Logan Fisher Date of Exam: 08/17/2020 Medical Rec #:  [id number]      Height:       77.0 in Accession #:    [id number]     Weight:       185.2 lb Date of Birth:  10-Jan-1976      BSA:          2.165 m Patient Age:    51 years       BP:           140/95 mmHg Patient Gender: M              HR:           88 bpm. Exam Location:  Inpatient Procedure: 2D Echo, Cardiac Doppler and Color Doppler Indications:    Pulmonary embolus  History:         Patient has no prior history of Echocardiogram examinations.                 Risk Factors:Hypertension.  Sonographer:    [place] Referring Phys: [id number] JAN A [place]  1. Left ventricular ejection fraction, by estimation, is 50 to 55%. The left ventricle has low normal function. The left ventricle has no regional wall motion abnormalities. Left ventricular diastolic parameters are consistent with Grade I diastolic dysfunction (impaired relaxation).  2. Right ventricular systolic function is normal. The right ventricular size is normal. Tricuspid regurgitation signal is inadequate for assessing PA pressure.  3. The mitral valve is normal in structure. Trivial mitral valve regurgitation. No evidence of mitral stenosis.  4. The aortic valve is tricuspid. Aortic valve regurgitation is not visualized. No aortic stenosis is present.  5. The inferior vena cava is normal in size with greater than 50% respiratory variability, suggesting right atrial pressure of 3 mmHg. FINDINGS  Left Ventricle: Left ventricular ejection fraction, by estimation, is 50 to 55%. The left ventricle has low normal function. The left ventricle has no regional wall motion abnormalities. The left ventricular internal cavity size was normal in size. There is no left ventricular hypertrophy. Left ventricular diastolic parameters are consistent with Grade I diastolic dysfunction (impaired relaxation). Right Ventricle: The right ventricular size is normal. Right ventricular systolic function is  normal. Tricuspid regurgitation signal is inadequate for assessing PA pressure. The tricuspid regurgitant velocity is 1.95 m/s, and with an assumed right atrial  pressure of 3 mmHg, the estimated right ventricular systolic pressure is 62.1 mmHg. Left Atrium: Left atrial size was normal in size. Right Atrium: Right atrial size was normal in size. Pericardium: There is no evidence of pericardial effusion. Mitral Valve: The mitral valve is  normal in structure. Trivial mitral valve regurgitation. No evidence of mitral valve stenosis. MV peak gradient, 3.4 mmHg. The mean mitral valve gradient is 2.0 mmHg. Tricuspid Valve: The tricuspid valve is normal in structure. Tricuspid valve regurgitation is trivial. No evidence of tricuspid stenosis. Aortic Valve: The aortic valve is tricuspid. Aortic valve regurgitation is not visualized. No aortic stenosis is present. Aortic valve mean gradient measures 4.0 mmHg. Aortic valve peak gradient measures 7.3 mmHg. Aortic valve area, by VTI measures 4.78 cm. Pulmonic Valve: The pulmonic valve was normal in structure. Pulmonic valve regurgitation is not visualized. No evidence of pulmonic stenosis. Aorta: The aortic root is normal in size and structure. Venous: The inferior vena cava is normal in size with greater than 50% respiratory variability, suggesting right atrial pressure of 3 mmHg. IAS/Shunts: No atrial level shunt detected by color flow Doppler.  LEFT VENTRICLE PLAX 2D LVIDd:         5.20 cm      Diastology LVIDs:         4.00 cm      LV e' medial:    6.09 cm/s LV PW:         1.10 cm      LV E/e' medial:  13.8 LV IVS:        0.70 cm      LV e' lateral:   14.10 cm/s LVOT diam:     2.70 cm      LV E/e' lateral: 6.0 LV SV:         124 LV SV Index:   57 LVOT Area:     5.73 cm  LV Volumes (MOD) LV vol d, MOD A2C: 110.0 ml LV vol d, MOD A4C: 131.0 ml LV vol s, MOD A2C: 69.5 ml LV vol s, MOD A4C: 72.8 ml LV SV MOD A2C:     40.5 ml LV SV MOD A4C:     131.0 ml LV SV MOD BP:      48.2 ml RIGHT VENTRICLE RV Basal diam:  3.50 cm RV Mid diam:    2.80 cm RV S prime:     19.10 cm/s TAPSE (M-mode): 2.2 cm LEFT ATRIUM             Index       RIGHT ATRIUM           Index LA diam:        3.60 cm 1.66 cm/m  RA Area:     12.60 cm LA Vol (A2C):   48.0 ml 22.17 ml/m RA Volume:   30.70 ml  14.18 ml/m LA Vol (A4C):   65.3 ml 30.16 ml/m LA Biplane Vol: 56.9 ml 26.28 ml/m  AORTIC VALVE                   PULMONIC VALVE AV Area  (Vmax):    5.22 cm    PV Vmax:       0.95 m/s AV Area (Vmean):   4.77 cm    PV Vmean:      68.800 cm/s AV Area (VTI):  4.78 cm    PV VTI:        0.176 m AV Vmax:           135.00 cm/s PV Peak grad:  3.6 mmHg AV Vmean:          96.000 cm/s PV Mean grad:  2.0 mmHg AV VTI:            0.260 m AV Peak Grad:      7.3 mmHg AV Mean Grad:      4.0 mmHg LVOT Vmax:         123.00 cm/s LVOT Vmean:        79.900 cm/s LVOT VTI:          0.217 m LVOT/AV VTI ratio: 0.83  AORTA Ao Root diam: 3.30 cm Ao Asc diam:  3.20 cm MITRAL VALVE               TRICUSPID VALVE MV Area (PHT): 4.71 cm    TR Peak grad:   15.2 mmHg MV Area VTI:   5.89 cm    TR Vmax:        195.00 cm/s MV Peak grad:  3.4 mmHg MV Mean grad:  2.0 mmHg    SHUNTS MV Vmax:       0.92 m/s    Systemic VTI:  0.22 m MV Vmean:      65.2 cm/s   Systemic Diam: 2.70 cm MV Decel Time: 161 msec MV E velocity: 84.20 cm/s MV A velocity: 86.40 cm/s MV E/A ratio:  0.97 Kirk Ruths MD Electronically signed by Kirk Ruths MD Signature Date/Time: 08/17/2020/2:32:39 PM    Final         Scheduled Meds:  enoxaparin (LOVENOX) injection  40 mg Subcutaneous Q24H   Continuous Infusions:  sodium chloride 10 mL/hr at 08/17/20 1851   ceFEPime (MAXIPIME) IV 2 g (08/18/20 0940)     LOS: 1 day    Time spent: 35 minutes     Tyshana Nishida A Rodnesha Elie, MD [facility]   If 7PM-7AM, please contact night-coverage [email or web address]  08/18/2020, 4:37 PM

## 2020-08-18 NOTE — Plan of Care (Signed)
  Problem: Clinical Measurements: Goal: Will remain free from infection Outcome: Progressing Goal: Diagnostic test results will improve Outcome: Progressing   Problem: Activity: Goal: Risk for activity intolerance will decrease Outcome: Progressing   Problem: Nutrition: Goal: Adequate nutrition will be maintained Outcome: Progressing   Problem: Coping: Goal: Level of anxiety will decrease Outcome: Progressing   Problem: Pain Managment: Goal: General experience of comfort will improve Outcome: Progressing   

## 2020-08-18 NOTE — Progress Notes (Signed)
[facility]    Date of Admission:  08/16/2020   Total days of antibiotics 3           ID: Logan Fisher is a 45 y.o. male with  serratia bacteremia with septic pulm emboli, and  Active Problems:   Septic embolism (HCC)    Subjective: underwent MRI that excluded spinal infection but has  Minimal left foraminal disc protrusion L4-5 without neural compression.  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 He appears well-developed and well-nourished. No distress.  HENT:  Mouth/Throat: Oropharynx is clear and moist. No oropharyngeal exudate.  Cardiovascular: Normal rate, regular rhythm and normal heart sounds. Exam reveals no gallop and no friction rub.  No murmur heard.  Pulmonary/Chest: Effort normal and breath sounds normal. No respiratory distress. He has no wheezes.  Abdominal: Soft. Bowel sounds are normal. He exhibits no distension. There is no tenderness.  Lymphadenopathy:  He has no cervical adenopathy.  Neurological: He is alert and oriented to person, place, and time.  Skin: Skin is warm and dry. No rash noted. No erythema.  Psychiatric: He has a normal mood and affect. His behavior is normal.   Lab Results Recent Labs    08/17/20 0123 08/18/20 0950  WBC 16.0* 12.3*  HGB 12.4* 12.4*  HCT 37.6* 38.0*  NA 136 137  K 3.6 3.6  CL 102 104  CO2 24 26  BUN 6 7  CREATININE 0.78 0.75   Liver Panel Recent Labs    08/16/20 1812  PROT 7.3  ALBUMIN 3.1*  AST 24   ALT 16  ALKPHOS 88  BILITOT 0.7   Sedimentation Rate Recent Labs    08/17/20 1144  ESRSEDRATE 57*   C-Reactive Protein Recent Labs    08/17/20 1144  CRP 11.6*    Microbiology: serratia Studies/Results: MR LUMBAR SPINE WO CONTRAST  Result Date: 08/18/2020 CLINICAL DATA:  Low back pain rule out infection. History of IV drug abuse. EXAM: MRI LUMBAR SPINE WITHOUT CONTRAST TECHNIQUE: Multiplanar, multisequence MR imaging of the lumbar spine was performed. No intravenous contrast was administered. COMPARISON:  None. FINDINGS: Segmentation:  Normal Alignment:  Normal Vertebrae: Negative for fracture or mass. No bone marrow edema. No evidence of spinal infection. Conus medullaris and cauda equina: Conus extends to the L1-2 level. Conus and cauda equina appear normal. Paraspinal and other soft tissues: Negative for paraspinous mass, adenopathy, or fluid collection. Disc levels: L1-2: Negative L2-3: Negative L3-4: Negative L4-5: Minimal left foraminal disc protrusion without neural impingement. Spinal canal normal in size L5-S1: Negative IMPRESSION: Negative for spinal infection lumbar spine Minimal left foraminal disc protrusion L4-5 without neural compression. Electronically Signed   By: Marlan Palau M.D.   On: 08/18/2020 10:13   CT Abdomen Pelvis W Contrast  Result Date: 08/16/2020 CLINICAL DATA:  45 year old male with abdominal pain. History of IV drug use and osteomyelitis. EXAM: CT ABDOMEN AND PELVIS WITH CONTRAST TECHNIQUE: Multidetector CT imaging of the abdomen and  pelvis was performed using the standard protocol following bolus administration of intravenous contrast. CONTRAST:  OMNIPAQUE IOHEXOL 300 MG/ML  SOLN COMPARISON:  None. FINDINGS: Lower chest: Several scattered bilateral pulmonary nodules suspicious for septic emboli in this patient with history of IV drug use. Clinical correlation is recommended. Chest CT may provide better evaluation if clinically indicated. No  intra-abdominal free air or free fluid. Hepatobiliary: No focal liver abnormality is seen. No gallstones, gallbladder wall thickening, or biliary dilatation. Pancreas: Unremarkable. No pancreatic ductal dilatation or surrounding inflammatory changes. Spleen: Normal in size without focal abnormality. Adrenals/Urinary Tract: The adrenal glands unremarkable. The kidneys, visualized ureters, and urinary bladder appear unremarkable. Stomach/Bowel: Postsurgical changes of Nissen fundoplication. Mild thickened appearance of the rectosigmoid, likely related to underdistention. Active inflammation is less likely. There is no bowel obstruction. The appendix is not visualized with certainty. No inflammatory changes identified in the right lower quadrant. Vascular/Lymphatic: Mild atherosclerotic calcification of the abdominal aorta. The IVC is unremarkable. No portal venous gas. There is no adenopathy. There is mild diffuse mesenteric stranding. Reproductive: The prostate and seminal vesicles are grossly unremarkable. Other: None Musculoskeletal: No acute or significant osseous findings. IMPRESSION: 1. Several scattered bilateral pulmonary nodules suspicious for septic emboli. 2. Mild thickened appearance of the rectosigmoid, likely related to underdistention. 3. No bowel obstruction. 4. Aortic Atherosclerosis (ICD10-I70.0). Electronically Signed   By: Elgie Collard M.D.   On: 08/16/2020 20:10   DG Chest Port 1 View  Result Date: 08/16/2020 CLINICAL DATA:  Questionable sepsis EXAM: PORTABLE CHEST 1 VIEW COMPARISON:  12/25/2018 FINDINGS: Heart and mediastinal contours are within normal limits. No focal opacities or effusions. No acute bony abnormality. IMPRESSION: No active disease. Electronically Signed   By: Charlett Nose M.D.   On: 08/16/2020 19:02   ECHOCARDIOGRAM COMPLETE  Result Date: 08/17/2020    ECHOCARDIOGRAM REPORT   Patient Name:   Logan Fisher Date of Exam: 08/17/2020 Medical Rec #:  [id number]       Height:       77.0 in Accession #:    [id number]     Weight:       185.2 lb Date of Birth:  May 11, 1975      BSA:          2.165 m Patient Age:    45 years       BP:           140/95 mmHg Patient Gender: M              HR:           88 bpm. Exam Location:  Inpatient Procedure: 2D Echo, Cardiac Doppler and Color Doppler Indications:    Pulmonary embolus  History:        Patient has no prior history of Echocardiogram examinations.                 Risk Factors:Hypertension.  Sonographer:    Neomia Dear RDCS Referring Phys: [id number] JAN A MANSY IMPRESSIONS  1. Left ventricular ejection fraction, by estimation, is 50 to 55%. The left ventricle has low normal function. The left ventricle has no regional wall motion abnormalities. Left ventricular diastolic parameters are consistent with Grade I diastolic dysfunction (impaired relaxation).  2. Right ventricular systolic function is normal. The right ventricular size is normal. Tricuspid regurgitation signal is inadequate for assessing PA pressure.  3. The mitral valve is normal in structure. Trivial mitral valve regurgitation. No evidence of mitral stenosis.  4. The aortic  valve is tricuspid. Aortic valve regurgitation is not visualized. No aortic stenosis is present.  5. The inferior vena cava is normal in size with greater than 50% respiratory variability, suggesting right atrial pressure of 3 mmHg. FINDINGS  Left Ventricle: Left ventricular ejection fraction, by estimation, is 50 to 55%. The left ventricle has low normal function. The left ventricle has no regional wall motion abnormalities. The left ventricular internal cavity size was normal in size. There is no left ventricular hypertrophy. Left ventricular diastolic parameters are consistent with Grade I diastolic dysfunction (impaired relaxation). Right Ventricle: The right ventricular size is normal. Right ventricular systolic function is normal. Tricuspid regurgitation signal is inadequate for assessing PA  pressure. The tricuspid regurgitant velocity is 1.95 m/s, and with an assumed right atrial  pressure of 3 mmHg, the estimated right ventricular systolic pressure is 18.2 mmHg. Left Atrium: Left atrial size was normal in size. Right Atrium: Right atrial size was normal in size. Pericardium: There is no evidence of pericardial effusion. Mitral Valve: The mitral valve is normal in structure. Trivial mitral valve regurgitation. No evidence of mitral valve stenosis. MV peak gradient, 3.4 mmHg. The mean mitral valve gradient is 2.0 mmHg. Tricuspid Valve: The tricuspid valve is normal in structure. Tricuspid valve regurgitation is trivial. No evidence of tricuspid stenosis. Aortic Valve: The aortic valve is tricuspid. Aortic valve regurgitation is not visualized. No aortic stenosis is present. Aortic valve mean gradient measures 4.0 mmHg. Aortic valve peak gradient measures 7.3 mmHg. Aortic valve area, by VTI measures 4.78 cm. Pulmonic Valve: The pulmonic valve was normal in structure. Pulmonic valve regurgitation is not visualized. No evidence of pulmonic stenosis. Aorta: The aortic root is normal in size and structure. Venous: The inferior vena cava is normal in size with greater than 50% respiratory variability, suggesting right atrial pressure of 3 mmHg. IAS/Shunts: No atrial level shunt detected by color flow Doppler.  LEFT VENTRICLE PLAX 2D LVIDd:         5.20 cm      Diastology LVIDs:         4.00 cm      LV e' medial:    6.09 cm/s LV PW:         1.10 cm      LV E/e' medial:  13.8 LV IVS:        0.70 cm      LV e' lateral:   14.10 cm/s LVOT diam:     2.70 cm      LV E/e' lateral: 6.0 LV SV:         124 LV SV Index:   57 LVOT Area:     5.73 cm  LV Volumes (MOD) LV vol d, MOD A2C: 110.0 ml LV vol d, MOD A4C: 131.0 ml LV vol s, MOD A2C: 69.5 ml LV vol s, MOD A4C: 72.8 ml LV SV MOD A2C:     40.5 ml LV SV MOD A4C:     131.0 ml LV SV MOD BP:      48.2 ml RIGHT VENTRICLE RV Basal diam:  3.50 cm RV Mid diam:    2.80 cm RV  S prime:     19.10 cm/s TAPSE (M-mode): 2.2 cm LEFT ATRIUM             Index       RIGHT ATRIUM           Index LA diam:        3.60 cm 1.66 cm/m  RA Area:     12.60 cm LA Vol (A2C):   48.0 ml 22.17 ml/m RA Volume:   30.70 ml  14.18 ml/m LA Vol (A4C):   65.3 ml 30.16 ml/m LA Biplane Vol: 56.9 ml 26.28 ml/m  AORTIC VALVE                   PULMONIC VALVE AV Area (Vmax):    5.22 cm    PV Vmax:       0.95 m/s AV Area (Vmean):   4.77 cm    PV Vmean:      68.800 cm/s AV Area (VTI):     4.78 cm    PV VTI:        0.176 m AV Vmax:           135.00 cm/s PV Peak grad:  3.6 mmHg AV Vmean:          96.000 cm/s PV Mean grad:  2.0 mmHg AV VTI:            0.260 m AV Peak Grad:      7.3 mmHg AV Mean Grad:      4.0 mmHg LVOT Vmax:         123.00 cm/s LVOT Vmean:        79.900 cm/s LVOT VTI:          0.217 m LVOT/AV VTI ratio: 0.83  AORTA Ao Root diam: 3.30 cm Ao Asc diam:  3.20 cm MITRAL VALVE               TRICUSPID VALVE MV Area (PHT): 4.71 cm    TR Peak grad:   15.2 mmHg MV Area VTI:   5.89 cm    TR Vmax:        195.00 cm/s MV Peak grad:  3.4 mmHg MV Mean grad:  2.0 mmHg    SHUNTS MV Vmax:       0.92 m/s    Systemic VTI:  0.22 m MV Vmean:      65.2 cm/s   Systemic Diam: 2.70 cm MV Decel Time: 161 msec MV E velocity: 84.20 cm/s MV A velocity: 86.40 cm/s MV E/A ratio:  0.97 Olga Millers MD Electronically signed by Olga Millers MD Signature Date/Time: 08/17/2020/2:32:39 PM    Final      Assessment/Plan: Back pain = not infectious, musculoskeletal. 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 Recommend TEE. Will continue on cefepime. 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

## 2020-08-19 LAB — CULTURE, BLOOD (ROUTINE X 2): Special Requests: ADEQUATE

## 2020-08-19 LAB — CBC
HCT: 40.3 % (ref 39.0–52.0)
Hemoglobin: 12.9 g/dL — ABNORMAL LOW (ref 13.0–17.0)
MCH: 25.4 pg — ABNORMAL LOW (ref 26.0–34.0)
MCHC: 32 g/dL (ref 30.0–36.0)
MCV: 79.3 fL — ABNORMAL LOW (ref 80.0–100.0)
Platelets: 513 10*3/uL — ABNORMAL HIGH (ref 150–400)
RBC: 5.08 MIL/uL (ref 4.22–5.81)
RDW: 14.5 % (ref 11.5–15.5)
WBC: 11.4 10*3/uL — ABNORMAL HIGH (ref 4.0–10.5)
nRBC: 0 % (ref 0.0–0.2)

## 2020-08-19 LAB — BASIC METABOLIC PANEL
Anion gap: 7 (ref 5–15)
BUN: 7 mg/dL (ref 6–20)
CO2: 26 mmol/L (ref 22–32)
Calcium: 8.6 mg/dL — ABNORMAL LOW (ref 8.9–10.3)
Chloride: 105 mmol/L (ref 98–111)
Creatinine, Ser: 0.71 mg/dL (ref 0.61–1.24)
GFR, Estimated: >60 mL/min (ref >60)
Glucose, Bld: 141 mg/dL — ABNORMAL HIGH (ref 70–99)
Potassium: 3.9 mmol/L (ref 3.5–5.1)
Sodium: 138 mmol/L (ref 135–145)

## 2020-08-19 MED ORDER — LIDOCAINE 5 % EX PTCH
1.0000 | MEDICATED_PATCH | CUTANEOUS | Status: DC
  Administered 2020-08-19 – 2020-08-21 (×3): 1 via TRANSDERMAL
  Filled 2020-08-19 (×3): qty 1

## 2020-08-19 MED ORDER — IBUPROFEN 200 MG PO TABS
400.0000 mg | ORAL_TABLET | Freq: Four times a day (QID) | ORAL | Status: DC | PRN
  Administered 2020-08-19 – 2020-08-20 (×2): 400 mg via ORAL
  Filled 2020-08-19 (×2): qty 2

## 2020-08-19 MED ORDER — DICLOFENAC SODIUM 1 % EX GEL
4.0000 g | Freq: Four times a day (QID) | CUTANEOUS | Status: DC
  Administered 2020-08-19 – 2020-08-21 (×5): 4 g via TOPICAL
  Filled 2020-08-19: qty 100

## 2020-08-19 MED ORDER — METHOCARBAMOL 500 MG PO TABS
750.0000 mg | ORAL_TABLET | Freq: Four times a day (QID) | ORAL | Status: DC | PRN
  Administered 2020-08-19 – 2020-08-20 (×3): 750 mg via ORAL
  Filled 2020-08-19 (×3): qty 2

## 2020-08-19 NOTE — Progress Notes (Signed)
PROGRESS NOTE    Logan Fisher  KVQ:[id number] DOB: [birth year] DOA: 08/16/2020 PCP: Heywood Bene, PA-C   Brief Narrative: 45 year old with past medical history significant for hypertension, dyslipidemia hiatal hernia IV drug abuse who presents to the emergency room complaining of acute onset of excessive fatigue and tiredness associated with back pain as well as with dry cough and dyspnea without wheezing.  He reports fevers. Evaluation in the ER: Leukocytosis white count of 14, COVID-19 PCR negative, chest x-ray no acute cardiopulmonary disease.  CT abdomen and pelvis: Several scattered bilateral pulmonary nodules suspicious for septic emboli.  Mild thickening appearance of the rectosigmoid likely related to underdistention.   Assessment & Plan:   Active Problems:   Septic embolism (HCC)  1-Septic Lung Emboli: Serratia Marcescens Bacteremia;  -Blood cultures; Serratia Marcescens  -2D echo  normal EF, diastolic dysfunction grade one.  -IV antibiotics cefepime. Vancomycin discontinue.  -ESR and CRP elevated. -ID consulted, plan for TEE. Cardiology consulted. Hopefully TEE on Friday.  -MRI lumbar spine negative for discitis.  -Discussed with ID, we might be able to do oral Levaquin for Serratia. He will need 6 weeks for pulmonary septic emboli.   2-HTN As needed labetalol  3-Anxiety: On as needed Xanax  4-Back pain: MRI negative for infection.  On baclofen. Start Ibuprofen. Start Lidoderm Patch.    Estimated body mass index is 21.96 kg/m as calculated from the following:   Height as of this encounter: 6' 5"  (1.956 m).   Weight as of this encounter: 84 kg.   DVT prophylaxis: Lovenox Code Status: Full code Family Communication: care discussed with patient.  Disposition Plan:  Status is: Inpatient  Remains inpatient appropriate because:Ongoing active pain requiring inpatient pain management and IV treatments appropriate due to intensity of illness or inability to  take PO  Dispo: The patient is from: Home              Anticipated d/c is to: Home              Patient currently is not medically stable to d/c.   Difficult to place patient No        Consultants:  None  Procedures:  ECHO; pending  Antimicrobials:    Subjective: Complaining of back pain, baclofen helps.  Plan to get PT eval. Out of bed.    Objective: Vitals:   08/18/20 1239 08/18/20 2145 08/19/20 0527 08/19/20 1253  BP: (!) 136/93 122/81 131/82 (!) 139/103  Pulse: 84 88 79 92  Resp: 16 20 18 16   Temp: 97.6 F (36.4 C) 98.5 F (36.9 C) 98.1 F (36.7 C) 98.6 F (37 C)  TempSrc: Oral Oral Oral Oral  SpO2: 100% 99% 100% 90%  Weight:      Height:        Intake/Output Summary (Last 24 hours) at 08/19/2020 1515 Last data filed at 08/19/2020 1400 Gross per 24 hour  Intake 1904.9 ml  Output 901 ml  Net 1003.9 ml    Filed Weights   08/16/20 1638  Weight: 84 kg    Examination:  General exam: NAD Respiratory system: CTA Cardiovascular system:  S 1, S 2 RRR Gastrointestinal system: BS present, soft, nt Central nervous system: Non focal.  Extremities: Symmetric power.    Data Reviewed: I have personally reviewed following labs and imaging studies  CBC: Recent Labs  Lab 08/16/20 1812 08/17/20 0123 08/18/20 0950 08/19/20 0745  WBC 14.9* 16.0* 12.3* 11.4*  NEUTROABS 11.9*  --   --   --  HGB 12.6* 12.4* 12.4* 12.9*  HCT 37.6* 37.6* 38.0* 40.3  MCV 76.3* 78.3* 78.8* 79.3*  PLT 451* 430* 456* 513*    Basic Metabolic Panel: Recent Labs  Lab 08/16/20 1812 08/17/20 0123 08/18/20 0950 08/19/20 0745  NA 136 136 137 138  K 4.2 3.6 3.6 3.9  CL 103 102 104 105  CO2 25 24 26 26   GLUCOSE 112* 116* 145* 141*  BUN 7 6 7 7   CREATININE 0.69 0.78 0.75 0.71  CALCIUM 8.6* 8.9 8.5* 8.6*    GFR: Estimated Creatinine Clearance: 135.6 mL/min (by C-G formula based on SCr of 0.71 mg/dL). Liver Function Tests: Recent Labs  Lab 08/16/20 1812  AST 24  ALT  16  ALKPHOS 88  BILITOT 0.7  PROT 7.3  ALBUMIN 3.1*    No results for input(s): LIPASE, AMYLASE in the last 168 hours. No results for input(s): AMMONIA in the last 168 hours. Coagulation Profile: Recent Labs  Lab 08/16/20 1833 08/17/20 0123  INR 1.0 1.1    Cardiac Enzymes: No results for input(s): CKTOTAL, CKMB, CKMBINDEX, TROPONINI in the last 168 hours. BNP (last 3 results) No results for input(s): PROBNP in the last 8760 hours. HbA1C: No results for input(s): HGBA1C in the last 72 hours. CBG: No results for input(s): GLUCAP in the last 168 hours. Lipid Profile: Recent Labs    08/17/20 0123  CHOL 119  HDL 24*  LDLCALC 82  TRIG 65  CHOLHDL 5.0    Thyroid Function Tests: No results for input(s): TSH, T4TOTAL, FREET4, T3FREE, THYROIDAB in the last 72 hours. Anemia Panel: No results for input(s): VITAMINB12, FOLATE, FERRITIN, TIBC, IRON, RETICCTPCT in the last 72 hours. Sepsis Labs: Recent Labs  Lab 08/16/20 1812 08/17/20 0123  PROCALCITON  --  0.59  LATICACIDVEN 1.2 0.7     Recent Results (from the past 240 hour(s))  Resp Panel by RT-PCR (Flu A&B, Covid) Nasopharyngeal Swab     Status: None   Collection Time: 08/16/20  6:12 PM   Specimen: Nasopharyngeal Swab; Nasopharyngeal(NP) swabs in vial transport medium  Result Value Ref Range Status   SARS Coronavirus 2 by RT PCR NEGATIVE NEGATIVE Final    Comment: (NOTE) SARS-CoV-2 target nucleic acids are NOT DETECTED.  The SARS-CoV-2 RNA is generally detectable in upper respiratory specimens during the acute phase of infection. The lowest concentration of SARS-CoV-2 viral copies this assay can detect is 138 copies/mL. A negative result does not preclude SARS-Cov-2 infection and should not be used as the sole basis for treatment or other patient management decisions. A negative result may occur with  improper specimen collection/handling, submission of specimen other than nasopharyngeal swab, presence of viral  mutation(s) within the areas targeted by this assay, and inadequate number of viral copies(<138 copies/mL). A negative result must be combined with clinical observations, patient history, and epidemiological information. The expected result is Negative.  Fact Sheet for Patients:  EntrepreneurPulse.com.au  Fact Sheet for Healthcare Providers:  IncredibleEmployment.be  This test is no t yet approved or cleared by the [place] FDA and  has been authorized for detection and/or diagnosis of SARS-CoV-2 by FDA under an Emergency Use Authorization (EUA). This EUA will remain  in effect (meaning this test can be used) for the duration of the COVID-19 declaration under Section 564(b)(1) of the Act, 21 U.S.C.section 360bbb-3(b)(1), unless the authorization is terminated  or revoked sooner.       Influenza A by PCR NEGATIVE NEGATIVE Final   Influenza B by PCR NEGATIVE  NEGATIVE Final    Comment: (NOTE) The Xpert Xpress SARS-CoV-2/FLU/RSV plus assay is intended as an aid in the diagnosis of influenza from Nasopharyngeal swab specimens and should not be used as a sole basis for treatment. Nasal washings and aspirates are unacceptable for Xpert Xpress SARS-CoV-2/FLU/RSV testing.  Fact Sheet for Patients: EntrepreneurPulse.com.au  Fact Sheet for Healthcare Providers: IncredibleEmployment.be  This test is not yet approved or cleared by the [place] FDA and has been authorized for detection and/or diagnosis of SARS-CoV-2 by FDA under an Emergency Use Authorization (EUA). This EUA will remain in effect (meaning this test can be used) for the duration of the COVID-19 declaration under Section 564(b)(1) of the Act, 21 U.S.C. section 360bbb-3(b)(1), unless the authorization is terminated or revoked.  Performed at [facility], [address], [place], [place] [address]   Urine culture     Status: None    Collection Time: 08/16/20  6:12 PM   Specimen: In/Out Cath Urine  Result Value Ref Range Status   Specimen Description   Final    IN/OUT CATH URINE Performed at [facility], [address] [place], [place] [address]    Special Requests   Final    NONE Performed at [facility], [address] [place], [place] [address]    Culture   Final    NO GROWTH Performed at [facility], [address], [place], [place] [address]    Report Status 08/18/2020 FINAL  Final  Blood Culture (routine x 2)     Status: Abnormal   Collection Time: 08/16/20  6:33 PM   Specimen: BLOOD  Result Value Ref Range Status   Specimen Description   Final    BLOOD RIGHT ANTECUBITAL Performed at [facility], [address] [place], [place] [address]    Special Requests   Final    BOTTLES DRAWN AEROBIC AND ANAEROBIC Blood Culture adequate volume Performed at [facility], [address] [place], [place] [address]    Culture  Setup Time   Final    GRAM NEGATIVE RODS IN BOTH AEROBIC AND ANAEROBIC BOTTLES Organism ID to follow CRITICAL RESULT CALLED TO, READ BACK BY AND VERIFIED WITH: PHARMD MCHELLE BELL AT 9509 ON 08/17/20 BY KJ Performed at [facility], [place] [address] [place], [place] [address]    Culture SERRATIA MARCESCENS (A)  Final   Report Status 08/19/2020 FINAL  Final   Organism ID, Bacteria SERRATIA MARCESCENS  Final      Susceptibility   Serratia marcescens - MIC*    CEFAZOLIN >=64 RESISTANT Resistant     CEFEPIME <=0.12 SENSITIVE Sensitive     CEFTAZIDIME <=1 SENSITIVE Sensitive     CEFTRIAXONE <=0.25 SENSITIVE Sensitive     CIPROFLOXACIN <=0.25 SENSITIVE Sensitive     GENTAMICIN <=1 SENSITIVE Sensitive     TRIMETH/SULFA <=20 SENSITIVE Sensitive     * SERRATIA MARCESCENS  Blood Culture ID Panel (Reflexed)     Status: Abnormal   Collection Time: 08/16/20  6:33 PM  Result Value Ref Range Status   Enterococcus faecalis NOT  DETECTED NOT DETECTED Final   Enterococcus Faecium NOT DETECTED NOT DETECTED Final   Listeria monocytogenes NOT DETECTED NOT DETECTED Final   Staphylococcus species NOT DETECTED NOT DETECTED Final   Staphylococcus aureus (BCID) NOT DETECTED NOT DETECTED Final   Staphylococcus epidermidis NOT DETECTED NOT DETECTED Final   Staphylococcus lugdunensis NOT DETECTED  NOT DETECTED Final   Streptococcus species NOT DETECTED NOT DETECTED Final   Streptococcus agalactiae NOT DETECTED NOT DETECTED Final   Streptococcus pneumoniae NOT DETECTED NOT DETECTED Final   Streptococcus pyogenes NOT DETECTED NOT DETECTED Final   A.calcoaceticus-baumannii NOT DETECTED NOT DETECTED Final   Bacteroides fragilis NOT DETECTED NOT DETECTED Final   Enterobacterales DETECTED (A) NOT DETECTED Final    Comment: Enterobacterales represent a large order of gram negative bacteria, not a single organism. CRITICAL RESULT CALLED TO, READ BACK BY AND VERIFIED WITH: PHARMD MICHELLE BELL AT 1440 ON 08/17/20 BY KJ    Enterobacter cloacae complex NOT DETECTED NOT DETECTED Final   Escherichia coli NOT DETECTED NOT DETECTED Final   Klebsiella aerogenes NOT DETECTED NOT DETECTED Final   Klebsiella oxytoca NOT DETECTED NOT DETECTED Final   Klebsiella pneumoniae NOT DETECTED NOT DETECTED Final   Proteus species NOT DETECTED NOT DETECTED Final   Salmonella species NOT DETECTED NOT DETECTED Final   Serratia marcescens DETECTED (A) NOT DETECTED Final    Comment: CRITICAL RESULT CALLED TO, READ BACK BY AND VERIFIED WITH: PHARMD MICHELLE BELL  AT 1439 ON 08/17/20 BY KJ    Haemophilus influenzae NOT DETECTED NOT DETECTED Final   Neisseria meningitidis NOT DETECTED NOT DETECTED Final   Pseudomonas aeruginosa NOT DETECTED NOT DETECTED Final   Stenotrophomonas maltophilia NOT DETECTED NOT DETECTED Final   Candida albicans NOT DETECTED NOT DETECTED Final   Candida auris NOT DETECTED NOT DETECTED Final   Candida glabrata NOT DETECTED NOT  DETECTED Final   Candida krusei NOT DETECTED NOT DETECTED Final   Candida parapsilosis NOT DETECTED NOT DETECTED Final   Candida tropicalis NOT DETECTED NOT DETECTED Final   Cryptococcus neoformans/gattii NOT DETECTED NOT DETECTED Final   CTX-M ESBL NOT DETECTED NOT DETECTED Final   Carbapenem resistance IMP NOT DETECTED NOT DETECTED Final   Carbapenem resistance KPC NOT DETECTED NOT DETECTED Final   Carbapenem resistance NDM NOT DETECTED NOT DETECTED Final   Carbapenem resist OXA 48 LIKE NOT DETECTED NOT DETECTED Final   Carbapenem resistance VIM NOT DETECTED NOT DETECTED Final    Comment: Performed at [facility], [address] [place], [place] [address]  Blood Culture (routine x 2)     Status: Abnormal   Collection Time: 08/17/20  1:23 AM   Specimen: BLOOD  Result Value Ref Range Status   Specimen Description   Final    BLOOD RIGHT ARM Performed at [facility] [address], [place], [place] [address]    Special Requests   Final    BOTTLES DRAWN AEROBIC ONLY Blood Culture results may not be optimal due to an excessive volume of blood received in culture bottles Performed at [facility] [address], [place], [place] [address]    Culture  Setup Time   Final    Organism ID to follow AEROBIC BOTTLE ONLY GRAM NEGATIVE RODS CRITICAL RESULT CALLED TO, READ BACK BY AND VERIFIED WITH: Cristopher Estimable RN 2112 08/17/20 A BROWNING    Culture (A)  Final    SERRATIA MARCESCENS SUSCEPTIBILITIES PERFORMED ON PREVIOUS CULTURE WITHIN THE LAST 5 DAYS. Performed at [facility], [place] [address], [place], [place] [address]    Report Status 08/19/2020 FINAL  Final  Blood Culture ID Panel (Reflexed)     Status: Abnormal   Collection Time: 08/17/20  1:23 AM  Result Value Ref Range Status   Enterococcus faecalis NOT DETECTED NOT DETECTED Final  Enterococcus Faecium NOT DETECTED NOT DETECTED Final   Listeria monocytogenes NOT DETECTED NOT  DETECTED Final   Staphylococcus species NOT DETECTED NOT DETECTED Final   Staphylococcus aureus (BCID) NOT DETECTED NOT DETECTED Final   Staphylococcus epidermidis NOT DETECTED NOT DETECTED Final   Staphylococcus lugdunensis NOT DETECTED NOT DETECTED Final   Streptococcus species NOT DETECTED NOT DETECTED Final   Streptococcus agalactiae NOT DETECTED NOT DETECTED Final   Streptococcus pneumoniae NOT DETECTED NOT DETECTED Final   Streptococcus pyogenes NOT DETECTED NOT DETECTED Final   A.calcoaceticus-baumannii NOT DETECTED NOT DETECTED Final   Bacteroides fragilis NOT DETECTED NOT DETECTED Final   Enterobacterales DETECTED (A) NOT DETECTED Final    Comment: Enterobacterales represent a large order of gram negative bacteria, not a single organism. CRITICAL RESULT CALLED TO, READ BACK BY AND VERIFIED WITH: E WILLIAMSON PHARMD 2112 08/17/20 A BROWNING    Enterobacter cloacae complex NOT DETECTED NOT DETECTED Final   Escherichia coli NOT DETECTED NOT DETECTED Final   Klebsiella aerogenes NOT DETECTED NOT DETECTED Final   Klebsiella oxytoca NOT DETECTED NOT DETECTED Final   Klebsiella pneumoniae NOT DETECTED NOT DETECTED Final   Proteus species NOT DETECTED NOT DETECTED Final   Salmonella species NOT DETECTED NOT DETECTED Final   Serratia marcescens DETECTED (A) NOT DETECTED Final    Comment: CRITICAL RESULT CALLED TO, READ BACK BY AND VERIFIED WITH: Cristopher Estimable PHARMD 2112 08/17/20 A BROWNING    Haemophilus influenzae NOT DETECTED NOT DETECTED Final   Neisseria meningitidis NOT DETECTED NOT DETECTED Final   Pseudomonas aeruginosa NOT DETECTED NOT DETECTED Final   Stenotrophomonas maltophilia NOT DETECTED NOT DETECTED Final   Candida albicans NOT DETECTED NOT DETECTED Final   Candida auris NOT DETECTED NOT DETECTED Final   Candida glabrata NOT DETECTED NOT DETECTED Final   Candida krusei NOT DETECTED NOT DETECTED Final   Candida parapsilosis NOT DETECTED NOT DETECTED Final   Candida  tropicalis NOT DETECTED NOT DETECTED Final   Cryptococcus neoformans/gattii NOT DETECTED NOT DETECTED Final   CTX-M ESBL NOT DETECTED NOT DETECTED Final   Carbapenem resistance IMP NOT DETECTED NOT DETECTED Final   Carbapenem resistance KPC NOT DETECTED NOT DETECTED Final   Carbapenem resistance NDM NOT DETECTED NOT DETECTED Final   Carbapenem resist OXA 48 LIKE NOT DETECTED NOT DETECTED Final   Carbapenem resistance VIM NOT DETECTED NOT DETECTED Final    Comment: Performed at [facility], [address] [place], [place] [address]          Radiology Studies: MR LUMBAR SPINE WO CONTRAST  Result Date: 08/18/2020 CLINICAL DATA:  Low back pain rule out infection. History of IV drug abuse. EXAM: MRI LUMBAR SPINE WITHOUT CONTRAST TECHNIQUE: Multiplanar, multisequence MR imaging of the lumbar spine was performed. No intravenous contrast was administered. COMPARISON:  None. FINDINGS: Segmentation:  Normal Alignment:  Normal Vertebrae: Negative for fracture or mass. No bone marrow edema. No evidence of spinal infection. Conus medullaris and cauda equina: Conus extends to the L1-2 level. Conus and cauda equina appear normal. Paraspinal and other soft tissues: Negative for paraspinous mass, adenopathy, or fluid collection. Disc levels: L1-2: Negative L2-3: Negative L3-4: Negative L4-5: Minimal left foraminal disc protrusion without neural impingement. Spinal canal normal in size L5-S1: Negative IMPRESSION: Negative for spinal infection lumbar spine Minimal left foraminal disc protrusion L4-5 without neural compression. Electronically Signed   By: Franchot Gallo M.D.   On: 08/18/2020 10:13        Scheduled Meds:  enoxaparin (LOVENOX)  injection  40 mg Subcutaneous Q24H   lidocaine  1 patch Transdermal Q24H   Continuous Infusions:  sodium chloride 100 mL/hr at 08/19/20 1029   ceFEPime (MAXIPIME) IV 2 g (08/19/20 0917)     LOS: 2 days    Time spent: 35 minutes     Tina Temme A Miguel Christiana,  MD [facility]   If 7PM-7AM, please contact night-coverage [email or web address]  08/19/2020, 3:15 PM

## 2020-08-19 NOTE — TOC Initial Note (Signed)
Transition of Care [facility]) - Initial/Assessment Note    Patient Details  Name: Logan Fisher MRN: [id number] Date of Birth: 1975-07-24  Transition of Care [facility]) CM/SW Contact:    Lanier Clam, RN Phone Number: 08/19/2020, 2:42 PM  Clinical Narrative: From home. YB:OFBP.                  Expected Discharge Plan: Home/Self Care Barriers to Discharge: Continued Medical Work up   Patient Goals and CMS Choice Patient states their goals for this hospitalization and ongoing recovery are:: go home CMS Medicare.gov Compare Post Acute Care list provided to:: Patient Choice offered to / list presented to : Patient  Expected Discharge Plan and Services Expected Discharge Plan: Home/Self Care   Discharge Planning Services: CM Consult   Living arrangements for the past 2 months: Apartment                                      Prior Living Arrangements/Services Living arrangements for the past 2 months: Apartment Lives with:: Friends Patient language and need for interpreter reviewed:: Yes Do you feel safe going back to the place where you live?: Yes      Need for Family Participation in Patient Care: No (Comment) Care giver support system in place?: Yes (comment)   Criminal Activity/Legal Involvement Pertinent to Current Situation/Hospitalization: No - Comment as needed  Activities of Daily Living Home Assistive Devices/Equipment: Eyeglasses ADL Screening (condition at time of admission) Patient's cognitive ability adequate to safely complete daily activities?: Yes Is the patient deaf or have difficulty hearing?: No Does the patient have difficulty seeing, even when wearing glasses/contacts?: No Does the patient have difficulty concentrating, remembering, or making decisions?: No Patient able to express need for assistance with ADLs?: Yes Does the patient have difficulty dressing or bathing?: No Independently performs ADLs?: Yes (appropriate for developmental age) Does  the patient have difficulty walking or climbing stairs?: No Weakness of Legs: None Weakness of Arms/Hands: None  Permission Sought/Granted Permission sought to share information with : Case Manager Permission granted to share information with : Yes, Verbal Permission Granted  Share Information with NAME: Case manager           Emotional Assessment Appearance:: Appears stated age Attitude/Demeanor/Rapport: Gracious Affect (typically observed): Accepting Orientation: : Oriented to Self, Oriented to Place, Oriented to  Time, Oriented to Situation Alcohol / Substance Use: Illicit Drugs Psych Involvement: No (comment)  Admission diagnosis:  Septic embolism [facility]) [I76] Patient Active Problem List   Diagnosis Date Noted   Septic embolism (HCC) 08/16/2020   AC (acromioclavicular) joint bone spurs 08/16/2020   Cellulitis 08/16/2020   Drug abuse (HCC) 08/16/2020   Hypercholesteremia 08/16/2020   Hiatal hernia 08/16/2020   Septic arthritis (HCC) 12/25/2018   IV drug abuse (HCC) 12/25/2018   Hypokalemia 12/25/2018   PCP:  Roger Kill, PA-C Pharmacy:   [id number] [place], [place] - [address] AT [facility] OF [address] & [address] [place] [address] Phone: [phone] Fax: [phone]  [facility] [address] [place] [address] Phone: [phone] Fax: [phone]  [id number] - [place], [place] - [address] [place] [address] Phone: [phone] Fax: [phone]     Social Determinants of Health (SDOH) Interventions  Readmission Risk Interventions No flowsheet data found.   

## 2020-08-19 NOTE — Progress Notes (Signed)
[facility]    Date of Admission:  08/16/2020   Total days of antibiotics 4          ID: Logan Fisher is a 45 y.o. 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 Active Problems:   Septic embolism (HCC)    Subjective: Afebrile, back pain improved with some of medication but still difficulty with pain with movement  Medications:   enoxaparin (LOVENOX) injection  40 mg Subcutaneous Q24H   lidocaine  1 patch Transdermal Q24H    Objective: Vital signs in last 24 hours: Temp:  [97.6 F (36.4 C)-98.5 F (36.9 C)] 98.1 F (36.7 C) (06/16 0527) Pulse Rate:  [79-88] 79 (06/16 0527) Resp:  [16-20] 18 (06/16 0527) BP: (122-136)/(81-93) 131/82 (06/16 0527) SpO2:  [99 %-100 %] 100 % (06/16 0527)  Physical Exam  Constitutional: He is oriented to person, place, and time. He appears well-developed and well-nourished. No distress.  HENT:  Mouth/Throat: Oropharynx is clear and moist. No oropharyngeal exudate.  Cardiovascular: Normal rate, regular rhythm and normal heart sounds. Exam reveals no gallop and no friction rub.  No murmur heard.  Pulmonary/Chest: Effort normal and breath sounds normal. No respiratory distress. He has no wheezes.  Lymphadenopathy:  He has no cervical adenopathy.  Neurological: He is alert and oriented to person, place, and time.  Skin: Skin is warm and dry. Scarring of arms from prior injection sites Psychiatric: He has a normal mood and affect. His behavior is normal.    Lab Results Recent Labs    08/17/20 0123 08/18/20 0950 08/19/20 0745  WBC 16.0* 12.3* 11.4*  HGB 12.4* 12.4* 12.9*  HCT 37.6* 38.0* 40.3  NA 136 137  --   K 3.6 3.6  --   CL 102 104  --   CO2 24 26  --   BUN 6 7  --   CREATININE 0.78 0.75  --    Liver Panel Recent Labs    08/16/20 1812  PROT 7.3  ALBUMIN 3.1*  AST 24  ALT 16  ALKPHOS 88  BILITOT 0.7   Sedimentation Rate Recent Labs    08/17/20 1144   ESRSEDRATE 57*   C-Reactive Protein Recent Labs    08/17/20 1144  CRP 11.6*    Microbiology: Serratia marcescens      MIC    CEFAZOLIN >=64 RESIST... Resistant    CEFEPIME <=0.12 SENS... Sensitive    CEFTAZIDIME <=1 SENSITIVE  Sensitive    CEFTRIAXONE <=0.25 SENS... Sensitive    CIPROFLOXACIN <=0.25 SENS... Sensitive    GENTAMICIN <=1 SENSITIVE  Sensitive    TRIMETH/SULFA <=20 SENSIT... Sensitive    Studies/Results: MR LUMBAR SPINE WO CONTRAST  Result Date: 08/18/2020 CLINICAL DATA:  Low back pain rule out infection. History of IV drug abuse. EXAM: MRI LUMBAR SPINE WITHOUT CONTRAST TECHNIQUE: Multiplanar, multisequence MR imaging of the lumbar spine was performed. No intravenous contrast was administered. COMPARISON:  None. FINDINGS: Segmentation:  Normal Alignment:  Normal Vertebrae: Negative for fracture or mass. No bone marrow edema. No evidence of spinal infection. Conus medullaris and cauda equina: Conus extends to the L1-2 level. Conus and cauda equina appear normal. Paraspinal and other soft tissues: Negative for paraspinous mass, adenopathy, or fluid collection. Disc levels: L1-2: Negative L2-3: Negative L3-4: Negative L4-5: Minimal left foraminal disc protrusion without neural impingement. Spinal canal normal in size L5-S1: Negative IMPRESSION: Negative for spinal infection lumbar spine Minimal left foraminal disc protrusion L4-5 without  neural compression. Electronically Signed   By: Marlan Palau M.D.   On: 08/18/2020 10:13   ECHOCARDIOGRAM COMPLETE  Result Date: 08/17/2020    ECHOCARDIOGRAM REPORT   Patient Name:   Logan Fisher Date of Exam: 08/17/2020 Medical Rec #:  [id number]      Height:       77.0 in Accession #:    [id number]     Weight:       185.2 lb Date of Birth:  [birth year]      BSA:          2.165 m Patient Age:    45 years       BP:           140/95 mmHg Patient Gender: M              HR:           88 bpm. Exam Location:  Inpatient Procedure: 2D Echo, Cardiac  Doppler and Color Doppler Indications:    Pulmonary embolus  History:        Patient has no prior history of Echocardiogram examinations.                 Risk Factors:Hypertension.  Sonographer:    Neomia Dear RDCS Referring Phys: [id number] JAN A MANSY IMPRESSIONS  1. Left ventricular ejection fraction, by estimation, is 50 to 55%. The left ventricle has low normal function. The left ventricle has no regional wall motion abnormalities. Left ventricular diastolic parameters are consistent with Grade I diastolic dysfunction (impaired relaxation).  2. Right ventricular systolic function is normal. The right ventricular size is normal. Tricuspid regurgitation signal is inadequate for assessing PA pressure.  3. The mitral valve is normal in structure. Trivial mitral valve regurgitation. No evidence of mitral stenosis.  4. The aortic valve is tricuspid. Aortic valve regurgitation is not visualized. No aortic stenosis is present.  5. The inferior vena cava is normal in size with greater than 50% respiratory variability, suggesting right atrial pressure of 3 mmHg. FINDINGS  Left Ventricle: Left ventricular ejection fraction, by estimation, is 50 to 55%. The left ventricle has low normal function. The left ventricle has no regional wall motion abnormalities. The left ventricular internal cavity size was normal in size. There is no left ventricular hypertrophy. Left ventricular diastolic parameters are consistent with Grade I diastolic dysfunction (impaired relaxation). Right Ventricle: The right ventricular size is normal. Right ventricular systolic function is normal. Tricuspid regurgitation signal is inadequate for assessing PA pressure. The tricuspid regurgitant velocity is 1.95 m/s, and with an assumed right atrial  pressure of 3 mmHg, the estimated right ventricular systolic pressure is 18.2 mmHg. Left Atrium: Left atrial size was normal in size. Right Atrium: Right atrial size was normal in size. Pericardium: There is  no evidence of pericardial effusion. Mitral Valve: The mitral valve is normal in structure. Trivial mitral valve regurgitation. No evidence of mitral valve stenosis. MV peak gradient, 3.4 mmHg. The mean mitral valve gradient is 2.0 mmHg. Tricuspid Valve: The tricuspid valve is normal in structure. Tricuspid valve regurgitation is trivial. No evidence of tricuspid stenosis. Aortic Valve: The aortic valve is tricuspid. Aortic valve regurgitation is not visualized. No aortic stenosis is present. Aortic valve mean gradient measures 4.0 mmHg. Aortic valve peak gradient measures 7.3 mmHg. Aortic valve area, by VTI measures 4.78 cm. Pulmonic Valve: The pulmonic valve was normal in structure. Pulmonic valve regurgitation is not visualized. No evidence of pulmonic stenosis. Aorta: The aortic root  is normal in size and structure. Venous: The inferior vena cava is normal in size with greater than 50% respiratory variability, suggesting right atrial pressure of 3 mmHg. IAS/Shunts: No atrial level shunt detected by color flow Doppler.  LEFT VENTRICLE PLAX 2D LVIDd:         5.20 cm      Diastology LVIDs:         4.00 cm      LV e' medial:    6.09 cm/s LV PW:         1.10 cm      LV E/e' medial:  13.8 LV IVS:        0.70 cm      LV e' lateral:   14.10 cm/s LVOT diam:     2.70 cm      LV E/e' lateral: 6.0 LV SV:         124 LV SV Index:   57 LVOT Area:     5.73 cm  LV Volumes (MOD) LV vol d, MOD A2C: 110.0 ml LV vol d, MOD A4C: 131.0 ml LV vol s, MOD A2C: 69.5 ml LV vol s, MOD A4C: 72.8 ml LV SV MOD A2C:     40.5 ml LV SV MOD A4C:     131.0 ml LV SV MOD BP:      48.2 ml RIGHT VENTRICLE RV Basal diam:  3.50 cm RV Mid diam:    2.80 cm RV S prime:     19.10 cm/s TAPSE (M-mode): 2.2 cm LEFT ATRIUM             Index       RIGHT ATRIUM           Index LA diam:        3.60 cm 1.66 cm/m  RA Area:     12.60 cm LA Vol (A2C):   48.0 ml 22.17 ml/m RA Volume:   30.70 ml  14.18 ml/m LA Vol (A4C):   65.3 ml 30.16 ml/m LA Biplane Vol: 56.9  ml 26.28 ml/m  AORTIC VALVE                   PULMONIC VALVE AV Area (Vmax):    5.22 cm    PV Vmax:       0.95 m/s AV Area (Vmean):   4.77 cm    PV Vmean:      68.800 cm/s AV Area (VTI):     4.78 cm    PV VTI:        0.176 m AV Vmax:           135.00 cm/s PV Peak grad:  3.6 mmHg AV Vmean:          96.000 cm/s PV Mean grad:  2.0 mmHg AV VTI:            0.260 m AV Peak Grad:      7.3 mmHg AV Mean Grad:      4.0 mmHg LVOT Vmax:         123.00 cm/s LVOT Vmean:        79.900 cm/s LVOT VTI:          0.217 m LVOT/AV VTI ratio: 0.83  AORTA Ao Root diam: 3.30 cm Ao Asc diam:  3.20 cm MITRAL VALVE               TRICUSPID VALVE MV Area (PHT): 4.71 cm    TR Peak grad:   15.2 mmHg  MV Area VTI:   5.89 cm    TR Vmax:        195.00 cm/s MV Peak grad:  3.4 mmHg MV Mean grad:  2.0 mmHg    SHUNTS MV Vmax:       0.92 m/s    Systemic VTI:  0.22 m MV Vmean:      65.2 cm/s   Systemic Diam: 2.70 cm MV Decel Time: 161 msec MV E velocity: 84.20 cm/s MV A velocity: 86.40 cm/s MV E/A ratio:  0.97 Olga MillersBrian Crenshaw MD Electronically signed by Olga MillersBrian Crenshaw MD Signature Date/Time: 08/17/2020/2:32:39 PM    Final      Assessment/Plan: 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 Serratia isolate is sensitive to FQ, we will be able to transition to oral abtx such as once a day levofloxacin x 6 wk.  Pulm septic emboli = will be treated with levofloxacin  Leukocytosis = improving with treatment of underlying infection  Back spasm = did not find spinal infection which is helpful. Plan on treating with muscle relaxant, increase dose to see if better controlled.  [facility]yn[facility] Cell: [phone] Pager: [phone]  08/19/2020, 10:03 AM

## 2020-08-19 NOTE — Progress Notes (Signed)
    [facility] has been requested to perform a transesophageal echocardiogram on this patient for bacteremia, to exclude endocarditis. After careful review of history and examination, the risks and benefits of transesophageal echocardiogram have been explained including risks of esophageal damage, perforation (1:10,000 risk), bleeding, pharyngeal hematoma as well as other potential complications associated sedation including aspiration, arrhythmia, respiratory failure and death. Alternatives to treatment were discussed, questions were answered. He did inquire whether OK to proceed given prior Nissen fundoplication in 2000 at outside hospital. Discussed with DOD Dr. Anne Fu who reviewed with GI - per their recommendations, he can stay mid esophagus and if he needs to go transgastric it is fine as long as no extra pressure needed. Dr. Anne Fu plans to relay to Dr. Rennis Golden as well. Patient is willing to proceed. This is scheduled for tomorrow at 11:30 with Dr. Rennis Golden at [facility]. Orders written including NPO after midnight except sips with meds.  Laurann Montana, PA-C 08/19/2020 1:36 PM

## 2020-08-19 NOTE — Progress Notes (Signed)
Patient states it is okay to call his fiance, Jonell Cluck, at [phone] after the TEE to let her know the results.

## 2020-08-19 NOTE — Progress Notes (Signed)
Spoke with pt's RN, Florentina Addison, who is aware that the pt is scheduled for a TEE tomorrow 08/20/20 at [facility]. Transport via [employer] up with [employer]. Weston Settle, RN

## 2020-08-20 ENCOUNTER — Encounter (HOSPITAL_COMMUNITY): Payer: Self-pay | Admitting: Internal Medicine

## 2020-08-20 ENCOUNTER — Inpatient Hospital Stay (HOSPITAL_COMMUNITY): Payer: Self-pay | Admitting: Anesthesiology

## 2020-08-20 ENCOUNTER — Inpatient Hospital Stay (HOSPITAL_COMMUNITY): Payer: Self-pay

## 2020-08-20 ENCOUNTER — Encounter (HOSPITAL_COMMUNITY)
Admission: EM | Disposition: A | Discharge status: Home / Self Care | Payer: Self-pay | Source: Home / Self Care | Attending: Internal Medicine

## 2020-08-20 DIAGNOSIS — R7881 Bacteremia: Secondary | ICD-10-CM

## 2020-08-20 HISTORY — PX: BUBBLE STUDY: SHX6837

## 2020-08-20 HISTORY — PX: TEE WITHOUT CARDIOVERSION: SHX5443

## 2020-08-20 LAB — URINE CYTOLOGY ANCILLARY ONLY
Chlamydia: NEGATIVE
Comment: NEGATIVE
Comment: NORMAL
Neisseria Gonorrhea: NEGATIVE

## 2020-08-20 SURGERY — ECHOCARDIOGRAM, TRANSESOPHAGEAL
Anesthesia: Monitor Anesthesia Care

## 2020-08-20 MED ORDER — LIDOCAINE 2% (20 MG/ML) 5 ML SYRINGE
INTRAMUSCULAR | Status: DC | PRN
  Administered 2020-08-20: 80 mg via INTRAVENOUS

## 2020-08-20 MED ORDER — PROPOFOL 10 MG/ML IV BOLUS
INTRAVENOUS | Status: DC | PRN
  Administered 2020-08-20: 50 mg via INTRAVENOUS
  Administered 2020-08-20: 25 mg via INTRAVENOUS

## 2020-08-20 MED ORDER — PROPOFOL 500 MG/50ML IV EMUL
INTRAVENOUS | Status: DC | PRN
  Administered 2020-08-20 (×2): 200 ug/kg/min via INTRAVENOUS

## 2020-08-20 MED ORDER — HYDROMORPHONE HCL 1 MG/ML IJ SOLN
INTRAMUSCULAR | Status: AC
  Filled 2020-08-20: qty 2

## 2020-08-20 MED ORDER — BUTAMBEN-TETRACAINE-BENZOCAINE 2-2-14 % EX AERO
INHALATION_SPRAY | CUTANEOUS | Status: DC | PRN
  Administered 2020-08-20: 2 via TOPICAL

## 2020-08-20 NOTE — Anesthesia Postprocedure Evaluation (Signed)
Anesthesia Post Note  Patient: Logan Fisher  Procedure(s) Performed: TRANSESOPHAGEAL ECHOCARDIOGRAM (TEE)     Patient location during evaluation: PACU Anesthesia Type: MAC Level of consciousness: awake and alert Pain management: pain level controlled Vital Signs Assessment: post-procedure vital signs reviewed and stable Respiratory status: spontaneous breathing, nonlabored ventilation and respiratory function stable Cardiovascular status: stable and blood pressure returned to baseline Postop Assessment: no apparent nausea or vomiting Anesthetic complications: no   No notable events documented.  Last Vitals:  Vitals:   08/20/20 1250 08/20/20 1259  BP: 114/78 121/82  Pulse: 94   Resp: 16 12  Temp:    SpO2: 100%     Last Pain:  Vitals:   08/20/20 1239  TempSrc: Axillary  PainSc: Asleep                 Brynlei Klausner A.

## 2020-08-20 NOTE — Progress Notes (Signed)
PT Cancellation Note  Patient Details Name: CANDON CARAS MRN: [id number] DOB: 1975/09/15   Cancelled Treatment:    Reason Eval/Treat Not Completed: Patient at procedure or test/unavailable [facility] for TEE)   Soul Deveney,KATHrine E 08/20/2020, 11:05 AM Paulino Door, DPT Acute Rehabilitation Services Pager: [phone] Office: [phone]

## 2020-08-20 NOTE — CV Procedure (Signed)
TRANSESOPHAGEAL ECHOCARDIOGRAM (TEE) NOTE  INDICATIONS: infective endocarditis  PROCEDURE:   Informed consent was obtained prior to the procedure. The risks, benefits and alternatives for the procedure were discussed and the patient comprehended these risks.  Risks include, but are not limited to, cough, sore throat, vomiting, nausea, somnolence, esophageal and stomach trauma or perforation, bleeding, low blood pressure, aspiration, pneumonia, infection, trauma to the teeth and death.    After a procedural time-out, the patient was given propofol per anesthesia for sedation.  The patient's heart rate, blood pressure, and oxygen saturation are monitored continuously during the procedure.The oropharynx was anesthetized with 2 topical cetacaine sprays.  The transesophageal probe was inserted in the esophagus and stomach without difficulty and multiple views were obtained.  The patient was kept under observation until the patient left the procedure room.  I was present face-to-face 100% of this time. The patient left the procedure room in stable condition.   Agitated microbubble saline contrast was administered.  COMPLICATIONS:    There were no immediate complications.  Findings:  LEFT VENTRICLE: The left ventricular wall thickness is normal.  The left ventricular cavity is normal in size. Wall motion is normal.  LVEF is 50-55%.  RIGHT VENTRICLE:  The right ventricle is normal in structure and function without any thrombus or masses.    LEFT ATRIUM:  The left atrium is normal in size without any thrombus or masses.  There is not spontaneous echo contrast ("smoke") in the left atrium consistent with a low flow state.  LEFT ATRIAL APPENDAGE:  The left atrial appendage is free of any thrombus or masses. The appendage has single lobes. Pulse doppler indicates high flow in the appendage.  ATRIAL SEPTUM:  The atrial septum appears intact and is free of thrombus and/or masses.  There is no  evidence for interatrial shunting by color doppler and saline microbubble.  RIGHT ATRIUM:  The right atrium is normal in size and function without any thrombus or masses.  MITRAL VALVE:  The mitral valve demonstrates elongated leaflet tips, but no prolapse. There is normal function with  trivial  regurgitation.  There were no vegetations or stenosis.  AORTIC VALVE:  The aortic valve is trileaflet, normal in structure and function with  no  regurgitation.  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 There is prolapse of the septal leaflet. The leaflets do appears somewhat thickened, possibly suggestive of recent vegetation, but no obvious mobile mass or vegetation was noted. There is  trivial  regurgitation.   PULMONIC VALVE:  The pulmonic valve demonstrates elongated leaflet tips with  trivial  regurgitation.  There were no vegetations or stenosis.   AORTIC ARCH, ASCENDING AND DESCENDING AORTA:  There was no Myrtis Ser et. Al, 1992) atherosclerosis of the ascending aorta, aortic arch, or proximal descending aorta.  12. PULMONARY VEINS: Anomalous pulmonary venous return was not noted.  13. PERICARDIUM: The pericardium appeared normal and non-thickened.  There is no pericardial effusion.  IMPRESSION:   No obvious endocarditis, however, the tricuspid valve appears somewhat thickened and there is prolpase of the septal leaflet, probably congenital. Cannot exclude recent endocarditis. Abnormal leaflet tip elongation of the mitral and pulmonic valves. 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 This would increased risk  of valve seeding - cannot exclude recent TV endocarditis, however, there is little valve insufficiency.  Time Spent  Directly with the Patient:  60 minutes   Chrystie Nose, MD, [facility], FACP  [place]  [facility]  Medical Director of the [facility]  [facility] Diplomate of the American Board of Clinical Lipidology Attending Cardiologist  Direct Dial: [phone]  Fax: [phone]  Website:  [email or web address] 08/20/2020, 12:35 PM

## 2020-08-20 NOTE — Transfer of Care (Signed)
Immediate Anesthesia Transfer of Care Note  Patient: Logan Fisher  Procedure(s) Performed: TRANSESOPHAGEAL ECHOCARDIOGRAM (TEE)  Patient Location: Endoscopy Unit  Anesthesia Type:MAC  Level of Consciousness: awake, alert  and oriented  Airway & Oxygen Therapy: Patient Spontanous Breathing and Patient connected to nasal cannula oxygen  Post-op Assessment: Report given to RN and Post -op Vital signs reviewed and stable  Post vital signs: Reviewed and stable  Last Vitals:  Vitals Value Taken Time  BP    Temp    Pulse    Resp    SpO2      Last Pain:  Vitals:   08/20/20 1129  TempSrc: Oral  PainSc:       Patients Stated Pain Goal: 2 (54/00/86 7619)  Complications: No notable events documented.

## 2020-08-20 NOTE — Progress Notes (Signed)
Pharmacy Antibiotic Note  Logan Fisher is a 45 y.o. male admitted on 08/16/2020 with fever and body aches.  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 ID recommending 6 weeks of therapy.  Plan: Continue cefepime 2g IV Q8h Monitor clinical picture, renal function F/U abx deescalation / LOT  Consider transition to Levaquin 750mg  PO daily to complete total of 6 weeks when stable for discharge  Height: 6\' 5"  (195.6 cm) Weight: 84 kg (185 lb 3 oz) IBW/kg (Calculated) : 89.1  Temp (24hrs), Avg:98.3 F (36.8 C), Min:97.7 F (36.5 C), Max:98.6 F (37 C)  Recent Labs  Lab 08/16/20 1812 08/17/20 0123 08/18/20 0950 08/19/20 0745  WBC 14.9* 16.0* 12.3* 11.4*  CREATININE 0.69 0.78 0.75 0.71  LATICACIDVEN 1.2 0.7  --   --      Estimated Creatinine Clearance: 135.6 mL/min (by C-G formula based on SCr of 0.71 mg/dL).    No Known Allergies  Thank you for allowing pharmacy to be a part of this patient's care.  08/20/20, PharmD, BCPS, BCIDP Clinical Pharmacist 08/20/2020 7:52 AM

## 2020-08-20 NOTE — Progress Notes (Signed)
Patient received back from TEE procedure at [facility] : [facility] transported via [facility].  Pt stable, requesting food and resting comfortably at this time.  Will continue to monitor.

## 2020-08-20 NOTE — Anesthesia Preprocedure Evaluation (Signed)
Anesthesia Evaluation  Patient identified by MRN, date of birth, ID band Patient awake    Reviewed: Allergy & Precautions, NPO status , Patient's Chart, lab work & pertinent test results  Airway Mallampati: III  TM Distance: >3 FB Neck ROM: Full    Dental  (+) Poor Dentition, Dental Advisory Given   Pulmonary Current Smoker and Patient abstained from smoking.,    Pulmonary exam normal breath sounds clear to auscultation       Cardiovascular hypertension, Normal cardiovascular exam Rhythm:Regular Rate:Normal     Neuro/Psych negative neurological ROS  negative psych ROS   GI/Hepatic hiatal hernia, (+)     substance abuse  IV drug use,   Endo/Other  Hyperglycemia  Renal/GU   negative genitourinary   Musculoskeletal  (+) Arthritis ,   Abdominal   Peds  Hematology  (+) anemia , Bacteremia   Anesthesia Other Findings   Reproductive/Obstetrics                             Anesthesia Physical Anesthesia Plan  ASA: 3  Anesthesia Plan: MAC   Post-op Pain Management:    Induction: Intravenous  PONV Risk Score and Plan: 2 and Propofol infusion and Treatment may vary due to age or medical condition  Airway Management Planned: Natural Airway and Nasal Cannula  Additional Equipment:   Intra-op Plan:   Post-operative Plan:   Informed Consent: I have reviewed the patients History and Physical, chart, labs and discussed the procedure including the risks, benefits and alternatives for the proposed anesthesia with the patient or authorized representative who has indicated his/her understanding and acceptance.     Dental advisory given  Plan Discussed with: CRNA and Anesthesiologist  Anesthesia Plan Comments:         Anesthesia Quick Evaluation

## 2020-08-20 NOTE — Anesthesia Procedure Notes (Signed)
Procedure Name: MAC Date/Time: 08/20/2020 12:16 PM Performed by: Imagene Riches, CRNA Pre-anesthesia Checklist: Patient identified, Emergency Drugs available, Suction available, Patient being monitored and Timeout performed Patient Re-evaluated:Patient Re-evaluated prior to induction Oxygen Delivery Method: Nasal cannula

## 2020-08-20 NOTE — Progress Notes (Signed)
PROGRESS NOTE    Logan Fisher  SWH:[id number] DOB: September 28, 1975 DOA: 08/16/2020 PCP: Heywood Bene, PA-C   Brief Narrative: 45 year old with past medical history significant for hypertension, dyslipidemia hiatal hernia IV drug abuse who presents to the emergency room complaining of acute onset of excessive fatigue and tiredness associated with back pain as well as with dry cough and dyspnea without wheezing.  He reports fevers. Evaluation in the ER: Leukocytosis white count of 14, COVID-19 PCR negative, chest x-ray no acute cardiopulmonary disease.  CT abdomen and pelvis: Several scattered bilateral pulmonary nodules suspicious for septic emboli.  Mild thickening appearance of the rectosigmoid likely related to underdistention.   Assessment & Plan:   Active Problems:   Septic embolism (HCC)  1-Septic Lung Emboli: Serratia Marcescens Bacteremia;  -Blood cultures; Serratia Marcescens  -2D echo  normal EF, diastolic dysfunction grade one.  -IV antibiotics cefepime. Vancomycin discontinue.  -ESR and CRP elevated. -TEE; No obvious endocarditis, but tricuspid Valve appears somewhat Thickened and there is prolapse of the septal leaflet, probably congenital. Can not exclude recent endocarditis.  -MRI lumbar spine negative for discitis.  -Discussed with ID, we might be able to do oral Levaquin for Serratia. He will need 6 weeks for pulmonary septic emboli.  -Awaiting ID recommendation regarding antibiotics following TEE results.   2-HTN As needed labetalol  3-Anxiety: On as needed Xanax  4-Back pain: MRI negative for infection.  On baclofen. Start Ibuprofen. Start Lidoderm Patch.    Estimated body mass index is 21.96 kg/m as calculated from the following:   Height as of this encounter: 6' 5"  (1.956 m).   Weight as of this encounter: 84 kg.   DVT prophylaxis: Lovenox Code Status: Full code Family Communication: care discussed with patient.  Disposition Plan:  Status is:  Inpatient  Remains inpatient appropriate because:Ongoing active pain requiring inpatient pain management and IV treatments appropriate due to intensity of illness or inability to take PO  Dispo: The patient is from: Home              Anticipated d/c is to: Home              Patient currently is not medically stable to d/c.   Difficult to place patient No        Consultants:  None  Procedures:  ECHO; pending  Antimicrobials:    Subjective: Back pain better, although got aggravated after travel today for procedure.    Objective: Vitals:   08/20/20 1250 08/20/20 1259 08/20/20 1328 08/20/20 1443  BP: 114/78 121/82 (!) 135/91 130/75  Pulse: 94  84 94  Resp: 16 12 10 18   Temp:    98 F (36.7 C)  TempSrc:    Oral  SpO2: 100%  100% 100%  Weight:      Height:        Intake/Output Summary (Last 24 hours) at 08/20/2020 1618 Last data filed at 08/20/2020 1230 Gross per 24 hour  Intake 2501.11 ml  Output 2000 ml  Net 501.11 ml    Filed Weights   08/16/20 1638  Weight: 84 kg    Examination:  General exam: NAD Respiratory system:CTA Cardiovascular system:  S 1. S 2 RRR Gastrointestinal system: BS present, soft, nt Central nervous system: Non focal  Extremities: Symmetric power   Data Reviewed: I have personally reviewed following labs and imaging studies  CBC: Recent Labs  Lab 08/16/20 1812 08/17/20 0123 08/18/20 0950 08/19/20 0745  WBC 14.9* 16.0* 12.3* 11.4*  NEUTROABS  11.9*  --   --   --   HGB 12.6* 12.4* 12.4* 12.9*  HCT 37.6* 37.6* 38.0* 40.3  MCV 76.3* 78.3* 78.8* 79.3*  PLT 451* 430* 456* 513*    Basic Metabolic Panel: Recent Labs  Lab 08/16/20 1812 08/17/20 0123 08/18/20 0950 08/19/20 0745  NA 136 136 137 138  K 4.2 3.6 3.6 3.9  CL 103 102 104 105  CO2 25 24 26 26   GLUCOSE 112* 116* 145* 141*  BUN 7 6 7 7   CREATININE 0.69 0.78 0.75 0.71  CALCIUM 8.6* 8.9 8.5* 8.6*    GFR: Estimated Creatinine Clearance: 135.6 mL/min (by C-G  formula based on SCr of 0.71 mg/dL). Liver Function Tests: Recent Labs  Lab 08/16/20 1812  AST 24  ALT 16  ALKPHOS 88  BILITOT 0.7  PROT 7.3  ALBUMIN 3.1*    No results for input(s): LIPASE, AMYLASE in the last 168 hours. No results for input(s): AMMONIA in the last 168 hours. Coagulation Profile: Recent Labs  Lab 08/16/20 1833 08/17/20 0123  INR 1.0 1.1    Cardiac Enzymes: No results for input(s): CKTOTAL, CKMB, CKMBINDEX, TROPONINI in the last 168 hours. BNP (last 3 results) No results for input(s): PROBNP in the last 8760 hours. HbA1C: No results for input(s): HGBA1C in the last 72 hours. CBG: No results for input(s): GLUCAP in the last 168 hours. Lipid Profile: No results for input(s): CHOL, HDL, LDLCALC, TRIG, CHOLHDL, LDLDIRECT in the last 72 hours.  Thyroid Function Tests: No results for input(s): TSH, T4TOTAL, FREET4, T3FREE, THYROIDAB in the last 72 hours. Anemia Panel: No results for input(s): VITAMINB12, FOLATE, FERRITIN, TIBC, IRON, RETICCTPCT in the last 72 hours. Sepsis Labs: Recent Labs  Lab 08/16/20 1812 08/17/20 0123  PROCALCITON  --  0.59  LATICACIDVEN 1.2 0.7     Recent Results (from the past 240 hour(s))  Resp Panel by RT-PCR (Flu A&B, Covid) Nasopharyngeal Swab     Status: None   Collection Time: 08/16/20  6:12 PM   Specimen: Nasopharyngeal Swab; Nasopharyngeal(NP) swabs in vial transport medium  Result Value Ref Range Status   SARS Coronavirus 2 by RT PCR NEGATIVE NEGATIVE Final    Comment: (NOTE) SARS-CoV-2 target nucleic acids are NOT DETECTED.  The SARS-CoV-2 RNA is generally detectable in upper respiratory specimens during the acute phase of infection. The lowest concentration of SARS-CoV-2 viral copies this assay can detect is 138 copies/mL. A negative result does not preclude SARS-Cov-2 infection and should not be used as the sole basis for treatment or other patient management decisions. A negative result may occur with   improper specimen collection/handling, submission of specimen other than nasopharyngeal swab, presence of viral mutation(s) within the areas targeted by this assay, and inadequate number of viral copies(<138 copies/mL). A negative result must be combined with clinical observations, patient history, and epidemiological information. The expected result is Negative.  Fact Sheet for Patients:  EntrepreneurPulse.com.au  Fact Sheet for Healthcare Providers:  IncredibleEmployment.be  This test is no t yet approved or cleared by the [place] FDA and  has been authorized for detection and/or diagnosis of SARS-CoV-2 by FDA under an Emergency Use Authorization (EUA). This EUA will remain  in effect (meaning this test can be used) for the duration of the COVID-19 declaration under Section 564(b)(1) of the Act, 21 U.S.C.section 360bbb-3(b)(1), unless the authorization is terminated  or revoked sooner.       Influenza A by PCR NEGATIVE NEGATIVE Final   Influenza B by  PCR NEGATIVE NEGATIVE Final    Comment: (NOTE) The Xpert Xpress SARS-CoV-2/FLU/RSV plus assay is intended as an aid in the diagnosis of influenza from Nasopharyngeal swab specimens and should not be used as a sole basis for treatment. Nasal washings and aspirates are unacceptable for Xpert Xpress SARS-CoV-2/FLU/RSV testing.  Fact Sheet for Patients: EntrepreneurPulse.com.au  Fact Sheet for Healthcare Providers: IncredibleEmployment.be  This test is not yet approved or cleared by the [place] FDA and has been authorized for detection and/or diagnosis of SARS-CoV-2 by FDA under an Emergency Use Authorization (EUA). This EUA will remain in effect (meaning this test can be used) for the duration of the COVID-19 declaration under Section 564(b)(1) of the Act, 21 U.S.C. section 360bbb-3(b)(1), unless the authorization is terminated  or revoked.  Performed at [facility], [address] [place], [place] [address]   Urine culture     Status: None   Collection Time: 08/16/20  6:12 PM   Specimen: In/Out Cath Urine  Result Value Ref Range Status   Specimen Description   Final    IN/OUT CATH URINE Performed at [facility], [address] [place], [place] [address]    Special Requests   Final    NONE Performed at [facility], [address] [place], [place] [address]    Culture   Final    NO GROWTH Performed at [facility], [place] [address], [place], [place] [address]    Report Status 08/18/2020 FINAL  Final  Blood Culture (routine x 2)     Status: Abnormal   Collection Time: 08/16/20  6:33 PM   Specimen: BLOOD  Result Value Ref Range Status   Specimen Description   Final    BLOOD RIGHT ANTECUBITAL Performed at [facility], [address] [place], [place] [address]    Special Requests   Final    BOTTLES DRAWN AEROBIC AND ANAEROBIC Blood Culture adequate volume Performed at [facility], [address] [place], [place] [address]    Culture  Setup Time   Final    GRAM NEGATIVE RODS IN BOTH AEROBIC AND ANAEROBIC BOTTLES Organism ID to follow CRITICAL RESULT CALLED TO, READ BACK BY AND VERIFIED WITH: PHARMD MCHELLE BELL AT 0093 ON 08/17/20 BY KJ Performed at [facility], [place] [address], [place], [place] [address]    Culture SERRATIA MARCESCENS (A)  Final   Report Status 08/19/2020 FINAL  Final   Organism ID, Bacteria SERRATIA MARCESCENS  Final      Susceptibility   Serratia marcescens - MIC*    CEFAZOLIN >=64 RESISTANT Resistant     CEFEPIME <=0.12 SENSITIVE Sensitive     CEFTAZIDIME <=1 SENSITIVE Sensitive     CEFTRIAXONE <=0.25 SENSITIVE Sensitive     CIPROFLOXACIN <=0.25 SENSITIVE Sensitive     GENTAMICIN <=1 SENSITIVE Sensitive     TRIMETH/SULFA <=20 SENSITIVE Sensitive     * SERRATIA MARCESCENS  Blood Culture ID Panel  (Reflexed)     Status: Abnormal   Collection Time: 08/16/20  6:33 PM  Result Value Ref Range Status   Enterococcus faecalis NOT DETECTED NOT DETECTED Final   Enterococcus Faecium NOT DETECTED NOT DETECTED Final   Listeria monocytogenes NOT DETECTED NOT DETECTED Final   Staphylococcus species NOT DETECTED NOT DETECTED Final   Staphylococcus aureus (BCID) NOT DETECTED NOT DETECTED Final   Staphylococcus epidermidis NOT DETECTED NOT DETECTED Final   Staphylococcus lugdunensis  NOT DETECTED NOT DETECTED Final   Streptococcus species NOT DETECTED NOT DETECTED Final   Streptococcus agalactiae NOT DETECTED NOT DETECTED Final   Streptococcus pneumoniae NOT DETECTED NOT DETECTED Final   Streptococcus pyogenes NOT DETECTED NOT DETECTED Final   A.calcoaceticus-baumannii NOT DETECTED NOT DETECTED Final   Bacteroides fragilis NOT DETECTED NOT DETECTED Final   Enterobacterales DETECTED (A) NOT DETECTED Final    Comment: Enterobacterales represent a large order of gram negative bacteria, not a single organism. CRITICAL RESULT CALLED TO, READ BACK BY AND VERIFIED WITH: PHARMD MICHELLE BELL AT 1440 ON 08/17/20 BY KJ    Enterobacter cloacae complex NOT DETECTED NOT DETECTED Final   Escherichia coli NOT DETECTED NOT DETECTED Final   Klebsiella aerogenes NOT DETECTED NOT DETECTED Final   Klebsiella oxytoca NOT DETECTED NOT DETECTED Final   Klebsiella pneumoniae NOT DETECTED NOT DETECTED Final   Proteus species NOT DETECTED NOT DETECTED Final   Salmonella species NOT DETECTED NOT DETECTED Final   Serratia marcescens DETECTED (A) NOT DETECTED Final    Comment: CRITICAL RESULT CALLED TO, READ BACK BY AND VERIFIED WITH: PHARMD MICHELLE BELL  AT 1439 ON 08/17/20 BY KJ    Haemophilus influenzae NOT DETECTED NOT DETECTED Final   Neisseria meningitidis NOT DETECTED NOT DETECTED Final   Pseudomonas aeruginosa NOT DETECTED NOT DETECTED Final   Stenotrophomonas maltophilia NOT DETECTED NOT DETECTED Final   Candida  albicans NOT DETECTED NOT DETECTED Final   Candida auris NOT DETECTED NOT DETECTED Final   Candida glabrata NOT DETECTED NOT DETECTED Final   Candida krusei NOT DETECTED NOT DETECTED Final   Candida parapsilosis NOT DETECTED NOT DETECTED Final   Candida tropicalis NOT DETECTED NOT DETECTED Final   Cryptococcus neoformans/gattii NOT DETECTED NOT DETECTED Final   CTX-M ESBL NOT DETECTED NOT DETECTED Final   Carbapenem resistance IMP NOT DETECTED NOT DETECTED Final   Carbapenem resistance KPC NOT DETECTED NOT DETECTED Final   Carbapenem resistance NDM NOT DETECTED NOT DETECTED Final   Carbapenem resist OXA 48 LIKE NOT DETECTED NOT DETECTED Final   Carbapenem resistance VIM NOT DETECTED NOT DETECTED Final    Comment: Performed at [facility], [address] [place], [place] [address]  Blood Culture (routine x 2)     Status: Abnormal   Collection Time: 08/17/20  1:23 AM   Specimen: BLOOD  Result Value Ref Range Status   Specimen Description   Final    BLOOD RIGHT ARM Performed at [facility] [address] [place], [place] [address]    Special Requests   Final    BOTTLES DRAWN AEROBIC ONLY Blood Culture results may not be optimal due to an excessive volume of blood received in culture bottles Performed at [facility] [address] [place], [place] [address]    Culture  Setup Time   Final    Organism ID to follow AEROBIC BOTTLE ONLY GRAM NEGATIVE RODS CRITICAL RESULT CALLED TO, READ BACK BY AND VERIFIED WITH: Cristopher Estimable RN 2112 08/17/20 A BROWNING    Culture (A)  Final    SERRATIA MARCESCENS SUSCEPTIBILITIES PERFORMED ON PREVIOUS CULTURE WITHIN THE LAST 5 DAYS. Performed at [facility], [place] [address], [place], [place] [address]    Report Status 08/19/2020 FINAL  Final  Blood Culture ID Panel (Reflexed)     Status: Abnormal   Collection Time: 08/17/20  1:23 AM  Result Value Ref Range Status   Enterococcus faecalis NOT  DETECTED NOT DETECTED  Final   Enterococcus Faecium NOT DETECTED NOT DETECTED Final   Listeria monocytogenes NOT DETECTED NOT DETECTED Final   Staphylococcus species NOT DETECTED NOT DETECTED Final   Staphylococcus aureus (BCID) NOT DETECTED NOT DETECTED Final   Staphylococcus epidermidis NOT DETECTED NOT DETECTED Final   Staphylococcus lugdunensis NOT DETECTED NOT DETECTED Final   Streptococcus species NOT DETECTED NOT DETECTED Final   Streptococcus agalactiae NOT DETECTED NOT DETECTED Final   Streptococcus pneumoniae NOT DETECTED NOT DETECTED Final   Streptococcus pyogenes NOT DETECTED NOT DETECTED Final   A.calcoaceticus-baumannii NOT DETECTED NOT DETECTED Final   Bacteroides fragilis NOT DETECTED NOT DETECTED Final   Enterobacterales DETECTED (A) NOT DETECTED Final    Comment: Enterobacterales represent a large order of gram negative bacteria, not a single organism. CRITICAL RESULT CALLED TO, READ BACK BY AND VERIFIED WITH: E WILLIAMSON PHARMD 2112 08/17/20 A BROWNING    Enterobacter cloacae complex NOT DETECTED NOT DETECTED Final   Escherichia coli NOT DETECTED NOT DETECTED Final   Klebsiella aerogenes NOT DETECTED NOT DETECTED Final   Klebsiella oxytoca NOT DETECTED NOT DETECTED Final   Klebsiella pneumoniae NOT DETECTED NOT DETECTED Final   Proteus species NOT DETECTED NOT DETECTED Final   Salmonella species NOT DETECTED NOT DETECTED Final   Serratia marcescens DETECTED (A) NOT DETECTED Final    Comment: CRITICAL RESULT CALLED TO, READ BACK BY AND VERIFIED WITH: Cristopher Estimable PHARMD 2112 08/17/20 A BROWNING    Haemophilus influenzae NOT DETECTED NOT DETECTED Final   Neisseria meningitidis NOT DETECTED NOT DETECTED Final   Pseudomonas aeruginosa NOT DETECTED NOT DETECTED Final   Stenotrophomonas maltophilia NOT DETECTED NOT DETECTED Final   Candida albicans NOT DETECTED NOT DETECTED Final   Candida auris NOT DETECTED NOT DETECTED Final   Candida glabrata NOT DETECTED NOT DETECTED  Final   Candida krusei NOT DETECTED NOT DETECTED Final   Candida parapsilosis NOT DETECTED NOT DETECTED Final   Candida tropicalis NOT DETECTED NOT DETECTED Final   Cryptococcus neoformans/gattii NOT DETECTED NOT DETECTED Final   CTX-M ESBL NOT DETECTED NOT DETECTED Final   Carbapenem resistance IMP NOT DETECTED NOT DETECTED Final   Carbapenem resistance KPC NOT DETECTED NOT DETECTED Final   Carbapenem resistance NDM NOT DETECTED NOT DETECTED Final   Carbapenem resist OXA 48 LIKE NOT DETECTED NOT DETECTED Final   Carbapenem resistance VIM NOT DETECTED NOT DETECTED Final    Comment: Performed at [facility], [address], [place], [place] [address]  Culture, blood (routine x 2)     Status: None (Preliminary result)   Collection Time: 08/19/20 10:26 AM   Specimen: BLOOD  Result Value Ref Range Status   Specimen Description   Final    BLOOD RIGHT ANTECUBITAL Performed at [facility] [address], [place], [place] [address]    Special Requests   Final    BOTTLES DRAWN AEROBIC AND ANAEROBIC Blood Culture adequate volume Performed at [facility] [address], [place], [place] [address]    Culture   Final    NO GROWTH < 24 HOURS Performed at [facility] [address] [place] [address], [place], [place] [address]    Report Status PENDING  Incomplete  Culture, blood (routine x 2)     Status: None (Preliminary result)   Collection Time: 08/19/20 12:06 PM   Specimen: BLOOD RIGHT HAND  Result Value Ref Range Status   Specimen Description   Final    BLOOD RIGHT HAND Performed at [facility]  [place] [address], [place], [place] [address]    Special Requests   Final    BOTTLES DRAWN AEROBIC AND ANAEROBIC Blood Culture adequate volume Performed at [facility] [address], [place], [place] [address]    Culture   Final    NO GROWTH < 24 HOURS Performed at [facility] [address],  [place], [place] [address]    Report Status PENDING  Incomplete          Radiology Studies: ECHO TEE  Result Date: 08/20/2020    TRANSESOPHOGEAL ECHO REPORT   Patient Name:   Logan Fisher Date of Exam: 08/20/2020 Medical Rec #:  [id number]      Height:       77.0 in Accession #:    [id number]     Weight:       185.2 lb Date of Birth:  [birth year]      BSA:          2.165 m Patient Age:    77 years       BP:           122/89 mmHg Patient Gender: M              HR:           77 bpm. Exam Location:  Inpatient Procedure: 3D Echo, Transesophageal Echo, Cardiac Doppler and Color Doppler Indications:     Bacteremia.  History:         Patient has prior history of Echocardiogram examinations, most                  recent 08/17/2020. Risk Factors:Dyslipidemia. IVDU. Septic                  embolism. Pulmonary embolus.  Sonographer:     Roseanna Rainbow RDCS Referring Phys:  [place] Diagnosing Phys: Lyman Bishop MD PROCEDURE: After discussion of the risks and benefits of a TEE, an informed consent was obtained from the patient. The transesophogeal probe was passed without difficulty through the esophogus of the patient. Imaged were obtained with the patient in a supine position. Sedation performed by different physician. The patient was monitored while under deep sedation. Anesthestetic sedation was provided intravenously by Anesthesiology: 477m of Propofol. The patient developed no complications during the procedure. IMPRESSIONS  1. Left ventricular ejection fraction, by estimation, is 50 to 55%. The left ventricle has low normal function.  2. Right ventricular systolic function is normal. The right ventricular size is normal.  3. No left atrial/left atrial appendage thrombus was detected.  4. Moderate elongation of the mitral anterior and posterior leaflets.  5. The mitral valve is abnormal. Trivial mitral valve regurgitation.  6. Leaflet elongation is noted - there is thickening of the leaflets, possibly  suggestive of recent endocarditis, but no obvious mobile vegetation. The tricuspid valve is abnormal.  7. The aortic valve is tricuspid. Aortic valve regurgitation is not visualized.  8. Leaflet elongation. The pulmonic valve was abnormal. Conclusion(s)/Recommendation(s): No evidence of vegetation/infective endocarditis on this transesophageal echocardiogram. FINDINGS  Left Ventricle: Left ventricular ejection fraction, by estimation, is 50 to 55%. The left ventricle has low normal function. The left ventricular internal cavity size was normal in size. There is no left ventricular hypertrophy. Right Ventricle: The right ventricular size is normal. No increase in right ventricular wall thickness. Right ventricular systolic function is normal. Left Atrium: Left atrial size was normal in size.  No left atrial/left atrial appendage thrombus was detected. Right Atrium: Right atrial size was normal in size. Pericardium: There is no evidence of pericardial effusion. Mitral Valve: The mitral valve is abnormal. The anterior and posterior leaflets are moderately elongated. Trivial mitral valve regurgitation. Tricuspid Valve: Leaflet elongation is noted - there is thickening of the leaflets, possibly suggestive of recent endocarditis, but no obvious mobile vegetation. The tricuspid valve is abnormal. Tricuspid valve regurgitation is trivial. There is moderate  prolapse of the tricuspid septal leaflet. Aortic Valve: The aortic valve is tricuspid. Aortic valve regurgitation is not visualized. Pulmonic Valve: Leaflet elongation. The pulmonic valve was abnormal. Pulmonic valve regurgitation is trivial. Aorta: The aortic root and ascending aorta are structurally normal, with no evidence of dilitation. IAS/Shunts: No atrial level shunt detected by color flow Doppler. Lyman Bishop MD Electronically signed by Lyman Bishop MD Signature Date/Time: 08/20/2020/3:14:08 PM    Final         Scheduled Meds:  diclofenac Sodium  4 g  Topical QID   enoxaparin (LOVENOX) injection  40 mg Subcutaneous Q24H   lidocaine  1 patch Transdermal Q24H   Continuous Infusions:  sodium chloride 100 mL/hr at 08/20/20 0527   ceFEPime (MAXIPIME) IV 2 g (08/20/20 0841)     LOS: 3 days    Time spent: 35 minutes     Shalanda Brogden A Fran Mcree, MD [facility]   If 7PM-7AM, please contact night-coverage [email or web address]  08/20/2020, 4:18 PM

## 2020-08-20 NOTE — Progress Notes (Signed)
ID PROGRESS NOTE  45yo M with serratia bacteremia with pulmonary septic emboli, and back pain  Underwent TEE which showed thickened TV. 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  Will sign off.  Duke Salvia MD MPH [facility] [phone]

## 2020-08-20 NOTE — Interval H&P Note (Signed)
History and Physical Interval Note:  08/20/2020 11:38 AM  Logan Fisher  has presented today for surgery, with the diagnosis of BACTEREMIA.  The various methods of treatment have been discussed with the patient and family. After consideration of risks, benefits and other options for treatment, the patient has consented to  Procedure(s): TRANSESOPHAGEAL ECHOCARDIOGRAM (TEE) (N/A) as a surgical intervention.  The patient's history has been reviewed, patient examined, no change in status, stable for surgery.  I have reviewed the patient's chart and labs.  Questions were answered to the patient's satisfaction.     Chrystie Nose

## 2020-08-20 NOTE — TOC Progression Note (Signed)
Transition of Care [facility]) - Progression Note    Patient Details  Name: AODHAN SCHEIDT MRN: [id number] Date of Birth: 1975-07-25  [facility] CM/SW Contact  Barbi Kumagai, Olegario Messier, RN Phone Number: 08/20/2020, 10:01 AM  Clinical Narrative:  Patient states home is safe,has pcp,has pharmacy,has own transport home.decline poly substance counseling-has his own resources that is  currently in process. CM will provide-health insurance resources.      Expected Discharge Plan: Home/Self Care Barriers to Discharge: Continued Medical Work up  Expected Discharge Plan and Services Expected Discharge Plan: Home/Self Care   Discharge Planning Services: CM Consult   Living arrangements for the past 2 months: Apartment                                       Social Determinants of Health (SDOH) Interventions    Readmission Risk Interventions No flowsheet data found.

## 2020-08-20 NOTE — Progress Notes (Signed)
  Echocardiogram Echocardiogram Transesophageal has been performed.  Logan Fisher 08/20/2020, 12:41 PM

## 2020-08-20 NOTE — TOC Progression Note (Signed)
Transition of Care [facility]) - Progression Note    Patient Details  Name: Logan Fisher MRN: [id number] Date of Birth: 22-Feb-1976  Transition of Care [facility]) CM/SW Contact  Caleesi Kohl, Olegario Messier, RN Phone Number: 08/20/2020, 12:44 PM  Clinical Narrative:  Noted per pharmacy-levofloxacin @ d/c-MATCH program can be used @ d/c. Patient has $3 for each med that qualifies.     Expected Discharge Plan: Home/Self Care Barriers to Discharge: Continued Medical Work up  Expected Discharge Plan and Services Expected Discharge Plan: Home/Self Care   Discharge Planning Services: CM Consult   Living arrangements for the past 2 months: Apartment                                       Social Determinants of Health (SDOH) Interventions    Readmission Risk Interventions No flowsheet data found.

## 2020-08-21 MED ORDER — IBUPROFEN 400 MG PO TABS: 400.0000 mg | ORAL_TABLET | Freq: Four times a day (QID) | ORAL | 0 refills | Status: DC | PRN

## 2020-08-21 MED ORDER — ACETAMINOPHEN 325 MG PO TABS: 650.0000 mg | ORAL_TABLET | Freq: Four times a day (QID) | ORAL | 0 refills | Status: DC | PRN

## 2020-08-21 MED ORDER — LIDOCAINE 5 % EX PTCH: 1.0000 | MEDICATED_PATCH | CUTANEOUS | 0 refills | Status: DC

## 2020-08-21 MED ORDER — LEVOFLOXACIN 750 MG PO TABS: 750.0000 mg | ORAL_TABLET | Freq: Every day | ORAL | 0 refills | Status: AC

## 2020-08-21 MED ORDER — METHOCARBAMOL 500 MG PO TABS: 750.0000 mg | ORAL_TABLET | Freq: Four times a day (QID) | ORAL | 0 refills | Status: DC | PRN

## 2020-08-21 MED ORDER — DICLOFENAC SODIUM 1 % EX GEL: 4.0000 g | Freq: Four times a day (QID) | CUTANEOUS | 0 refills | Status: DC

## 2020-08-21 NOTE — TOC Progression Note (Signed)
Transition of Care [facility]) - Progression Note    Patient Details  Name: Logan Fisher MRN: [id number] Date of Birth: Mar 07, 1975  Transition of Care [facility]) CM/SW Contact  Geni Bers, RN Phone Number: 08/21/2020, 11:30 AM  Clinical Narrative:     MATCH letter completed and given to pt.    Expected Discharge Plan: Home/Self Care Barriers to Discharge: Continued Medical Work up  Expected Discharge Plan and Services Expected Discharge Plan: Home/Self Care   Discharge Planning Services: CM Consult   Living arrangements for the past 2 months: Apartment                                       Social Determinants of Health (SDOH) Interventions    Readmission Risk Interventions No flowsheet data found.

## 2020-08-21 NOTE — Discharge Summary (Signed)
Physician Discharge Summary  Logan Fisher FYB:[id number] DOB: [birth year] DOA: 08/16/2020  PCP: Heywood Bene, PA-C  Admit date: 08/16/2020 Discharge date: 08/21/2020  Admitted From: Home  Disposition:  Home  Recommendations for Outpatient Follow-up:  Follow up with PCP in 1-2 weeks Please obtain BMP/CBC in one week Needs follow up with cardiology and ID    Discharge Condition:\ Stable.  CODE STATUS: Full Code Diet recommendation: Heart Healthy   Brief/Interim Summary: 45 year old with past medical history significant for hypertension, dyslipidemia hiatal hernia IV drug abuse who presents to the emergency room complaining of acute onset of excessive fatigue and tiredness associated with back pain as well as with dry cough and dyspnea without wheezing.  He reports fevers. Evaluation in the ER: Leukocytosis white count of 14, COVID-19 PCR negative, chest x-ray no acute cardiopulmonary disease.  CT abdomen and pelvis: Several scattered bilateral pulmonary nodules suspicious for septic emboli.  Mild thickening appearance of the rectosigmoid likely related to underdistention.    1-Septic Lung Emboli: Serratia Marcescens Bacteremia; -Blood cultures; Serratia Marcescens -2D echo  normal EF, diastolic dysfunction grade one. -IV antibiotics cefepime. Vancomycin discontinue. -ESR and CRP elevated. -TEE; No obvious endocarditis, but tricuspid Valve appears somewhat Thickened and there is prolapse of the septal leaflet, probably congenital. Can not exclude recent endocarditis.  -MRI lumbar spine negative for discitis. 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 He will need 6 weeks for pulmonary septic emboli.     2-HTN As needed labetalol   3-Anxiety: On as needed Xanax   4-Back pain: MRI negative for infection. On baclofen. Start Ibuprofen. Started  Lidoderm Patch.    Patient is stable for discharge.   Discharge Diagnoses:  Active Problems:    Septic embolism [facility])    Discharge Instructions  Discharge Instructions     Diet - low sodium heart healthy   Complete by: As directed    Increase activity slowly   Complete by: As directed       Allergies as of 08/21/2020   No Known Allergies      Medication List     TAKE these medications    acetaminophen 325 MG tablet Commonly known as: TYLENOL Take 2 tablets (650 mg total) by mouth every 6 (six) hours as needed for mild pain (or Fever >/= 101).   diclofenac Sodium 1 % Gel Commonly known as: VOLTAREN Apply 4 g topically 4 (four) times daily.   ibuprofen 400 MG tablet Commonly known as: ADVIL Take 1 tablet (400 mg total) by mouth every 6 (six) hours as needed for mild pain.   levofloxacin 750 MG tablet Commonly known as: Levaquin Take 1 tablet (750 mg total) by mouth daily.   lidocaine 5 % Commonly known as: LIDODERM Place 1 patch onto the skin daily. Remove & Discard patch within 12 hours or as directed by MD Start taking on: August 22, 2020   methocarbamol 500 MG tablet Commonly known as: ROBAXIN Take 1.5 tablets (750 mg total) by mouth every 6 (six) hours as needed for muscle spasms.        No Known Allergies  Consultations: ID Cardiology   Procedures/Studies: MR LUMBAR SPINE WO CONTRAST  Result Date: 08/18/2020 CLINICAL DATA:  Low back pain rule out infection. History of IV drug abuse. EXAM: MRI LUMBAR SPINE WITHOUT CONTRAST TECHNIQUE: Multiplanar, multisequence MR imaging of the lumbar spine was performed. No intravenous contrast was administered. COMPARISON:  None. FINDINGS: Segmentation:  Normal Alignment:  Normal Vertebrae:  Negative for fracture or mass. No bone marrow edema. No evidence of spinal infection. Conus medullaris and cauda equina: Conus extends to the L1-2 level. Conus and cauda equina appear normal. Paraspinal and other soft tissues: Negative for paraspinous mass, adenopathy, or fluid collection. Disc levels: L1-2: Negative L2-3:  Negative L3-4: Negative L4-5: Minimal left foraminal disc protrusion without neural impingement. Spinal canal normal in size L5-S1: Negative IMPRESSION: Negative for spinal infection lumbar spine Minimal left foraminal disc protrusion L4-5 without neural compression. Electronically Signed   By: Franchot Gallo M.D.   On: 08/18/2020 10:13   CT Abdomen Pelvis W Contrast  Result Date: 08/16/2020 CLINICAL DATA:  45 year old male with abdominal pain. History of IV drug use and osteomyelitis. EXAM: CT ABDOMEN AND PELVIS WITH CONTRAST TECHNIQUE: Multidetector CT imaging of the abdomen and pelvis was performed using the standard protocol following bolus administration of intravenous contrast. CONTRAST:  114m OMNIPAQUE IOHEXOL 300 MG/ML  SOLN COMPARISON:  None. FINDINGS: Lower chest: Several scattered bilateral pulmonary nodules suspicious for septic emboli in this patient with history of IV drug use. Clinical correlation is recommended. Chest CT may provide better evaluation if clinically indicated. No intra-abdominal free air or free fluid. Hepatobiliary: No focal liver abnormality is seen. No gallstones, gallbladder wall thickening, or biliary dilatation. Pancreas: Unremarkable. No pancreatic ductal dilatation or surrounding inflammatory changes. Spleen: Normal in size without focal abnormality. Adrenals/Urinary Tract: The adrenal glands unremarkable. The kidneys, visualized ureters, and urinary bladder appear unremarkable. Stomach/Bowel: Postsurgical changes of Nissen fundoplication. Mild thickened appearance of the rectosigmoid, likely related to underdistention. Active inflammation is less likely. There is no bowel obstruction. The appendix is not visualized with certainty. No inflammatory changes identified in the right lower quadrant. Vascular/Lymphatic: Mild atherosclerotic calcification of the abdominal aorta. The IVC is unremarkable. No portal venous gas. There is no adenopathy. There is mild diffuse  mesenteric stranding. Reproductive: The prostate and seminal vesicles are grossly unremarkable. Other: None Musculoskeletal: No acute or significant osseous findings. IMPRESSION: 1. Several scattered bilateral pulmonary nodules suspicious for septic emboli. 2. Mild thickened appearance of the rectosigmoid, likely related to underdistention. 3. No bowel obstruction. 4. Aortic Atherosclerosis (ICD10-I70.0). Electronically Signed   By: AAnner CreteM.D.   On: 08/16/2020 20:10   DG Chest Port 1 View  Result Date: 08/16/2020 CLINICAL DATA:  Questionable sepsis EXAM: PORTABLE CHEST 1 VIEW COMPARISON:  12/25/2018 FINDINGS: Heart and mediastinal contours are within normal limits. No focal opacities or effusions. No acute bony abnormality. IMPRESSION: No active disease. Electronically Signed   By: KRolm BaptiseM.D.   On: 08/16/2020 19:02   ECHOCARDIOGRAM COMPLETE  Result Date: 08/17/2020    ECHOCARDIOGRAM REPORT   Patient Name:   Logan ROSSINDate of Exam: 08/17/2020 Medical Rec #:  [id number]     Height:       77.0 in Accession #:    [id number]    Weight:       185.2 lb Date of Birth:  3Dec 19, 1977     BSA:          2.165 m Patient Age:    89years       BP:           140/95 mmHg Patient Gender: M              HR:           88 bpm. Exam Location:  Inpatient Procedure: 2D Echo, Cardiac Doppler and Color Doppler Indications:  Pulmonary embolus  History:        Patient has no prior history of Echocardiogram examinations.                 Risk Factors:Hypertension.  Sonographer:    [place] Referring Phys: [id number] JAN A [place]  1. Left ventricular ejection fraction, by estimation, is 50 to 55%. The left ventricle has low normal function. The left ventricle has no regional wall motion abnormalities. Left ventricular diastolic parameters are consistent with Grade I diastolic dysfunction (impaired relaxation).  2. Right ventricular systolic function is normal. The right ventricular size is normal.  Tricuspid regurgitation signal is inadequate for assessing PA pressure.  3. The mitral valve is normal in structure. Trivial mitral valve regurgitation. No evidence of mitral stenosis.  4. The aortic valve is tricuspid. Aortic valve regurgitation is not visualized. No aortic stenosis is present.  5. The inferior vena cava is normal in size with greater than 50% respiratory variability, suggesting right atrial pressure of 3 mmHg. FINDINGS  Left Ventricle: Left ventricular ejection fraction, by estimation, is 50 to 55%. The left ventricle has low normal function. The left ventricle has no regional wall motion abnormalities. The left ventricular internal cavity size was normal in size. There is no left ventricular hypertrophy. Left ventricular diastolic parameters are consistent with Grade I diastolic dysfunction (impaired relaxation). Right Ventricle: The right ventricular size is normal. Right ventricular systolic function is normal. Tricuspid regurgitation signal is inadequate for assessing PA pressure. The tricuspid regurgitant velocity is 1.95 m/s, and with an assumed right atrial  pressure of 3 mmHg, the estimated right ventricular systolic pressure is 00.3 mmHg. Left Atrium: Left atrial size was normal in size. Right Atrium: Right atrial size was normal in size. Pericardium: There is no evidence of pericardial effusion. Mitral Valve: The mitral valve is normal in structure. Trivial mitral valve regurgitation. No evidence of mitral valve stenosis. MV peak gradient, 3.4 mmHg. The mean mitral valve gradient is 2.0 mmHg. Tricuspid Valve: The tricuspid valve is normal in structure. Tricuspid valve regurgitation is trivial. No evidence of tricuspid stenosis. Aortic Valve: The aortic valve is tricuspid. Aortic valve regurgitation is not visualized. No aortic stenosis is present. Aortic valve mean gradient measures 4.0 mmHg. Aortic valve peak gradient measures 7.3 mmHg. Aortic valve area, by VTI measures 4.78 cm.  Pulmonic Valve: The pulmonic valve was normal in structure. Pulmonic valve regurgitation is not visualized. No evidence of pulmonic stenosis. Aorta: The aortic root is normal in size and structure. Venous: The inferior vena cava is normal in size with greater than 50% respiratory variability, suggesting right atrial pressure of 3 mmHg. IAS/Shunts: No atrial level shunt detected by color flow Doppler.  LEFT VENTRICLE PLAX 2D LVIDd:         5.20 cm      Diastology LVIDs:         4.00 cm      LV e' medial:    6.09 cm/s LV PW:         1.10 cm      LV E/e' medial:  13.8 LV IVS:        0.70 cm      LV e' lateral:   14.10 cm/s LVOT diam:     2.70 cm      LV E/e' lateral: 6.0 LV SV:         124 LV SV Index:   57 LVOT Area:     5.73 cm  LV  Volumes (MOD) LV vol d, MOD A2C: 110.0 ml LV vol d, MOD A4C: 131.0 ml LV vol s, MOD A2C: 69.5 ml LV vol s, MOD A4C: 72.8 ml LV SV MOD A2C:     40.5 ml LV SV MOD A4C:     131.0 ml LV SV MOD BP:      48.2 ml RIGHT VENTRICLE RV Basal diam:  3.50 cm RV Mid diam:    2.80 cm RV S prime:     19.10 cm/s TAPSE (M-mode): 2.2 cm LEFT ATRIUM             Index       RIGHT ATRIUM           Index LA diam:        3.60 cm 1.66 cm/m  RA Area:     12.60 cm LA Vol (A2C):   48.0 ml 22.17 ml/m RA Volume:   30.70 ml  14.18 ml/m LA Vol (A4C):   65.3 ml 30.16 ml/m LA Biplane Vol: 56.9 ml 26.28 ml/m  AORTIC VALVE                   PULMONIC VALVE AV Area (Vmax):    5.22 cm    PV Vmax:       0.95 m/s AV Area (Vmean):   4.77 cm    PV Vmean:      68.800 cm/s AV Area (VTI):     4.78 cm    PV VTI:        0.176 m AV Vmax:           135.00 cm/s PV Peak grad:  3.6 mmHg AV Vmean:          96.000 cm/s PV Mean grad:  2.0 mmHg AV VTI:            0.260 m AV Peak Grad:      7.3 mmHg AV Mean Grad:      4.0 mmHg LVOT Vmax:         123.00 cm/s LVOT Vmean:        79.900 cm/s LVOT VTI:          0.217 m LVOT/AV VTI ratio: 0.83  AORTA Ao Root diam: 3.30 cm Ao Asc diam:  3.20 cm MITRAL VALVE               TRICUSPID VALVE MV  Area (PHT): 4.71 cm    TR Peak grad:   15.2 mmHg MV Area VTI:   5.89 cm    TR Vmax:        195.00 cm/s MV Peak grad:  3.4 mmHg MV Mean grad:  2.0 mmHg    SHUNTS MV Vmax:       0.92 m/s    Systemic VTI:  0.22 m MV Vmean:      65.2 cm/s   Systemic Diam: 2.70 cm MV Decel Time: 161 msec MV E velocity: 84.20 cm/s MV A velocity: 86.40 cm/s MV E/A ratio:  0.97 Kirk Ruths MD Electronically signed by Kirk Ruths MD Signature Date/Time: 08/17/2020/2:32:39 PM    Final    ECHO TEE  Result Date: 08/20/2020    TRANSESOPHOGEAL ECHO REPORT   Patient Name:   Logan Fisher Date of Exam: 08/20/2020 Medical Rec #:  [id number]      Height:       77.0 in Accession #:    [id number]     Weight:       185.2 lb  Date of Birth:  [birth year]      BSA:          2.165 m Patient Age:    66 years       BP:           122/89 mmHg Patient Gender: M              HR:           77 bpm. Exam Location:  Inpatient Procedure: 3D Echo, Transesophageal Echo, Cardiac Doppler and Color Doppler Indications:     Bacteremia.  History:         Patient has prior history of Echocardiogram examinations, most                  recent 08/17/2020. Risk Factors:Dyslipidemia. IVDU. Septic                  embolism. Pulmonary embolus.  Sonographer:     Roseanna Rainbow RDCS Referring Phys:  [place] Diagnosing Phys: Lyman Bishop MD PROCEDURE: After discussion of the risks and benefits of a TEE, an informed consent was obtained from the patient. The transesophogeal probe was passed without difficulty through the esophogus of the patient. Imaged were obtained with the patient in a supine position. Sedation performed by different physician. The patient was monitored while under deep sedation. Anesthestetic sedation was provided intravenously by Anesthesiology: 457m of Propofol. The patient developed no complications during the procedure. IMPRESSIONS  1. Left ventricular ejection fraction, by estimation, is 50 to 55%. The left ventricle has low normal function.  2.  Right ventricular systolic function is normal. The right ventricular size is normal.  3. No left atrial/left atrial appendage thrombus was detected.  4. Moderate elongation of the mitral anterior and posterior leaflets.  5. The mitral valve is abnormal. Trivial mitral valve regurgitation.  6. Leaflet elongation is noted - there is thickening of the leaflets, possibly suggestive of recent endocarditis, but no obvious mobile vegetation. The tricuspid valve is abnormal.  7. The aortic valve is tricuspid. Aortic valve regurgitation is not visualized.  8. Leaflet elongation. The pulmonic valve was abnormal. Conclusion(s)/Recommendation(s): No evidence of vegetation/infective endocarditis on this transesophageal echocardiogram. FINDINGS  Left Ventricle: Left ventricular ejection fraction, by estimation, is 50 to 55%. The left ventricle has low normal function. The left ventricular internal cavity size was normal in size. There is no left ventricular hypertrophy. Right Ventricle: The right ventricular size is normal. No increase in right ventricular wall thickness. Right ventricular systolic function is normal. Left Atrium: Left atrial size was normal in size. No left atrial/left atrial appendage thrombus was detected. Right Atrium: Right atrial size was normal in size. Pericardium: There is no evidence of pericardial effusion. Mitral Valve: The mitral valve is abnormal. The anterior and posterior leaflets are moderately elongated. Trivial mitral valve regurgitation. Tricuspid Valve: Leaflet elongation is noted - there is thickening of the leaflets, possibly suggestive of recent endocarditis, but no obvious mobile vegetation. The tricuspid valve is abnormal. Tricuspid valve regurgitation is trivial. There is moderate  prolapse of the tricuspid septal leaflet. Aortic Valve: The aortic valve is tricuspid. Aortic valve regurgitation is not visualized. Pulmonic Valve: Leaflet elongation. The pulmonic valve was abnormal.  Pulmonic valve regurgitation is trivial. Aorta: The aortic root and ascending aorta are structurally normal, with no evidence of dilitation. IAS/Shunts: No atrial level shunt detected by color flow Doppler. KLyman BishopMD Electronically signed by KLyman BishopMD Signature Date/Time: 08/20/2020/3:14:08 PM  Final      Subjective: He report back pain controlled. ,   Discharge Exam: Vitals:   08/20/20 2120 08/21/20 0500  BP: 126/86 117/84  Pulse: 85 75  Resp: 20 12  Temp: 97.9 F (36.6 C) (!) 97.3 F (36.3 C)  SpO2: 100% 99%     General: Pt is alert, awake, not in acute distress Cardiovascular: RRR, S1/S2 +, no rubs, no gallops Respiratory: CTA bilaterally, no wheezing, no rhonchi Abdominal: Soft, NT, ND, bowel sounds + Extremities: no edema, no cyanosis    The results of significant diagnostics from this hospitalization (including imaging, microbiology, ancillary and laboratory) are listed below for reference.     Microbiology: Recent Results (from the past 240 hour(s))  Resp Panel by RT-PCR (Flu A&B, Covid) Nasopharyngeal Swab     Status: None   Collection Time: 08/16/20  6:12 PM   Specimen: Nasopharyngeal Swab; Nasopharyngeal(NP) swabs in vial transport medium  Result Value Ref Range Status   SARS Coronavirus 2 by RT PCR NEGATIVE NEGATIVE Final    Comment: (NOTE) SARS-CoV-2 target nucleic acids are NOT DETECTED.  The SARS-CoV-2 RNA is generally detectable in upper respiratory specimens during the acute phase of infection. The lowest concentration of SARS-CoV-2 viral copies this assay can detect is 138 copies/mL. A negative result does not preclude SARS-Cov-2 infection and should not be used as the sole basis for treatment or other patient management decisions. A negative result may occur with  improper specimen collection/handling, submission of specimen other than nasopharyngeal swab, presence of viral mutation(s) within the areas targeted by this assay, and  inadequate number of viral copies(<138 copies/mL). A negative result must be combined with clinical observations, patient history, and epidemiological information. The expected result is Negative.  Fact Sheet for Patients:  EntrepreneurPulse.com.au  Fact Sheet for Healthcare Providers:  IncredibleEmployment.be  This test is no t yet approved or cleared by the [place] FDA and  has been authorized for detection and/or diagnosis of SARS-CoV-2 by FDA under an Emergency Use Authorization (EUA). This EUA will remain  in effect (meaning this test can be used) for the duration of the COVID-19 declaration under Section 564(b)(1) of the Act, 21 U.S.C.section 360bbb-3(b)(1), unless the authorization is terminated  or revoked sooner.       Influenza A by PCR NEGATIVE NEGATIVE Final   Influenza B by PCR NEGATIVE NEGATIVE Final    Comment: (NOTE) The Xpert Xpress SARS-CoV-2/FLU/RSV plus assay is intended as an aid in the diagnosis of influenza from Nasopharyngeal swab specimens and should not be used as a sole basis for treatment. Nasal washings and aspirates are unacceptable for Xpert Xpress SARS-CoV-2/FLU/RSV testing.  Fact Sheet for Patients: EntrepreneurPulse.com.au  Fact Sheet for Healthcare Providers: IncredibleEmployment.be  This test is not yet approved or cleared by the [place] FDA and has been authorized for detection and/or diagnosis of SARS-CoV-2 by FDA under an Emergency Use Authorization (EUA). This EUA will remain in effect (meaning this test can be used) for the duration of the COVID-19 declaration under Section 564(b)(1) of the Act, 21 U.S.C. section 360bbb-3(b)(1), unless the authorization is terminated or revoked.  Performed at [facility], [address] [place], [place] [address]   Urine culture     Status: None   Collection Time: 08/16/20  6:12 PM   Specimen: In/Out  Cath Urine  Result Value Ref Range Status   Specimen Description   Final    IN/OUT CATH URINE Performed at [facility]  [address] [place], [place] [address]    Special Requests   Final    NONE Performed at [facility], [address], [place], [place] [address]    Culture   Final    NO GROWTH Performed at [facility], [place] [address], [place], [place] [address]    Report Status 08/18/2020 FINAL  Final  Blood Culture (routine x 2)     Status: Abnormal   Collection Time: 08/16/20  6:33 PM   Specimen: BLOOD  Result Value Ref Range Status   Specimen Description   Final    BLOOD RIGHT ANTECUBITAL Performed at [facility], [address] [place], [place] [address]    Special Requests   Final    BOTTLES DRAWN AEROBIC AND ANAEROBIC Blood Culture adequate volume Performed at [facility], [address] [place], [place] [address]    Culture  Setup Time   Final    GRAM NEGATIVE RODS IN BOTH AEROBIC AND ANAEROBIC BOTTLES Organism ID to follow CRITICAL RESULT CALLED TO, READ BACK BY AND VERIFIED WITH: PHARMD MCHELLE BELL AT 1007 ON 08/17/20 BY KJ Performed at [facility], [place] [address], [place], [place] [address]    Culture SERRATIA MARCESCENS (A)  Final   Report Status 08/19/2020 FINAL  Final   Organism ID, Bacteria SERRATIA MARCESCENS  Final      Susceptibility   Serratia marcescens - MIC*    CEFAZOLIN >=64 RESISTANT Resistant     CEFEPIME <=0.12 SENSITIVE Sensitive     CEFTAZIDIME <=1 SENSITIVE Sensitive     CEFTRIAXONE <=0.25 SENSITIVE Sensitive     CIPROFLOXACIN <=0.25 SENSITIVE Sensitive     GENTAMICIN <=1 SENSITIVE Sensitive     TRIMETH/SULFA <=20 SENSITIVE Sensitive     * SERRATIA MARCESCENS  Blood Culture ID Panel (Reflexed)     Status: Abnormal   Collection Time: 08/16/20  6:33 PM  Result Value Ref Range Status   Enterococcus faecalis NOT DETECTED NOT DETECTED Final   Enterococcus Faecium NOT DETECTED  NOT DETECTED Final   Listeria monocytogenes NOT DETECTED NOT DETECTED Final   Staphylococcus species NOT DETECTED NOT DETECTED Final   Staphylococcus aureus (BCID) NOT DETECTED NOT DETECTED Final   Staphylococcus epidermidis NOT DETECTED NOT DETECTED Final   Staphylococcus lugdunensis NOT DETECTED NOT DETECTED Final   Streptococcus species NOT DETECTED NOT DETECTED Final   Streptococcus agalactiae NOT DETECTED NOT DETECTED Final   Streptococcus pneumoniae NOT DETECTED NOT DETECTED Final   Streptococcus pyogenes NOT DETECTED NOT DETECTED Final   A.calcoaceticus-baumannii NOT DETECTED NOT DETECTED Final   Bacteroides fragilis NOT DETECTED NOT DETECTED Final   Enterobacterales DETECTED (A) NOT DETECTED Final    Comment: Enterobacterales represent a large order of gram negative bacteria, not a single organism. CRITICAL RESULT CALLED TO, READ BACK BY AND VERIFIED WITH: PHARMD MICHELLE BELL AT 1440 ON 08/17/20 BY KJ    Enterobacter cloacae complex NOT DETECTED NOT DETECTED Final   Escherichia coli NOT DETECTED NOT DETECTED Final   Klebsiella aerogenes NOT DETECTED NOT DETECTED Final   Klebsiella oxytoca NOT DETECTED NOT DETECTED Final   Klebsiella pneumoniae NOT DETECTED NOT DETECTED Final   Proteus species NOT DETECTED NOT DETECTED Final   Salmonella species NOT DETECTED NOT DETECTED Final   Serratia marcescens DETECTED (A) NOT DETECTED Final    Comment: CRITICAL RESULT CALLED TO, READ BACK BY AND VERIFIED WITH: PHARMD MICHELLE BELL  AT 5883  ON 08/17/20 BY KJ    Haemophilus influenzae NOT DETECTED NOT DETECTED Final   Neisseria meningitidis NOT DETECTED NOT DETECTED Final   Pseudomonas aeruginosa NOT DETECTED NOT DETECTED Final   Stenotrophomonas maltophilia NOT DETECTED NOT DETECTED Final   Candida albicans NOT DETECTED NOT DETECTED Final   Candida auris NOT DETECTED NOT DETECTED Final   Candida glabrata NOT DETECTED NOT DETECTED Final   Candida krusei NOT DETECTED NOT DETECTED Final    Candida parapsilosis NOT DETECTED NOT DETECTED Final   Candida tropicalis NOT DETECTED NOT DETECTED Final   Cryptococcus neoformans/gattii NOT DETECTED NOT DETECTED Final   CTX-M ESBL NOT DETECTED NOT DETECTED Final   Carbapenem resistance IMP NOT DETECTED NOT DETECTED Final   Carbapenem resistance KPC NOT DETECTED NOT DETECTED Final   Carbapenem resistance NDM NOT DETECTED NOT DETECTED Final   Carbapenem resist OXA 48 LIKE NOT DETECTED NOT DETECTED Final   Carbapenem resistance VIM NOT DETECTED NOT DETECTED Final    Comment: Performed at [facility], [address] [place], [place] [address]  Blood Culture (routine x 2)     Status: Abnormal   Collection Time: 08/17/20  1:23 AM   Specimen: BLOOD  Result Value Ref Range Status   Specimen Description   Final    BLOOD RIGHT ARM Performed at [facility] [address], [place], [place] [address]    Special Requests   Final    BOTTLES DRAWN AEROBIC ONLY Blood Culture results may not be optimal due to an excessive volume of blood received in culture bottles Performed at [facility] [address], [place], [place] [address]    Culture  Setup Time   Final    Organism ID to follow AEROBIC BOTTLE ONLY GRAM NEGATIVE RODS CRITICAL RESULT CALLED TO, READ BACK BY AND VERIFIED WITH: Cristopher Estimable RN 2112 08/17/20 A BROWNING    Culture (A)  Final    SERRATIA MARCESCENS SUSCEPTIBILITIES PERFORMED ON PREVIOUS CULTURE WITHIN THE LAST 5 DAYS. Performed at [facility], [place] [address] [place], [place] [address]    Report Status 08/19/2020 FINAL  Final  Blood Culture ID Panel (Reflexed)     Status: Abnormal   Collection Time: 08/17/20  1:23 AM  Result Value Ref Range Status   Enterococcus faecalis NOT DETECTED NOT DETECTED Final   Enterococcus Faecium NOT DETECTED NOT DETECTED Final   Listeria monocytogenes NOT DETECTED NOT DETECTED Final   Staphylococcus species NOT DETECTED NOT DETECTED  Final   Staphylococcus aureus (BCID) NOT DETECTED NOT DETECTED Final   Staphylococcus epidermidis NOT DETECTED NOT DETECTED Final   Staphylococcus lugdunensis NOT DETECTED NOT DETECTED Final   Streptococcus species NOT DETECTED NOT DETECTED Final   Streptococcus agalactiae NOT DETECTED NOT DETECTED Final   Streptococcus pneumoniae NOT DETECTED NOT DETECTED Final   Streptococcus pyogenes NOT DETECTED NOT DETECTED Final   A.calcoaceticus-baumannii NOT DETECTED NOT DETECTED Final   Bacteroides fragilis NOT DETECTED NOT DETECTED Final   Enterobacterales DETECTED (A) NOT DETECTED Final    Comment: Enterobacterales represent a large order of gram negative bacteria, not a single organism. CRITICAL RESULT CALLED TO, READ BACK BY AND VERIFIED WITH: E WILLIAMSON PHARMD 2112 08/17/20 A BROWNING    Enterobacter cloacae complex NOT DETECTED NOT DETECTED Final   Escherichia coli NOT DETECTED NOT DETECTED Final   Klebsiella aerogenes NOT DETECTED NOT DETECTED Final   Klebsiella oxytoca NOT DETECTED NOT DETECTED Final   Klebsiella pneumoniae NOT DETECTED NOT  DETECTED Final   Proteus species NOT DETECTED NOT DETECTED Final   Salmonella species NOT DETECTED NOT DETECTED Final   Serratia marcescens DETECTED (A) NOT DETECTED Final    Comment: CRITICAL RESULT CALLED TO, READ BACK BY AND VERIFIED WITH: Cristopher Estimable PHARMD 2112 08/17/20 A BROWNING    Haemophilus influenzae NOT DETECTED NOT DETECTED Final   Neisseria meningitidis NOT DETECTED NOT DETECTED Final   Pseudomonas aeruginosa NOT DETECTED NOT DETECTED Final   Stenotrophomonas maltophilia NOT DETECTED NOT DETECTED Final   Candida albicans NOT DETECTED NOT DETECTED Final   Candida auris NOT DETECTED NOT DETECTED Final   Candida glabrata NOT DETECTED NOT DETECTED Final   Candida krusei NOT DETECTED NOT DETECTED Final   Candida parapsilosis NOT DETECTED NOT DETECTED Final   Candida tropicalis NOT DETECTED NOT DETECTED Final   Cryptococcus  neoformans/gattii NOT DETECTED NOT DETECTED Final   CTX-M ESBL NOT DETECTED NOT DETECTED Final   Carbapenem resistance IMP NOT DETECTED NOT DETECTED Final   Carbapenem resistance KPC NOT DETECTED NOT DETECTED Final   Carbapenem resistance NDM NOT DETECTED NOT DETECTED Final   Carbapenem resist OXA 48 LIKE NOT DETECTED NOT DETECTED Final   Carbapenem resistance VIM NOT DETECTED NOT DETECTED Final    Comment: Performed at [facility], [address], [place], [place] [address]  Culture, blood (routine x 2)     Status: None (Preliminary result)   Collection Time: 08/19/20 10:26 AM   Specimen: BLOOD  Result Value Ref Range Status   Specimen Description   Final    BLOOD RIGHT ANTECUBITAL Performed at [facility] [address] [place], [place] [address]    Special Requests   Final    BOTTLES DRAWN AEROBIC AND ANAEROBIC Blood Culture adequate volume Performed at [facility] [address], [place], [place] [address]    Culture   Final    NO GROWTH < 24 HOURS Performed at [facility] [address] [place], [place] [address]    Report Status PENDING  Incomplete  Culture, blood (routine x 2)     Status: None (Preliminary result)   Collection Time: 08/19/20 12:06 PM   Specimen: BLOOD RIGHT HAND  Result Value Ref Range Status   Specimen Description   Final    BLOOD RIGHT HAND Performed at [facility] [address], [place], [place] [address]    Special Requests   Final    BOTTLES DRAWN AEROBIC AND ANAEROBIC Blood Culture adequate volume Performed at [facility] [address], [place],  [address]    Culture   Final    NO GROWTH < 24 HOURS Performed at [facility] [address], [place],  [address]    Report Status PENDING  Incomplete     Labs: BNP (last 3 results) No results for input(s): BNP in the last 8760 hours. Basic Metabolic Panel: Recent Labs  Lab  08/16/20 1812 08/17/20 0123 08/18/20 0950 08/19/20 0745  NA 136 136 137 138  K 4.2 3.6 3.6 3.9  CL 103 102 104 105  CO2 25 24 26 26   GLUCOSE 112* 116* 145* 141*  BUN 7 6 7 7   CREATININE 0.69 0.78 0.75 0.71  CALCIUM 8.6* 8.9 8.5* 8.6*   Liver Function Tests: Recent Labs  Lab 08/16/20 1812  AST 24  ALT 16  ALKPHOS 88  BILITOT 0.7  PROT 7.3  ALBUMIN 3.1*   No results for  input(s): LIPASE, AMYLASE in the last 168 hours. No results for input(s): AMMONIA in the last 168 hours. CBC: Recent Labs  Lab 08/16/20 1812 08/17/20 0123 08/18/20 0950 08/19/20 0745  WBC 14.9* 16.0* 12.3* 11.4*  NEUTROABS 11.9*  --   --   --   HGB 12.6* 12.4* 12.4* 12.9*  HCT 37.6* 37.6* 38.0* 40.3  MCV 76.3* 78.3* 78.8* 79.3*  PLT 451* 430* 456* 513*   Cardiac Enzymes: No results for input(s): CKTOTAL, CKMB, CKMBINDEX, TROPONINI in the last 168 hours. BNP: Invalid input(s): POCBNP CBG: No results for input(s): GLUCAP in the last 168 hours. D-Dimer No results for input(s): DDIMER in the last 72 hours. Hgb A1c No results for input(s): HGBA1C in the last 72 hours. Lipid Profile No results for input(s): CHOL, HDL, LDLCALC, TRIG, CHOLHDL, LDLDIRECT in the last 72 hours. Thyroid function studies No results for input(s): TSH, T4TOTAL, T3FREE, THYROIDAB in the last 72 hours.  Invalid input(s): FREET3 Anemia work up No results for input(s): VITAMINB12, FOLATE, FERRITIN, TIBC, IRON, RETICCTPCT in the last 72 hours. Urinalysis    Component Value Date/Time   COLORURINE YELLOW 08/16/2020 1812   APPEARANCEUR CLEAR 08/16/2020 1812   LABSPEC 1.010 08/16/2020 1812   PHURINE 8.0 08/16/2020 1812   GLUCOSEU NEGATIVE 08/16/2020 1812   HGBUR NEGATIVE 08/16/2020 1812   BILIRUBINUR NEGATIVE 08/16/2020 1812   KETONESUR NEGATIVE 08/16/2020 1812   PROTEINUR NEGATIVE 08/16/2020 1812   NITRITE NEGATIVE 08/16/2020 1812   LEUKOCYTESUR NEGATIVE 08/16/2020 1812   Sepsis Labs Invalid input(s): PROCALCITONIN,   WBC,  LACTICIDVEN Microbiology Recent Results (from the past 240 hour(s))  Resp Panel by RT-PCR (Flu A&B, Covid) Nasopharyngeal Swab     Status: None   Collection Time: 08/16/20  6:12 PM   Specimen: Nasopharyngeal Swab; Nasopharyngeal(NP) swabs in vial transport medium  Result Value Ref Range Status   SARS Coronavirus 2 by RT PCR NEGATIVE NEGATIVE Final    Comment: (NOTE) SARS-CoV-2 target nucleic acids are NOT DETECTED.  The SARS-CoV-2 RNA is generally detectable in upper respiratory specimens during the acute phase of infection. The lowest concentration of SARS-CoV-2 viral copies this assay can detect is 138 copies/mL. A negative result does not preclude SARS-Cov-2 infection and should not be used as the sole basis for treatment or other patient management decisions. A negative result may occur with  improper specimen collection/handling, submission of specimen other than nasopharyngeal swab, presence of viral mutation(s) within the areas targeted by this assay, and inadequate number of viral copies(<138 copies/mL). A negative result must be combined with clinical observations, patient history, and epidemiological information. The expected result is Negative.  Fact Sheet for Patients:  EntrepreneurPulse.com.au  Fact Sheet for Healthcare Providers:  IncredibleEmployment.be  This test is no t yet approved or cleared by the [place] FDA and  has been authorized for detection and/or diagnosis of SARS-CoV-2 by FDA under an Emergency Use Authorization (EUA). This EUA will remain  in effect (meaning this test can be used) for the duration of the COVID-19 declaration under Section 564(b)(1) of the Act, 21 U.S.C.section 360bbb-3(b)(1), unless the authorization is terminated  or revoked sooner.       Influenza A by PCR NEGATIVE NEGATIVE Final   Influenza B by PCR NEGATIVE NEGATIVE Final    Comment: (NOTE) The Xpert Xpress SARS-CoV-2/FLU/RSV  plus assay is intended as an aid in the diagnosis of influenza from Nasopharyngeal swab specimens and should not be used as a sole basis for treatment. Nasal washings and aspirates are  unacceptable for Xpert Xpress SARS-CoV-2/FLU/RSV testing.  Fact Sheet for Patients: EntrepreneurPulse.com.au  Fact Sheet for Healthcare Providers: IncredibleEmployment.be  This test is not yet approved or cleared by the [place] FDA and has been authorized for detection and/or diagnosis of SARS-CoV-2 by FDA under an Emergency Use Authorization (EUA). This EUA will remain in effect (meaning this test can be used) for the duration of the COVID-19 declaration under Section 564(b)(1) of the Act, 21 U.S.C. section 360bbb-3(b)(1), unless the authorization is terminated or revoked.  Performed at [facility], [address] [place], [place] [address]   Urine culture     Status: None   Collection Time: 08/16/20  6:12 PM   Specimen: In/Out Cath Urine  Result Value Ref Range Status   Specimen Description   Final    IN/OUT CATH URINE Performed at [facility], [place] [place], [place] [address]    Special Requests   Final    NONE Performed at [facility], [address] [place], [place] [address]    Culture   Final    NO GROWTH Performed at [facility], [place] [address] [place], [place] [address]    Report Status 08/18/2020 FINAL  Final  Blood Culture (routine x 2)     Status: Abnormal   Collection Time: 08/16/20  6:33 PM   Specimen: BLOOD  Result Value Ref Range Status   Specimen Description   Final    BLOOD RIGHT ANTECUBITAL Performed at [facility], [address] [place], [place] [address]    Special Requests   Final    BOTTLES DRAWN AEROBIC AND ANAEROBIC Blood Culture adequate volume Performed at [facility], [address] [place], [place] [address]    Culture  Setup Time    Final    GRAM NEGATIVE RODS IN BOTH AEROBIC AND ANAEROBIC BOTTLES Organism ID to follow CRITICAL RESULT CALLED TO, READ BACK BY AND VERIFIED WITH: PHARMD MCHELLE BELL AT 5284 ON 08/17/20 BY KJ Performed at [facility], [place] [address], [place], [place] [address]    Culture SERRATIA MARCESCENS (A)  Final   Report Status 08/19/2020 FINAL  Final   Organism ID, Bacteria SERRATIA MARCESCENS  Final      Susceptibility   Serratia marcescens - MIC*    CEFAZOLIN >=64 RESISTANT Resistant     CEFEPIME <=0.12 SENSITIVE Sensitive     CEFTAZIDIME <=1 SENSITIVE Sensitive     CEFTRIAXONE <=0.25 SENSITIVE Sensitive     CIPROFLOXACIN <=0.25 SENSITIVE Sensitive     GENTAMICIN <=1 SENSITIVE Sensitive     TRIMETH/SULFA <=20 SENSITIVE Sensitive     * SERRATIA MARCESCENS  Blood Culture ID Panel (Reflexed)     Status: Abnormal   Collection Time: 08/16/20  6:33 PM  Result Value Ref Range Status   Enterococcus faecalis NOT DETECTED NOT DETECTED Final   Enterococcus Faecium NOT DETECTED NOT DETECTED Final   Listeria monocytogenes NOT DETECTED NOT DETECTED Final   Staphylococcus species NOT DETECTED NOT DETECTED Final   Staphylococcus aureus (BCID) NOT DETECTED NOT DETECTED Final   Staphylococcus epidermidis NOT DETECTED NOT DETECTED Final   Staphylococcus lugdunensis NOT DETECTED NOT DETECTED Final   Streptococcus species NOT DETECTED NOT DETECTED Final   Streptococcus agalactiae NOT DETECTED NOT DETECTED Final   Streptococcus pneumoniae NOT DETECTED NOT DETECTED Final   Streptococcus pyogenes NOT DETECTED NOT DETECTED Final   A.calcoaceticus-baumannii NOT DETECTED  NOT DETECTED Final   Bacteroides fragilis NOT DETECTED NOT DETECTED Final   Enterobacterales DETECTED (A) NOT DETECTED Final    Comment: Enterobacterales represent a large order of gram negative bacteria, not a single organism. CRITICAL RESULT CALLED TO, READ BACK BY AND VERIFIED WITH: PHARMD MICHELLE BELL AT 1440 ON 08/17/20 BY KJ     Enterobacter cloacae complex NOT DETECTED NOT DETECTED Final   Escherichia coli NOT DETECTED NOT DETECTED Final   Klebsiella aerogenes NOT DETECTED NOT DETECTED Final   Klebsiella oxytoca NOT DETECTED NOT DETECTED Final   Klebsiella pneumoniae NOT DETECTED NOT DETECTED Final   Proteus species NOT DETECTED NOT DETECTED Final   Salmonella species NOT DETECTED NOT DETECTED Final   Serratia marcescens DETECTED (A) NOT DETECTED Final    Comment: CRITICAL RESULT CALLED TO, READ BACK BY AND VERIFIED WITH: PHARMD MICHELLE BELL  AT 1439 ON 08/17/20 BY KJ    Haemophilus influenzae NOT DETECTED NOT DETECTED Final   Neisseria meningitidis NOT DETECTED NOT DETECTED Final   Pseudomonas aeruginosa NOT DETECTED NOT DETECTED Final   Stenotrophomonas maltophilia NOT DETECTED NOT DETECTED Final   Candida albicans NOT DETECTED NOT DETECTED Final   Candida auris NOT DETECTED NOT DETECTED Final   Candida glabrata NOT DETECTED NOT DETECTED Final   Candida krusei NOT DETECTED NOT DETECTED Final   Candida parapsilosis NOT DETECTED NOT DETECTED Final   Candida tropicalis NOT DETECTED NOT DETECTED Final   Cryptococcus neoformans/gattii NOT DETECTED NOT DETECTED Final   CTX-M ESBL NOT DETECTED NOT DETECTED Final   Carbapenem resistance IMP NOT DETECTED NOT DETECTED Final   Carbapenem resistance KPC NOT DETECTED NOT DETECTED Final   Carbapenem resistance NDM NOT DETECTED NOT DETECTED Final   Carbapenem resist OXA 48 LIKE NOT DETECTED NOT DETECTED Final   Carbapenem resistance VIM NOT DETECTED NOT DETECTED Final    Comment: Performed at [facility], [address], [place], [place] [address]  Blood Culture (routine x 2)     Status: Abnormal   Collection Time: 08/17/20  1:23 AM   Specimen: BLOOD  Result Value Ref Range Status   Specimen Description   Final    BLOOD RIGHT ARM Performed at [facility] [address] [place], [place] [address]    Special Requests   Final    BOTTLES  DRAWN AEROBIC ONLY Blood Culture results may not be optimal due to an excessive volume of blood received in culture bottles Performed at [facility] [address], [place], [place] [address]    Culture  Setup Time   Final    Organism ID to follow AEROBIC BOTTLE ONLY GRAM NEGATIVE RODS CRITICAL RESULT CALLED TO, READ BACK BY AND VERIFIED WITH: Cristopher Estimable RN 2112 08/17/20 A BROWNING    Culture (A)  Final    SERRATIA MARCESCENS SUSCEPTIBILITIES PERFORMED ON PREVIOUS CULTURE WITHIN THE LAST 5 DAYS. Performed at [facility], [place] [address] [place], [place] [address]    Report Status 08/19/2020 FINAL  Final  Blood Culture ID Panel (Reflexed)     Status: Abnormal   Collection Time: 08/17/20  1:23 AM  Result Value Ref Range Status   Enterococcus faecalis NOT DETECTED NOT DETECTED Final   Enterococcus Faecium NOT DETECTED NOT DETECTED Final   Listeria monocytogenes NOT DETECTED NOT DETECTED Final   Staphylococcus species NOT DETECTED NOT DETECTED Final   Staphylococcus aureus (BCID) NOT DETECTED NOT DETECTED Final   Staphylococcus epidermidis NOT DETECTED NOT DETECTED  Final   Staphylococcus lugdunensis NOT DETECTED NOT DETECTED Final   Streptococcus species NOT DETECTED NOT DETECTED Final   Streptococcus agalactiae NOT DETECTED NOT DETECTED Final   Streptococcus pneumoniae NOT DETECTED NOT DETECTED Final   Streptococcus pyogenes NOT DETECTED NOT DETECTED Final   A.calcoaceticus-baumannii NOT DETECTED NOT DETECTED Final   Bacteroides fragilis NOT DETECTED NOT DETECTED Final   Enterobacterales DETECTED (A) NOT DETECTED Final    Comment: Enterobacterales represent a large order of gram negative bacteria, not a single organism. CRITICAL RESULT CALLED TO, READ BACK BY AND VERIFIED WITH: E WILLIAMSON PHARMD 2112 08/17/20 A BROWNING    Enterobacter cloacae complex NOT DETECTED NOT DETECTED Final   Escherichia coli NOT DETECTED NOT DETECTED Final   Klebsiella  aerogenes NOT DETECTED NOT DETECTED Final   Klebsiella oxytoca NOT DETECTED NOT DETECTED Final   Klebsiella pneumoniae NOT DETECTED NOT DETECTED Final   Proteus species NOT DETECTED NOT DETECTED Final   Salmonella species NOT DETECTED NOT DETECTED Final   Serratia marcescens DETECTED (A) NOT DETECTED Final    Comment: CRITICAL RESULT CALLED TO, READ BACK BY AND VERIFIED WITH: Cristopher Estimable PHARMD 2112 08/17/20 A BROWNING    Haemophilus influenzae NOT DETECTED NOT DETECTED Final   Neisseria meningitidis NOT DETECTED NOT DETECTED Final   Pseudomonas aeruginosa NOT DETECTED NOT DETECTED Final   Stenotrophomonas maltophilia NOT DETECTED NOT DETECTED Final   Candida albicans NOT DETECTED NOT DETECTED Final   Candida auris NOT DETECTED NOT DETECTED Final   Candida glabrata NOT DETECTED NOT DETECTED Final   Candida krusei NOT DETECTED NOT DETECTED Final   Candida parapsilosis NOT DETECTED NOT DETECTED Final   Candida tropicalis NOT DETECTED NOT DETECTED Final   Cryptococcus neoformans/gattii NOT DETECTED NOT DETECTED Final   CTX-M ESBL NOT DETECTED NOT DETECTED Final   Carbapenem resistance IMP NOT DETECTED NOT DETECTED Final   Carbapenem resistance KPC NOT DETECTED NOT DETECTED Final   Carbapenem resistance NDM NOT DETECTED NOT DETECTED Final   Carbapenem resist OXA 48 LIKE NOT DETECTED NOT DETECTED Final   Carbapenem resistance VIM NOT DETECTED NOT DETECTED Final    Comment: Performed at [facility], [address] [place], [place] [address]  Culture, blood (routine x 2)     Status: None (Preliminary result)   Collection Time: 08/19/20 10:26 AM   Specimen: BLOOD  Result Value Ref Range Status   Specimen Description   Final    BLOOD RIGHT ANTECUBITAL Performed at [facility] [address] [place], [place] [address]    Special Requests   Final    BOTTLES DRAWN AEROBIC AND ANAEROBIC Blood Culture adequate volume Performed at [facility] [address], [place], [place] [address]    Culture   Final    NO GROWTH < 24 HOURS Performed at [facility] [address], [place], [place] [address]    Report Status PENDING  Incomplete  Culture, blood (routine x 2)     Status: None (Preliminary result)   Collection Time: 08/19/20 12:06 PM   Specimen: BLOOD RIGHT HAND  Result Value Ref Range Status   Specimen Description   Final    BLOOD RIGHT HAND Performed at [facility] [address], [place], [place] [address]    Special Requests   Final    BOTTLES DRAWN AEROBIC AND ANAEROBIC Blood Culture adequate volume Performed at [facility] [address], [place], [place] [address]  Culture   Final    NO GROWTH < 24 HOURS Performed at [facility], [place] [address], [place], [place] [address]    Report Status PENDING  Incomplete     Time coordinating discharge: 40 minutes  SIGNED:   Elmarie Shiley, MD  [facility]

## 2020-08-21 NOTE — Progress Notes (Signed)
PT Cancellation Note  Patient Details Name: Logan Fisher MRN: [id number] DOB: September 10, 1975   Cancelled Treatment:       Chart reviewed and spoke with pt at bedside who had just returned from walking to the bathroom. Pt stated he does not have any difficulty with ambulation or mobility and felt no need for PT at this time. Will sign off /DC order.    Marella Bile 08/21/2020, 11:40 AM Clois Dupes, PT, MPT Acute Rehabilitation Services Office: [phone] Pager: [phone] 08/21/2020

## 2020-08-22 ENCOUNTER — Encounter (HOSPITAL_COMMUNITY): Payer: Self-pay | Admitting: Internal Medicine

## 2020-08-24 LAB — CULTURE, BLOOD (ROUTINE X 2)
Culture: NO GROWTH
Culture: NO GROWTH
Special Requests: ADEQUATE
Special Requests: ADEQUATE

## 2020-12-14 ENCOUNTER — Telehealth: Payer: Self-pay

## 2020-12-14 NOTE — Telephone Encounter (Signed)
Called [facility] to give staff appt for client to establish primary medical care with [facility]. Appt 12/28/20 at 1PM

## 2020-12-15 ENCOUNTER — Ambulatory Visit
Admission: EM | Admit: 2020-12-15 | Discharge: 2020-12-15 | Disposition: A | Discharge status: Home / Self Care | Payer: Self-pay | Attending: Family Medicine | Admitting: Family Medicine

## 2020-12-15 ENCOUNTER — Other Ambulatory Visit: Payer: Self-pay

## 2020-12-15 ENCOUNTER — Encounter: Payer: Self-pay | Admitting: Emergency Medicine

## 2020-12-15 DIAGNOSIS — K047 Periapical abscess without sinus: Secondary | ICD-10-CM

## 2020-12-15 MED ORDER — AMOXICILLIN-POT CLAVULANATE 875-125 MG PO TABS: 1.0000 | ORAL_TABLET | Freq: Two times a day (BID) | ORAL | 0 refills | Status: DC

## 2020-12-15 MED ORDER — LIDOCAINE VISCOUS HCL 2 % MT SOLN: 5.0000 mL | OROMUCOSAL | 0 refills | Status: DC | PRN

## 2020-12-15 NOTE — ED Triage Notes (Signed)
Dental pain currently trying to get in with a dentist.  Pain on right side of face, that radiated up to head.

## 2020-12-15 NOTE — ED Provider Notes (Signed)
[facility]    CSN: [id number] Arrival date & time: 12/15/20  1253      History   Chief Complaint No chief complaint on file.   HPI Logan Fisher is a 45 y.o. male.   Patient presenting today with several day history of worsening right-sided dental pain now radiating up toward ear and temple.  Denies fever, chills, dysphagia, headache, drainage in the mouth.  So far taking clove oil and over-the-counter pain relievers with minimal relief.  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   Past Medical History:  Diagnosis Date   AC (acromioclavicular) joint bone spurs    Cellulitis    Drug abuse (HCC)    Environmental allergies    Hiatal hernia    Hypercholesteremia    Hypertension     Patient Active Problem List   Diagnosis Date Noted   Septic embolism (HCC) 08/16/2020   AC (acromioclavicular) joint bone spurs 08/16/2020   Cellulitis 08/16/2020   Drug abuse (HCC) 08/16/2020   Hypercholesteremia 08/16/2020   Hiatal hernia 08/16/2020   Septic arthritis (HCC) 12/25/2018   IV drug abuse (HCC) 12/25/2018   Hypokalemia 12/25/2018    Past Surgical History:  Procedure Laterality Date   BUBBLE STUDY  08/20/2020   Procedure: BUBBLE STUDY;  Surgeon: Chrystie Nose, MD;  Location: [facility] ENDOSCOPY;  Service: Cardiovascular;;   HERNIA REPAIR     IRRIGATION AND DEBRIDEMENT STERNOCLAVICULAR JOINT-STERNUM AND RIBS Left 01/24/2019   TEE WITHOUT CARDIOVERSION N/A 08/20/2020   Procedure: TRANSESOPHAGEAL ECHOCARDIOGRAM (TEE);  Surgeon: Chrystie Nose, MD;  Location: [facility] ENDOSCOPY;  Service: Cardiovascular;  Laterality: N/A;       Home Medications    Prior to Admission medications   Medication Sig Start Date End Date Taking? Authorizing Provider  amoxicillin-clavulanate (AUGMENTIN) 875-125 MG tablet Take 1 tablet by mouth every 12 (twelve) hours. 12/15/20  Yes Particia Nearing, PA-C  lidocaine (XYLOCAINE) 2 % solution Use as directed 5 mLs in the mouth or throat as needed for mouth pain. 12/15/20  Yes Particia Nearing, PA-C  acetaminophen (TYLENOL) 325 MG tablet Take 2 tablets (650 mg total) by mouth every 6 (six) hours as needed for mild pain (or Fever >/= 101). 08/21/20   Regalado, Belkys A, MD  diclofenac Sodium (VOLTAREN) 1 % GEL Apply 4 g topically 4 (four) times daily. 08/21/20   Regalado, Belkys A, MD  ibuprofen (ADVIL) 400 MG tablet Take 1 tablet (400 mg total) by mouth every 6 (six) hours as needed for mild pain. 08/21/20   Regalado, Belkys A, MD  lidocaine (LIDODERM) 5 % Place 1 patch onto the skin daily. Remove & Discard patch within 12 hours or as directed by MD 08/22/20   Regalado, Jon Billings A, MD  methocarbamol (ROBAXIN) 500 MG tablet Take 1.5 tablets (750 mg total) by mouth every 6 (six) hours as needed for muscle spasms. 08/21/20   Regalado, Prentiss Bells, MD    Family History History reviewed. No pertinent family history.  Social History Social History   Tobacco Use   Smoking status: Never   Smokeless tobacco: Never  Vaping Use   Vaping Use: Never used  Substance Use Topics   Alcohol use: Yes    Comment: weekly   Drug use: Yes    Types: Methamphetamines, IV, Cocaine    Comment: heroin and meth  Allergies   Patient has no known allergies.   Review of Systems Review of Systems Per HPI  Physical Exam Triage Vital Signs ED Triage Vitals  Enc Vitals Group     BP 12/15/20 1401 (!) 156/76     Pulse Rate 12/15/20 1401 79     Resp 12/15/20 1401 18     Temp 12/15/20 1401 98 F (36.7 C)     Temp Source 12/15/20 1401 Temporal     SpO2 12/15/20 1401 98 %     Weight --      Height --      Head Circumference --      Peak Flow --      Pain Score 12/15/20 1402 8     Pain Loc --      Pain Edu? --      Excl. in GC? --    No data found.  Updated Vital Signs BP (!) 156/76 (BP Location: Right Arm)   Pulse 79   Temp 98 F  (36.7 C) (Temporal)   Resp 18   SpO2 98%   Visual Acuity Right Eye Distance:   Left Eye Distance:   Bilateral Distance:    Right Eye Near:   Left Eye Near:    Bilateral Near:     Physical Exam Vitals and nursing note reviewed.  Constitutional:      Appearance: Normal appearance.  HENT:     Head: Atraumatic.     Mouth/Throat:     Mouth: Mucous membranes are moist.     Pharynx: Posterior oropharyngeal erythema present.     Comments: Right lower molar region erythematous, edematous, poor dentition in this area Eyes:     Extraocular Movements: Extraocular movements intact.     Conjunctiva/sclera: Conjunctivae normal.  Cardiovascular:     Rate and Rhythm: Normal rate and regular rhythm.  Pulmonary:     Effort: Pulmonary effort is normal.     Breath sounds: Normal breath sounds.  Musculoskeletal:        General: Normal range of motion.     Cervical back: Normal range of motion and neck supple.  Lymphadenopathy:     Cervical: No cervical adenopathy.  Skin:    General: Skin is warm and dry.  Neurological:     General: No focal deficit present.     Mental Status: He is oriented to person, place, and time.  Psychiatric:        Mood and Affect: Mood normal.        Thought Content: Thought content normal.        Judgment: Judgment normal.     UC Treatments / Results  Labs (all labs ordered are listed, but only abnormal results are displayed) Labs Reviewed - No data to display  EKG   Radiology No results found.  Procedures Procedures (including critical care time)  Medications Ordered in UC Medications - No data to display  Initial Impression / Assessment and Plan / UC Course  I have reviewed the triage vital signs and the nursing notes.  Pertinent labs & imaging results that were available during my care of the patient were reviewed by me and considered in my medical decision making (see chart for details).     Treat with Augmentin, viscous lidocaine and  may use clove oil which she has been using.  Form completed for rehabilitation center per his request.  Follow-up with dentist as soon as possible.  Final Clinical Impressions(s) / UC Diagnoses   Final  diagnoses:  Dental infection   Discharge Instructions   None    ED Prescriptions     Medication Sig Dispense Auth. Provider   amoxicillin-clavulanate (AUGMENTIN) 875-125 MG tablet Take 1 tablet by mouth every 12 (twelve) hours. 14 tablet Particia Nearing, [place]   lidocaine (XYLOCAINE) 2 % solution Use as directed 5 mLs in the mouth or throat as needed for mouth pain. 100 mL Particia Nearing, [place]      PDMP not reviewed this encounter.   Particia Nearing, [place] 12/15/20 1448

## 2020-12-28 ENCOUNTER — Ambulatory Visit: Payer: Medicaid Other | Admitting: Physician Assistant

## 2020-12-28 ENCOUNTER — Encounter: Payer: Self-pay | Admitting: Physician Assistant

## 2020-12-28 ENCOUNTER — Other Ambulatory Visit: Payer: Self-pay

## 2020-12-28 VITALS — BP 111/75 | HR 92 | Temp 98.3°F | Ht 76.0 in | Wt 199.0 lb

## 2020-12-28 DIAGNOSIS — H019 Unspecified inflammation of eyelid: Secondary | ICD-10-CM

## 2020-12-28 DIAGNOSIS — Z8679 Personal history of other diseases of the circulatory system: Secondary | ICD-10-CM

## 2020-12-28 DIAGNOSIS — R931 Abnormal findings on diagnostic imaging of heart and coronary circulation: Secondary | ICD-10-CM

## 2020-12-28 DIAGNOSIS — Z1211 Encounter for screening for malignant neoplasm of colon: Secondary | ICD-10-CM

## 2020-12-28 DIAGNOSIS — R7989 Other specified abnormal findings of blood chemistry: Secondary | ICD-10-CM

## 2020-12-28 DIAGNOSIS — R7309 Other abnormal glucose: Secondary | ICD-10-CM

## 2020-12-28 DIAGNOSIS — F1911 Other psychoactive substance abuse, in remission: Secondary | ICD-10-CM

## 2020-12-28 DIAGNOSIS — Z7689 Persons encountering health services in other specified circumstances: Secondary | ICD-10-CM

## 2020-12-28 DIAGNOSIS — Z131 Encounter for screening for diabetes mellitus: Secondary | ICD-10-CM

## 2020-12-28 MED ORDER — CEPHALEXIN 500 MG PO CAPS: 500.0000 mg | ORAL_CAPSULE | Freq: Four times a day (QID) | ORAL | 0 refills | Status: AC

## 2020-12-28 NOTE — Progress Notes (Signed)
BP 111/75   Pulse 92   Temp 98.3 F (36.8 C)   Ht 6\' 4"  (1.93 m)   Wt 199 lb (90.3 kg)   SpO2 98%   BMI 24.22 kg/m    Subjective:    Patient ID: , male    DOB: [birth year], 45 y.o.   MRN: [id number]  HPI: Logan Fisher is a 45 y.o. male presenting on 12/28/2020 for New Patient (Initial Visit)   HPI  Chief Complaint  Patient presents with   New Patient (Initial Visit)    Pt has been At [facility] for about 2 wk.  He was living in [place] prior to that.   He is doing well and hasn't used drugs in over a month.   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  He says he had a Left retina detachment in April with surgical repair in [place].  He says he is all finished with follow ups for that.    He had osteomyelitis of the sternum in 2021 which required surgery and an extended hospitalization.   He was more recently inpatient in June with bacteremia.    Pt had TEE during that hospitalization and endocarditis could not be excluded but there were some abnormalities with recommendation to follow up with cardiology.    These two hospitalizations were resultant of pt's IVDU.      Relevant past medical, surgical, family and social history reviewed and updated as indicated. Interim medical history since our last visit reviewed. Allergies and medications reviewed and updated.  No current outpatient medications on file.     Review of Systems  Per HPI unless specifically indicated above     Objective:    BP 111/75   Pulse 92   Temp 98.3 F (36.8 C)   Ht 6\' 4"  (1.93 m)   Wt 199 lb (90.3 kg)   SpO2 98%   BMI 24.22 kg/m   Wt Readings from Last 3 Encounters:  12/28/20 199 lb (90.3 kg)  08/16/20 185 lb 3 oz (84 kg)  06/16/20 185 lb 3 oz (84 kg)    Physical Exam Vitals reviewed.  Constitutional:      General: He is not in acute distress.    Appearance: He is well-developed. He is not ill-appearing.   HENT:     Head: Normocephalic and atraumatic.     Right Ear: Tympanic membrane, ear canal and external ear normal.     Left Ear: Tympanic membrane, ear canal and external ear normal.  Eyes:     Extraocular Movements: Extraocular movements intact.     Conjunctiva/sclera: Conjunctivae normal.     Pupils: Pupils are equal, round, and reactive to light.     Comments: Right upper lid with what appears to be a small abrasion adjacent to reddish irritated skin with some flaking.  Neck:     Thyroid: No thyromegaly.  Cardiovascular:     Rate and Rhythm: Normal rate and regular rhythm.  Pulmonary:     Effort: Pulmonary effort is normal.     Breath sounds: Normal breath sounds. No wheezing or rales.  Abdominal:     General: Bowel sounds are normal.     Palpations: Abdomen is soft. There is no mass.     Tenderness: There is no abdominal tenderness.  Musculoskeletal:     Cervical back: Neck supple.     Right lower leg: No edema.  Left lower leg: No edema.  Lymphadenopathy:     Cervical: No cervical adenopathy.  Skin:    General: Skin is warm and dry.     Findings: No rash.     Comments: Extensive scarring BUE from previous IVDU.  Well-healed scar with hollowing out at superior end of sternum.   Neurological:     Mental Status: He is alert and oriented to person, place, and time.  Psychiatric:        Attention and Perception: Attention normal.        Mood and Affect: Affect is not inappropriate.        Speech: Speech normal.        Behavior: Behavior normal. Behavior is cooperative.           Assessment & Plan:    Encounter Diagnoses  Name Primary?   Encounter to establish care Yes   Elevated glucose    Screening for diabetes mellitus    Elevated platelet count    Screening for colon cancer    Substance abuse in remission (HCC)    Abnormal echocardiogram    History of endocarditis    Infective dermatitis of eyelid      - will check A1c and Cbc -Refer to cardiology  for for f/u endocarditis -for his Eyelid thing- rx keflex -Detached retina- he says he is done with follow ups.  He Needs optometrist and will sign up thru [facility] that he has worked with in the past -will put on Dental list -pt is given [facility]/application for [employer] financial assistance -pt is given FIT test for colon cancer screening -Pt got first covid shot and plans to get second -pt to follow up 1 month.  He is to contact office sooner prn

## 2020-12-29 ENCOUNTER — Other Ambulatory Visit: Payer: Self-pay | Admitting: Physician Assistant

## 2020-12-29 ENCOUNTER — Other Ambulatory Visit: Payer: Self-pay

## 2020-12-29 ENCOUNTER — Other Ambulatory Visit (HOSPITAL_COMMUNITY)
Admission: RE | Admit: 2020-12-29 | Discharge: 2020-12-29 | Disposition: A | Discharge status: Home / Self Care | Payer: Medicaid Other | Source: Ambulatory Visit | Attending: Physician Assistant | Admitting: Physician Assistant

## 2020-12-29 DIAGNOSIS — R7989 Other specified abnormal findings of blood chemistry: Secondary | ICD-10-CM | POA: Insufficient documentation

## 2020-12-29 DIAGNOSIS — R7309 Other abnormal glucose: Secondary | ICD-10-CM | POA: Insufficient documentation

## 2020-12-29 DIAGNOSIS — Z1211 Encounter for screening for malignant neoplasm of colon: Secondary | ICD-10-CM

## 2020-12-29 DIAGNOSIS — Z131 Encounter for screening for diabetes mellitus: Secondary | ICD-10-CM | POA: Insufficient documentation

## 2020-12-29 LAB — CBC
HCT: 47.6 % (ref 39.0–52.0)
Hemoglobin: 15.7 g/dL (ref 13.0–17.0)
MCH: 27.9 pg (ref 26.0–34.0)
MCHC: 33 g/dL (ref 30.0–36.0)
MCV: 84.7 fL (ref 80.0–100.0)
Platelets: 269 10*3/uL (ref 150–400)
RBC: 5.62 MIL/uL (ref 4.22–5.81)
RDW: 15.1 % (ref 11.5–15.5)
WBC: 8.2 10*3/uL (ref 4.0–10.5)
nRBC: 0 % (ref 0.0–0.2)

## 2020-12-29 LAB — HEMOGLOBIN A1C
Hgb A1c MFr Bld: 6 % — ABNORMAL HIGH (ref 4.8–5.6)
Mean Plasma Glucose: 125.5 mg/dL

## 2021-01-18 ENCOUNTER — Ambulatory Visit: Payer: Medicaid Other | Admitting: Physician Assistant

## 2021-02-22 ENCOUNTER — Ambulatory Visit (INDEPENDENT_AMBULATORY_CARE_PROVIDER_SITE_OTHER): Payer: Self-pay | Admitting: Internal Medicine

## 2021-02-22 ENCOUNTER — Encounter: Payer: Self-pay | Admitting: Internal Medicine

## 2021-02-22 VITALS — BP 152/90 | HR 80 | Ht 77.0 in | Wt 212.0 lb

## 2021-02-22 DIAGNOSIS — E7800 Pure hypercholesterolemia, unspecified: Secondary | ICD-10-CM

## 2021-02-22 DIAGNOSIS — Z131 Encounter for screening for diabetes mellitus: Secondary | ICD-10-CM

## 2021-02-22 MED ORDER — ROSUVASTATIN CALCIUM 5 MG PO TABS: 5.0000 mg | ORAL_TABLET | Freq: Every day | ORAL | 3 refills | Status: AC

## 2021-02-22 NOTE — Patient Instructions (Addendum)
Medication Instructions:  Your physician has recommended you make the following change in your medication:  START Crestor 5 mg tablets daily  *If you need a refill on your cardiac medications before your next appointment, please call your pharmacy*   Lab Work: IN TWO MONTHS:  Lipomed  AIC If you have labs (blood work) drawn today and your tests are completely normal, you will receive your results only by: MyChart Message (if you have MyChart) OR A paper copy in the mail If you have any lab test that is abnormal or we need to change your treatment, we will call you to review the results.   Testing/Procedures: None   Follow-Up: At [facility], you and your health needs are our priority.  As part of our continuing mission to provide you with exceptional heart care, we have created designated Provider Care Teams.  These Care Teams include your primary Cardiologist (physician) and Advanced Practice Providers (APPs -  Physician Assistants and Nurse Practitioners) who all work together to provide you with the care you need, when you need it.  We recommend signing up for the patient portal called "MyChart".  Sign up information is provided on this After Visit Summary.  MyChart is used to connect with patients for Virtual Visits (Telemedicine).  Patients are able to view lab/test results, encounter notes, upcoming appointments, etc.  Non-urgent messages can be sent to your provider as well.   To learn more about what you can do with MyChart, go to [email or web address].    Your next appointment:   April   The format for your next appointment:   In Person  Provider:   Dietrich Pates, MD    Other Instructions

## 2021-02-22 NOTE — Progress Notes (Signed)
Cardiology Office Note   Date:  02/22/2021   ID:  Logan Fisher, DOB Apr 26, 1975, MRN [id number]  PCP:  Pcp, No  Cardiologist:   Dietrich Pates, MD   Pt presents for f/u of abnormal echo    History of Present Illness: Logan Fisher is a 45 y.o. male with a history of HTN, HL, GERD, IVDA  Pt was admitted in June 2022 with S marcescens bacteremia   TTE normal   TEE showed no obvious endocarditis but TV was thickened with prolapse of the septal leaflet, could not exclude recent endocarditis.  Pt treated for 6 wks given pulmonary emboli  PT denies F/C   APpetite good   Weight is going up. Pt denies CP  Breathing is good   No palpitatoins He is recovering addict   Went through rehab   Now 121 days without drugs Diet imprved from last summer       No outpatient medications have been marked as taking for the 02/22/21 encounter (Office Visit) with Pricilla Riffle, MD.     Allergies:   Patient has no known allergies.   Past Medical History:  Diagnosis Date   AC (acromioclavicular) joint bone spurs    Cellulitis    Drug abuse (HCC)    Environmental allergies    Hiatal hernia    Hypercholesteremia    Hypertension    Osteomyelitis (HCC) 2020   left clavicle    Past Surgical History:  Procedure Laterality Date   BUBBLE STUDY  08/20/2020   Procedure: BUBBLE STUDY;  Surgeon: Chrystie Nose, MD;  Location: [facility] ENDOSCOPY;  Service: Cardiovascular;;   EYE SURGERY Left    retina detachment   HERNIA REPAIR     IRRIGATION AND DEBRIDEMENT STERNOCLAVICULAR JOINT-STERNUM AND RIBS Left 01/24/2019   TEE WITHOUT CARDIOVERSION N/A 08/20/2020   Procedure: TRANSESOPHAGEAL ECHOCARDIOGRAM (TEE);  Surgeon: Chrystie Nose, MD;  Location: [facility] ENDOSCOPY;  Service: Cardiovascular;  Laterality: N/A;     Social History:  The patient  reports that he has never smoked. He has never used smokeless tobacco. He reports that he does not currently use alcohol. He reports that he does not currently use drugs  after having used the following drugs: Methamphetamines, IV, Cocaine, and Heroin.   Family History:  The patient's family history includes Depression in his mother and sister; Diabetes in his father; Heart disease in his father; Heart failure in his father; High Cholesterol in his father.    ROS:  Please see the history of present illness. All other systems are reviewed and  Negative to the above problem except as noted.    PHYSICAL EXAM: VS:  BP (!) 152/90    Pulse 80    Ht 6\' 5"  (1.956 m)    Wt 212 lb (96.2 kg)    SpO2 98%    BMI 25.14 kg/m   GEN: Well nourished, well developed, in no acute distress  HEENT: normal  Neck: no JVD, carotid bruits, Cardiac: RRR; no murmurs  No LE edema  Respiratory:  clear to auscultation bilaterally GI: soft, nontender, nondistended, + BS  No hepatomegaly  MS: no deformity Moving all extremities   Skin: warm and dry, no rash  Several scars   Neuro:  Strength and sensation are intact Psych: euthymic mood, full affect   EKG:  EKG is not  ordered today.   Lipid Panel    Component Value Date/Time   CHOL 119 08/17/2020 0123   TRIG 65 08/17/2020 0123  HDL 24 (L) 08/17/2020 0123   CHOLHDL 5.0 08/17/2020 0123   VLDL 13 08/17/2020 0123   LDLCALC 82 08/17/2020 0123      Wt Readings from Last 3 Encounters:  02/22/21 212 lb (96.2 kg)  12/28/20 199 lb (90.3 kg)  08/16/20 185 lb 3 oz (84 kg)      ASSESSMENT AND PLAN:  1  Hx bacteremia with abnormal echo   I have reveiwed images from TEE   No definitive vegetation.    Completed ABX   DOing well   No murmur on exam   Would follow clinically    2  HTN  Pt had been on meds in the past    Says he was heavier    Stopped when taking drugs   has not resumed BP at PCP recently was good   110/s I recomm getting a BP cuff and tracking  Will follow in April 2023  3  PAD   CT scans fro m2020 show mild atherosclerosis of aorta    Would Rx with 5 Crestor  Check lipomed, Lpa and APoB in 2 months  4   Endocrine  A1C was 6 in June with random glucose 144.  Discussed diet.   Current medicines are reviewed at length with the patient today.  The patient does not have concerns regarding medicines.  Signed, Dietrich Pates, MD  02/22/2021 9:09 AM    [facility] [address] [place], [place], [place]  [address] Phone: [phone]; Fax: [phone]

## 2021-06-09 NOTE — Progress Notes (Deleted)
NO show

## 2021-06-10 ENCOUNTER — Ambulatory Visit: Payer: Medicaid Other | Admitting: Internal Medicine

## 2022-06-11 LAB — EXTERNAL GENERIC LAB PROCEDURE: COLOGUARD: POSITIVE — AB

## 2022-08-07 IMAGING — CT CT ABD-PELV W/ CM
2 of 5 series · 16 of 46 positions shown, 18 images · IV contrast (Omnipaque)
Comparison: None.

CLINICAL DATA: 45-year-old male with abdominal pain. History of IV
drug use and osteomyelitis.

EXAM:
CT ABDOMEN AND PELVIS WITH CONTRAST
TECHNIQUE: Multidetector CT imaging of the abdomen and pelvis was performed
using the standard protocol following bolus administration of
intravenous contrast.
CONTRAST:  100mL OMNIPAQUE IOHEXOL 300 MG/ML  SOLN

[Series 2: axial st · axial · 0.84mm/px · z∈[+967,+1472]mm · 13 of 113 slices shown, 15 images]
[im 6/113  soft-tissue]
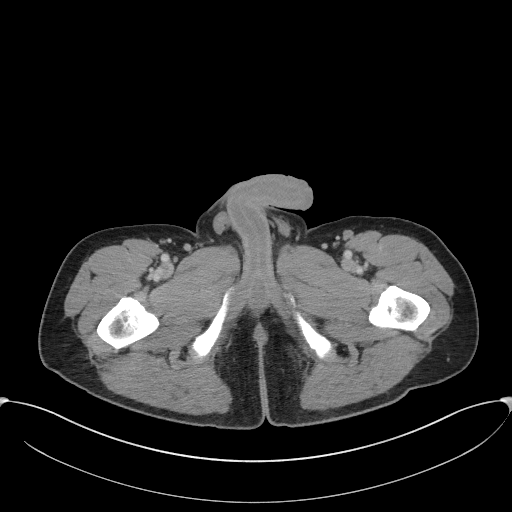
[im 6/113  bone]
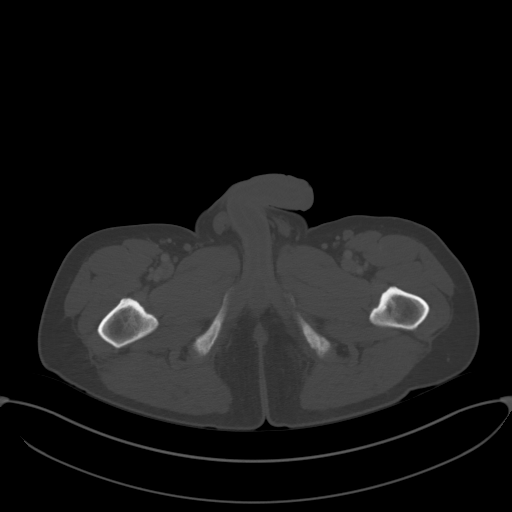
[im 18/113  soft-tissue]
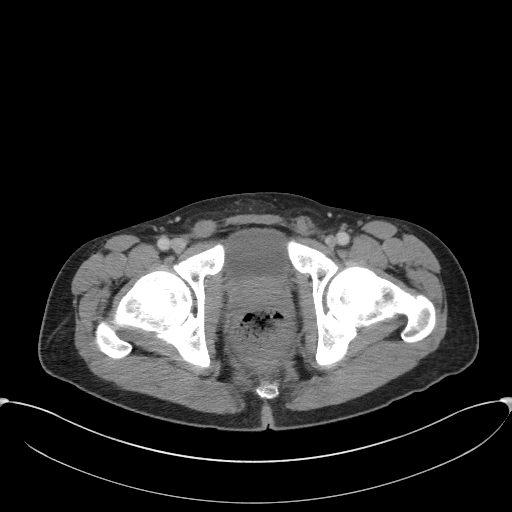
[im 24/113  soft-tissue]
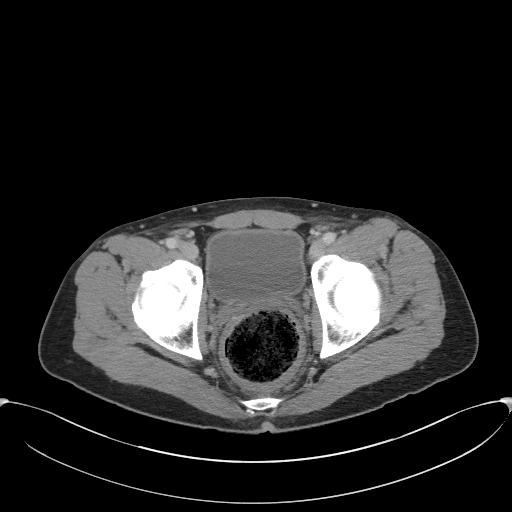
[im 30/113  soft-tissue]
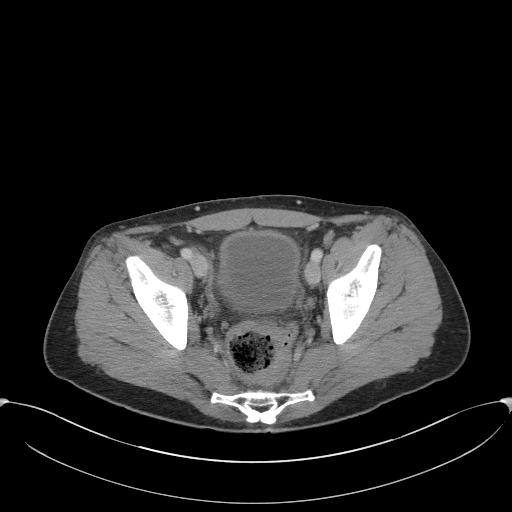
[im 42/113  soft-tissue]
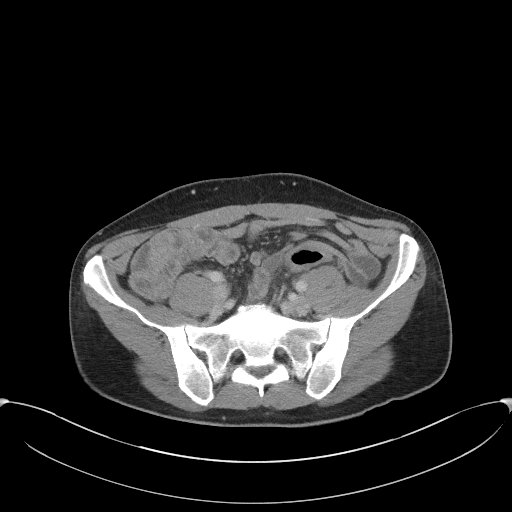
[im 48/113  soft-tissue]
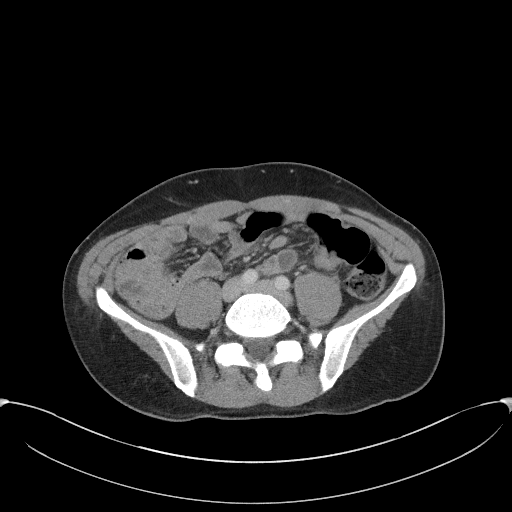
[im 59/113  soft-tissue]
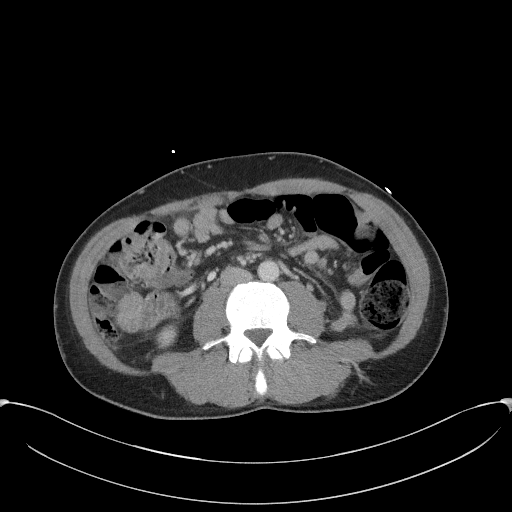
[im 65/113  soft-tissue]
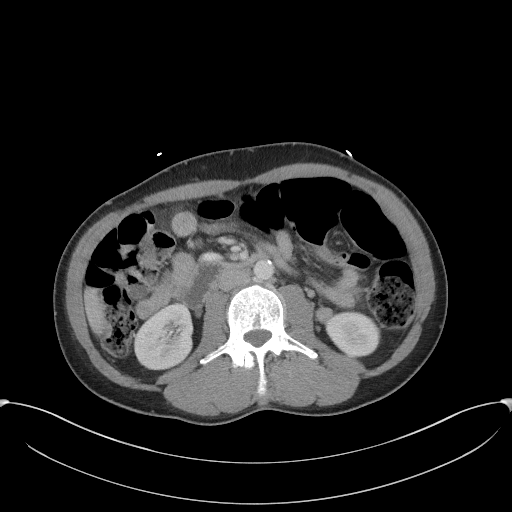
[im 71/113  soft-tissue]
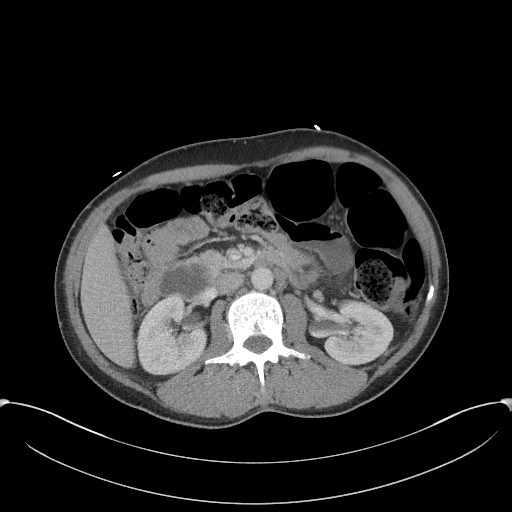
[im 71/113  bone]
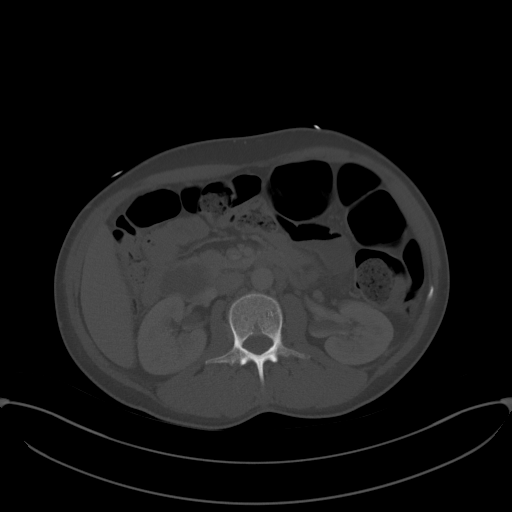
[im 83/113  soft-tissue]
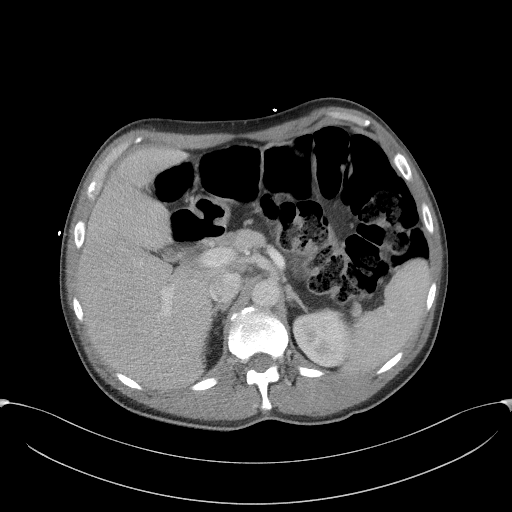
[im 89/113  soft-tissue]
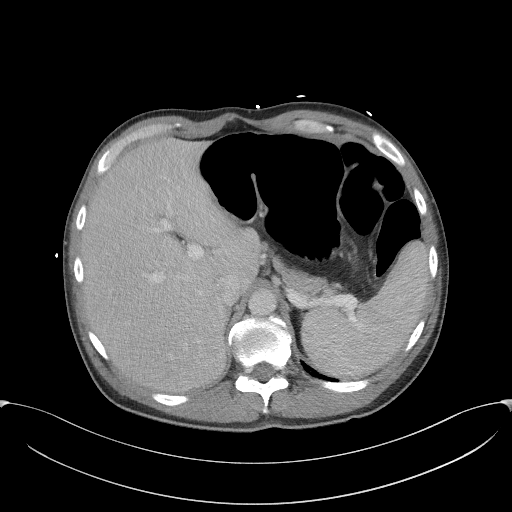
[im 95/113  soft-tissue]
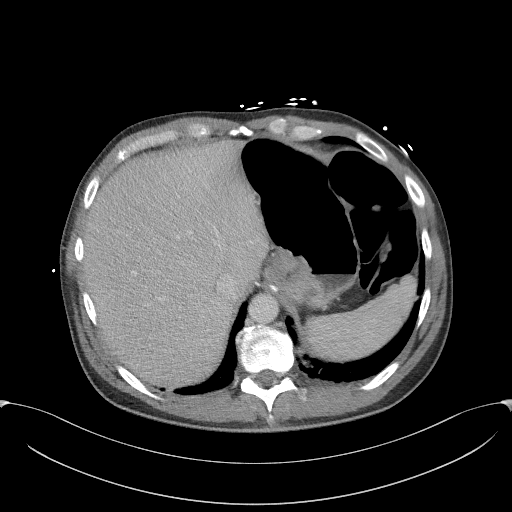
[im 107/113  soft-tissue]
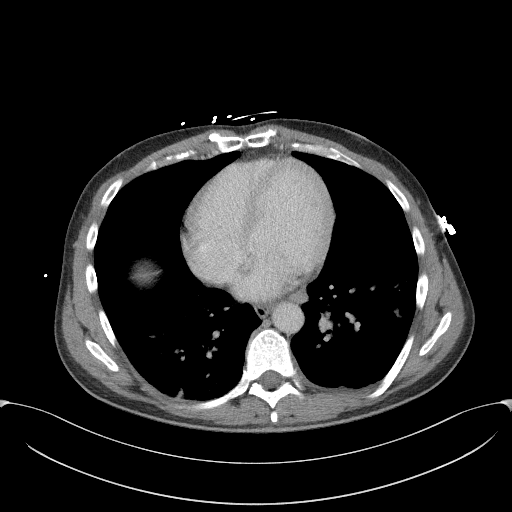

[Series 5: coronal st · coronal · 0.84mm/px · 3 of 101 slices shown]
[im 34/101  soft-tissue]
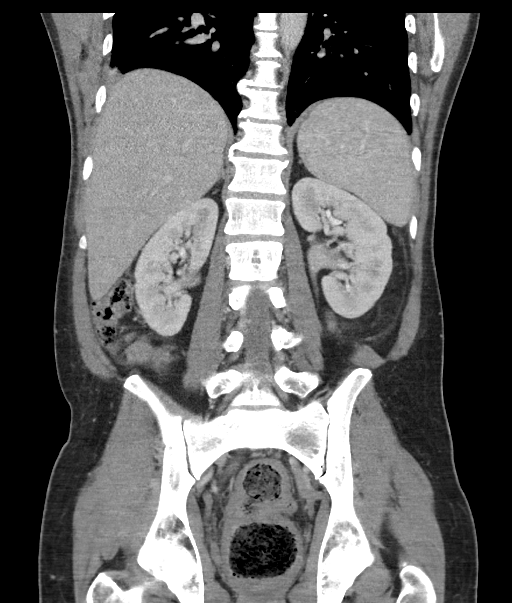
[im 45/101  soft-tissue]
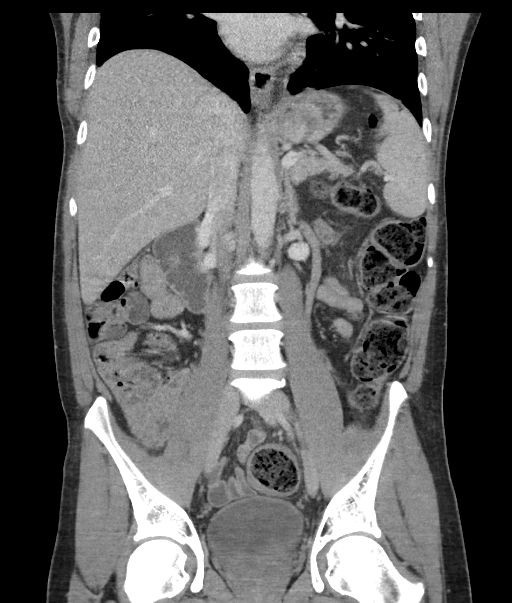
[im 56/101  soft-tissue]
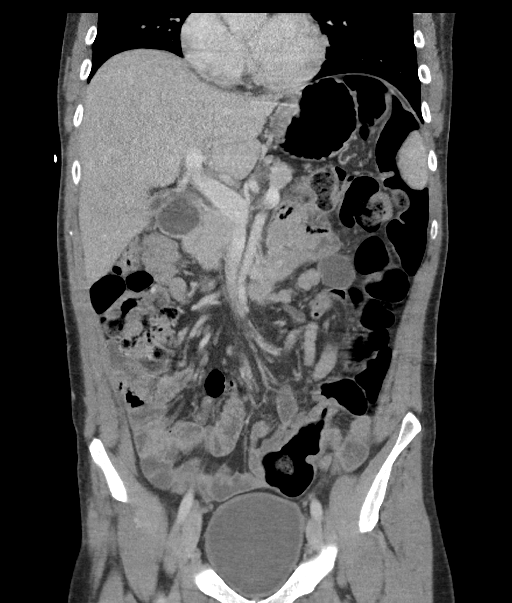

[16 of 46 positions shown; findings below may reference images not displayed]

FINDINGS: Lower chest: Several scattered bilateral pulmonary nodules
suspicious for septic emboli in this patient with history of IV drug
use. Clinical correlation is recommended. Chest CT may provide
better evaluation if clinically indicated.

No intra-abdominal free air or free fluid.

Hepatobiliary: No focal liver abnormality is seen. No gallstones,
gallbladder wall thickening, or biliary dilatation.

Pancreas: Unremarkable. No pancreatic ductal dilatation or
surrounding inflammatory changes.

Spleen: Normal in size without focal abnormality.

Adrenals/Urinary Tract: The adrenal glands unremarkable. The
kidneys, visualized ureters, and urinary bladder appear
unremarkable.

Stomach/Bowel: Postsurgical changes of Nissen fundoplication. Mild
thickened appearance of the rectosigmoid, likely related to
underdistention. Active inflammation is less likely. There is no
bowel obstruction. The appendix is not visualized with certainty. No
inflammatory changes identified in the right lower quadrant.

Vascular/Lymphatic: Mild atherosclerotic calcification of the
abdominal aorta. The IVC is unremarkable. No portal venous gas.
There is no adenopathy. There is mild diffuse mesenteric stranding.

Reproductive: The prostate and seminal vesicles are grossly
unremarkable.

Other: None

Musculoskeletal: No acute or significant osseous findings.
IMPRESSION: 1. Several scattered bilateral pulmonary nodules suspicious for
septic emboli.
2. Mild thickened appearance of the rectosigmoid, likely related to
underdistention.
3. No bowel obstruction.
4. Aortic Atherosclerosis (24PMM-PI2.2).

## 2023-03-23 ENCOUNTER — Encounter (HOSPITAL_BASED_OUTPATIENT_CLINIC_OR_DEPARTMENT_OTHER): Payer: Self-pay | Admitting: Emergency Medicine

## 2023-03-23 ENCOUNTER — Other Ambulatory Visit: Payer: Self-pay

## 2023-03-23 ENCOUNTER — Emergency Department (HOSPITAL_BASED_OUTPATIENT_CLINIC_OR_DEPARTMENT_OTHER)
Admission: EM | Admit: 2023-03-23 | Discharge: 2023-03-23 | Disposition: A | Discharge status: Home / Self Care | Payer: BC Managed Care – PPO | Attending: Emergency Medicine | Admitting: Emergency Medicine

## 2023-03-23 DIAGNOSIS — H43811 Vitreous degeneration, right eye: Secondary | ICD-10-CM | POA: Diagnosis not present

## 2023-03-23 DIAGNOSIS — H5789 Other specified disorders of eye and adnexa: Secondary | ICD-10-CM | POA: Diagnosis present

## 2023-03-23 HISTORY — DX: Serous retinal detachment, unspecified eye: H33.20

## 2023-03-23 MED ORDER — TETRACAINE HCL 0.5 % OP SOLN
2.0000 [drp] | Freq: Once | OPHTHALMIC | Status: AC
Start: 2023-03-23 — End: 2023-03-23
  Administered 2023-03-23: 2 [drp] via OPHTHALMIC
  Filled 2023-03-23: qty 4

## 2023-03-23 MED ORDER — FLUORESCEIN SODIUM 1 MG OP STRP
1.0000 | ORAL_STRIP | Freq: Once | OPHTHALMIC | Status: AC
Start: 2023-03-23 — End: 2023-03-23
  Administered 2023-03-23: 1 via OPHTHALMIC
  Filled 2023-03-23: qty 1

## 2023-03-23 NOTE — ED Provider Notes (Signed)
[facility] AT [facility] Provider Note   CSN: [id number] Arrival date & time: 03/23/23  1345     History  Chief Complaint  Patient presents with   Eye Problem    Logan Fisher is a 48 y.o. male.  With a remote history of IV drug use and history of left retinal detachment status postrepair presenting for vision changes.  Patient first experienced "black spots" floating through his right eye last night while watching TV.  He considered this to be mild at the time and did not think much of it.  Today while driving around 2956 he began to experience bigger black spots in his right eye and felt a dark wavy vision in the right eye as well.  This seems to be more prominent when he glances from left to right only in the right eye.  No eye pain or injury.  Left eye is unaffected and at baseline.  Overall the vision in his right eye is grossly normal and he was able to drive without great difficulty here.  No fevers chills or recent illness.  Previous retinal detachment repair was done by Dr. Allyne Gee here in [facility] Problem      Home Medications Prior to Admission medications   Medication Sig Start Date End Date Taking? Authorizing Provider  rosuvastatin (CRESTOR) 5 MG tablet Take 1 tablet (5 mg total) by mouth daily. 02/22/21   Pricilla Riffle, MD      Allergies    Patient has no known allergies.    Review of Systems   Review of Systems  Physical Exam Updated Vital Signs BP (!) 159/103 (BP Location: Left Arm)   Pulse (!) 126   Temp 98.2 F (36.8 C)   Resp 16   Ht 6\' 5"  (1.956 m)   Wt 99.8 kg   SpO2 100%   BMI 26.09 kg/m  Physical Exam Vitals and nursing note reviewed.  HENT:     Head: Normocephalic and atraumatic.  Eyes:     Pupils: Pupils are equal, round, and reactive to light.     Comments: Visual acuities OD 20/15 OS 20/40 both 20/15 Intraocular pressures Left and right both 18 mmHg No fluorescein uptake in the right eye on  exam Visual fields grossly intact  Cardiovascular:     Rate and Rhythm: Normal rate and regular rhythm.  Pulmonary:     Effort: Pulmonary effort is normal.     Breath sounds: Normal breath sounds.  Abdominal:     Palpations: Abdomen is soft.     Tenderness: There is no abdominal tenderness.  Skin:    General: Skin is warm and dry.  Neurological:     Mental Status: He is alert.  Psychiatric:        Mood and Affect: Mood normal.     ED Results / Procedures / Treatments   Labs (all labs ordered are listed, but only abnormal results are displayed) Labs Reviewed - No data to display  EKG None  Radiology No results found.  Procedures Ultrasound ED Ocular  Date/Time: 03/23/2023 3:50 PM  Performed by: Royanne Foots, DO Authorized by: Royanne Foots, DO   PROCEDURE DETAILS:    Indications: visual change     Assessed:  Right eye   Right eye axial view: obtained     Right eye sagittal view: obtained     Images: archived     Limitations:  None RIGHT EYE FINDINGS:  no foreign body noted in right eye    right eye lens not dislodged    no right eye increased optic nerve sheath diameter    evidence of retinal detachment of the right eye Comments:     Retinal versus vitreous detachment based on ultrasound of the right eye     Medications Ordered in ED Medications  fluorescein ophthalmic strip 1 strip (1 strip Both Eyes Given 03/23/23 1419)  tetracaine (PONTOCAINE) 0.5 % ophthalmic solution 2 drop (2 drops Both Eyes Given 03/23/23 1418)    ED Course/ Medical Decision Making/ A&P                                 Medical Decision Making 48 year old male with history of left retinal detachment status postrepair now coming in with right eye vision changes.  Has had floaters in the right eye that started last night and acutely worsened today around 1230.  Vision in the right eye is unaffected and there is no eye pain.  Normal intraocular pressures.  No fluorescein uptake  on exam.  Bedside ocular ultrasound concerning for potential retinal versus vitreous detachment.  I discussed this with ophthalmologist on-call Dr.Groat who shares concern for this potential pathology and wants to evaluate the patient immediately in his office.  We have made arrangements for this however in the interim.  The patient himself was able to get in touch with Dr. Allyne Gee office who previously operated on his left retinal detachment.  He has elected to go directly from here to Dr. Allyne Gee office.  I wanted him to wait for a friend or family member to pick him up and drive him but the patient understandably wanted to get there soon as possible and left the department prior to formal discharge to drive himself despite my request  Risk Prescription drug management.           Final Clinical Impression(s) / ED Diagnoses Final diagnoses:  Vitreous detachment of right eye    Rx / DC Orders ED Discharge Orders     None         Royanne Foots, DO 03/23/23 1554

## 2023-03-23 NOTE — Discharge Instructions (Addendum)
You were seen in the emergency department for right eye vision changes You were evaluated here and we talked to Dr.Groat the ophthalmologist on-call He wants to see you in his office to evaluate you immediately Please go there directly after your discharge from the emergency department for further evaluation

## 2023-03-23 NOTE — ED Notes (Signed)
EDP believes Pt. Has a ret. detachment

## 2023-03-23 NOTE — ED Triage Notes (Signed)
Pt reports hx of left retinal detachment in 2022, this morning started seeing tiny black floaters in his rt eye, went to UC and was sent here for eva, denies visual changes
